# Patient Record
Sex: Female | Born: 1980 | Race: White | Hispanic: No | Marital: Married | Smoking: Never smoker
Health system: Southern US, Community
[De-identification: ages and names within clinical notes are randomized; demographics above are authoritative.]

## PROBLEM LIST (undated history)

## (undated) ENCOUNTER — Inpatient Hospital Stay (HOSPITAL_COMMUNITY): Payer: Self-pay

## (undated) DIAGNOSIS — O24419 Gestational diabetes mellitus in pregnancy, unspecified control: Secondary | ICD-10-CM

## (undated) DIAGNOSIS — E282 Polycystic ovarian syndrome: Secondary | ICD-10-CM

## (undated) DIAGNOSIS — E119 Type 2 diabetes mellitus without complications: Secondary | ICD-10-CM

## (undated) DIAGNOSIS — T7840XA Allergy, unspecified, initial encounter: Secondary | ICD-10-CM

## (undated) DIAGNOSIS — I10 Essential (primary) hypertension: Secondary | ICD-10-CM

## (undated) DIAGNOSIS — N390 Urinary tract infection, site not specified: Secondary | ICD-10-CM

## (undated) DIAGNOSIS — B379 Candidiasis, unspecified: Secondary | ICD-10-CM

## (undated) DIAGNOSIS — M51369 Other intervertebral disc degeneration, lumbar region without mention of lumbar back pain or lower extremity pain: Secondary | ICD-10-CM

## (undated) DIAGNOSIS — M5136 Other intervertebral disc degeneration, lumbar region: Secondary | ICD-10-CM

## (undated) DIAGNOSIS — M549 Dorsalgia, unspecified: Secondary | ICD-10-CM

## (undated) DIAGNOSIS — Q069 Congenital malformation of spinal cord, unspecified: Secondary | ICD-10-CM

## (undated) DIAGNOSIS — Z87442 Personal history of urinary calculi: Secondary | ICD-10-CM

## (undated) DIAGNOSIS — R51 Headache: Secondary | ICD-10-CM

## (undated) DIAGNOSIS — M5126 Other intervertebral disc displacement, lumbar region: Secondary | ICD-10-CM

## (undated) HISTORY — DX: Allergy, unspecified, initial encounter: T78.40XA

## (undated) HISTORY — PX: CHOLECYSTECTOMY: SHX55

## (undated) HISTORY — PX: HERNIA REPAIR: SHX51

## (undated) HISTORY — DX: Polycystic ovarian syndrome: E28.2

## (undated) HISTORY — DX: Type 2 diabetes mellitus without complications: E11.9

## (undated) HISTORY — PX: TONSILLECTOMY: SUR1361

## (undated) HISTORY — PX: WISDOM TOOTH EXTRACTION: SHX21

---

## 2004-03-03 ENCOUNTER — Emergency Department (HOSPITAL_COMMUNITY): Admission: EM | Admit: 2004-03-03 | Discharge: 2004-03-03 | Payer: Self-pay | Admitting: Emergency Medicine

## 2008-12-19 ENCOUNTER — Emergency Department (HOSPITAL_COMMUNITY): Admission: EM | Admit: 2008-12-19 | Discharge: 2008-12-19 | Payer: Self-pay | Admitting: Emergency Medicine

## 2008-12-22 ENCOUNTER — Encounter: Admission: RE | Admit: 2008-12-22 | Discharge: 2008-12-22 | Payer: Self-pay | Admitting: Emergency Medicine

## 2009-06-22 ENCOUNTER — Emergency Department (HOSPITAL_COMMUNITY): Admission: EM | Admit: 2009-06-22 | Discharge: 2009-06-22 | Payer: Self-pay | Admitting: Emergency Medicine

## 2009-07-25 ENCOUNTER — Ambulatory Visit (HOSPITAL_COMMUNITY): Admission: RE | Admit: 2009-07-25 | Discharge: 2009-07-25 | Payer: Self-pay | Admitting: Obstetrics & Gynecology

## 2009-11-29 ENCOUNTER — Emergency Department (HOSPITAL_COMMUNITY): Admission: EM | Admit: 2009-11-29 | Discharge: 2009-11-29 | Payer: Self-pay | Admitting: Emergency Medicine

## 2010-02-18 ENCOUNTER — Inpatient Hospital Stay (HOSPITAL_COMMUNITY): Admission: AD | Admit: 2010-02-18 | Discharge: 2010-02-18 | Payer: Self-pay | Admitting: Obstetrics and Gynecology

## 2010-03-20 ENCOUNTER — Observation Stay: Payer: Self-pay | Admitting: Obstetrics and Gynecology

## 2010-04-21 ENCOUNTER — Encounter (INDEPENDENT_AMBULATORY_CARE_PROVIDER_SITE_OTHER): Payer: Self-pay | Admitting: Obstetrics & Gynecology

## 2010-04-21 ENCOUNTER — Inpatient Hospital Stay (HOSPITAL_COMMUNITY): Admission: AD | Admit: 2010-04-21 | Discharge: 2010-04-23 | Payer: Self-pay | Admitting: Obstetrics & Gynecology

## 2010-06-30 ENCOUNTER — Encounter: Payer: Self-pay | Admitting: Emergency Medicine

## 2010-08-19 LAB — CBC
HCT: 29.4 % — ABNORMAL LOW (ref 36.0–46.0)
HCT: 33.9 % — ABNORMAL LOW (ref 36.0–46.0)
Hemoglobin: 10 g/dL — ABNORMAL LOW (ref 12.0–15.0)
Hemoglobin: 11.6 g/dL — ABNORMAL LOW (ref 12.0–15.0)
MCH: 31.9 pg (ref 26.0–34.0)
MCH: 32.4 pg (ref 26.0–34.0)
MCHC: 34 g/dL (ref 30.0–36.0)
MCHC: 34.2 g/dL (ref 30.0–36.0)
MCV: 93.6 fL (ref 78.0–100.0)
MCV: 94.7 fL (ref 78.0–100.0)
Platelets: 155 10*3/uL (ref 150–400)
Platelets: 164 10*3/uL (ref 150–400)
RBC: 3.1 MIL/uL — ABNORMAL LOW (ref 3.87–5.11)
RBC: 3.62 MIL/uL — ABNORMAL LOW (ref 3.87–5.11)
RDW: 13.9 % (ref 11.5–15.5)
RDW: 14.2 % (ref 11.5–15.5)
WBC: 11 10*3/uL — ABNORMAL HIGH (ref 4.0–10.5)
WBC: 16.6 10*3/uL — ABNORMAL HIGH (ref 4.0–10.5)

## 2010-08-19 LAB — RPR: RPR Ser Ql: NONREACTIVE

## 2010-08-21 LAB — URINALYSIS, ROUTINE W REFLEX MICROSCOPIC
Bilirubin Urine: NEGATIVE
Glucose, UA: NEGATIVE mg/dL
Ketones, ur: NEGATIVE mg/dL
Leukocytes, UA: NEGATIVE
Nitrite: NEGATIVE
Protein, ur: NEGATIVE mg/dL
Specific Gravity, Urine: 1.015 (ref 1.005–1.030)
Urobilinogen, UA: 0.2 mg/dL (ref 0.0–1.0)
pH: 6.5 (ref 5.0–8.0)

## 2010-08-21 LAB — URINE CULTURE
Colony Count: 35000
Culture  Setup Time: 201109140233

## 2010-08-21 LAB — COMPREHENSIVE METABOLIC PANEL
ALT: 17 U/L (ref 0–35)
AST: 15 U/L (ref 0–37)
Albumin: 3 g/dL — ABNORMAL LOW (ref 3.5–5.2)
Alkaline Phosphatase: 92 U/L (ref 39–117)
BUN: 8 mg/dL (ref 6–23)
CO2: 25 mEq/L (ref 19–32)
Calcium: 9.7 mg/dL (ref 8.4–10.5)
Chloride: 102 mEq/L (ref 96–112)
Creatinine, Ser: 0.48 mg/dL (ref 0.4–1.2)
Glucose, Bld: 85 mg/dL (ref 70–99)
Potassium: 3.5 mEq/L (ref 3.5–5.1)
Sodium: 135 mEq/L (ref 135–145)
Total Bilirubin: 0.5 mg/dL (ref 0.3–1.2)
Total Protein: 6.5 g/dL (ref 6.0–8.3)

## 2010-08-21 LAB — FETAL FIBRONECTIN: Fetal Fibronectin: NEGATIVE

## 2010-08-21 LAB — CBC
HCT: 34.5 % — ABNORMAL LOW (ref 36.0–46.0)
Hemoglobin: 11.9 g/dL — ABNORMAL LOW (ref 12.0–15.0)
MCH: 32.2 pg (ref 26.0–34.0)
MCHC: 34.5 g/dL (ref 30.0–36.0)
MCV: 93.4 fL (ref 78.0–100.0)
Platelets: 174 10*3/uL (ref 150–400)
RBC: 3.69 MIL/uL — ABNORMAL LOW (ref 3.87–5.11)
RDW: 14 % (ref 11.5–15.5)
WBC: 10.6 10*3/uL — ABNORMAL HIGH (ref 4.0–10.5)

## 2010-08-21 LAB — URINE MICROSCOPIC-ADD ON

## 2010-08-24 LAB — POCT I-STAT, CHEM 8
BUN: 12 mg/dL (ref 6–23)
Calcium, Ion: 1.1 mmol/L — ABNORMAL LOW (ref 1.12–1.32)
Chloride: 105 mEq/L (ref 96–112)
Creatinine, Ser: 0.6 mg/dL (ref 0.4–1.2)
Glucose, Bld: 110 mg/dL — ABNORMAL HIGH (ref 70–99)
HCT: 42 % (ref 36.0–46.0)
Hemoglobin: 14.3 g/dL (ref 12.0–15.0)
Potassium: 4.1 mEq/L (ref 3.5–5.1)
Sodium: 138 mEq/L (ref 135–145)
TCO2: 27 mmol/L (ref 0–100)

## 2010-08-24 LAB — CBC
HCT: 41.5 % (ref 36.0–46.0)
Hemoglobin: 14.3 g/dL (ref 12.0–15.0)
MCHC: 34.4 g/dL (ref 30.0–36.0)
MCV: 90.5 fL (ref 78.0–100.0)
Platelets: 203 10*3/uL (ref 150–400)
RBC: 4.59 MIL/uL (ref 3.87–5.11)
RDW: 12.6 % (ref 11.5–15.5)
WBC: 9.1 10*3/uL (ref 4.0–10.5)

## 2010-08-24 LAB — DIFFERENTIAL
Basophils Absolute: 0.1 10*3/uL (ref 0.0–0.1)
Basophils Relative: 1 % (ref 0–1)
Eosinophils Absolute: 0.1 10*3/uL (ref 0.0–0.7)
Eosinophils Relative: 1 % (ref 0–5)
Lymphocytes Relative: 24 % (ref 12–46)
Lymphs Abs: 2.2 10*3/uL (ref 0.7–4.0)
Monocytes Absolute: 0.5 10*3/uL (ref 0.1–1.0)
Monocytes Relative: 6 % (ref 3–12)
Neutro Abs: 6.2 10*3/uL (ref 1.7–7.7)
Neutrophils Relative %: 68 % (ref 43–77)

## 2010-08-24 LAB — URINALYSIS, ROUTINE W REFLEX MICROSCOPIC
Bilirubin Urine: NEGATIVE
Glucose, UA: NEGATIVE mg/dL
Ketones, ur: NEGATIVE mg/dL
Leukocytes, UA: NEGATIVE
Nitrite: NEGATIVE
Protein, ur: NEGATIVE mg/dL
Specific Gravity, Urine: 1.027 (ref 1.005–1.030)
Urobilinogen, UA: 0.2 mg/dL (ref 0.0–1.0)
pH: 6 (ref 5.0–8.0)

## 2010-08-24 LAB — URINE MICROSCOPIC-ADD ON

## 2010-08-24 LAB — HCG, QUANTITATIVE, PREGNANCY: hCG, Beta Chain, Quant, S: <2 m[IU]/mL (ref <5)

## 2010-08-24 LAB — ABO/RH: ABO/RH(D): A POS

## 2010-08-24 LAB — POCT PREGNANCY, URINE: Preg Test, Ur: NEGATIVE

## 2011-03-09 ENCOUNTER — Emergency Department: Payer: Self-pay | Admitting: Emergency Medicine

## 2011-03-10 ENCOUNTER — Emergency Department: Payer: Self-pay | Admitting: Emergency Medicine

## 2012-09-01 ENCOUNTER — Emergency Department: Payer: Self-pay | Admitting: Emergency Medicine

## 2012-09-01 LAB — URINALYSIS, COMPLETE
Bilirubin,UR: NEGATIVE
Glucose,UR: NEGATIVE mg/dL (ref 0–75)
Ketone: NEGATIVE
Leukocyte Esterase: NEGATIVE
Nitrite: POSITIVE
Ph: 6 (ref 4.5–8.0)
Protein: NEGATIVE
RBC,UR: 63 /HPF (ref 0–5)
Specific Gravity: 1.026 (ref 1.003–1.030)
Squamous Epithelial: 11
WBC UR: 14 /HPF (ref 0–5)

## 2012-09-01 LAB — PREGNANCY, URINE: Pregnancy Test, Urine: NEGATIVE m[IU]/mL

## 2012-12-19 ENCOUNTER — Inpatient Hospital Stay (HOSPITAL_COMMUNITY)
Admission: AD | Admit: 2012-12-19 | Discharge: 2012-12-19 | Disposition: A | Discharge status: Home / Self Care | Payer: BC Managed Care – PPO | Source: Ambulatory Visit | Attending: Obstetrics and Gynecology | Admitting: Obstetrics and Gynecology

## 2012-12-19 ENCOUNTER — Encounter (HOSPITAL_COMMUNITY): Payer: Self-pay

## 2012-12-19 DIAGNOSIS — O219 Vomiting of pregnancy, unspecified: Secondary | ICD-10-CM

## 2012-12-19 DIAGNOSIS — O210 Mild hyperemesis gravidarum: Secondary | ICD-10-CM | POA: Insufficient documentation

## 2012-12-19 HISTORY — DX: Congenital malformation of spinal cord, unspecified: Q06.9

## 2012-12-19 HISTORY — DX: Candidiasis, unspecified: B37.9

## 2012-12-19 HISTORY — DX: Gestational diabetes mellitus in pregnancy, unspecified control: O24.419

## 2012-12-19 HISTORY — DX: Urinary tract infection, site not specified: N39.0

## 2012-12-19 LAB — URINALYSIS, ROUTINE W REFLEX MICROSCOPIC
Bilirubin Urine: NEGATIVE
Glucose, UA: NEGATIVE mg/dL
Ketones, ur: NEGATIVE mg/dL
Nitrite: NEGATIVE
Protein, ur: NEGATIVE mg/dL
Specific Gravity, Urine: 1.02 (ref 1.005–1.030)
Urobilinogen, UA: 1 mg/dL (ref 0.0–1.0)
pH: 6.5 (ref 5.0–8.0)

## 2012-12-19 LAB — URINE MICROSCOPIC-ADD ON

## 2012-12-19 MED ORDER — PROMETHAZINE HCL 25 MG RE SUPP: 25.0000 mg | Freq: Four times a day (QID) | RECTAL | Status: DC | PRN

## 2012-12-19 NOTE — MAU Note (Signed)
Patient presents to MAU with c/o unrelieved N/V since last Monday. Zofran/Phenergan not helping. Patient reports she is on Bactrim for UTI, and is concerned she is not keeping down meds.  Denies VB, LOF, cramping.

## 2012-12-19 NOTE — MAU Provider Note (Signed)
History     CSN: [id number]  Arrival date and time: 12/19/12 1157   None     Chief Complaint  Patient presents with  . Emesis   HPI Amber Foster is 32 y.o. G3P2002 103w6d weeks presenting with nausea and vomiting at [redacted]w[redacted]d gestation.  She is a patient of Dr. Langston Foster.  She was seen last 7/11 in the office.  Given Rx for Zofran and Macrobid.  Has vomited X 4 today.  She is concerned that the antibiotic is not staying down.  The vomiting doesn't necessarily occur with taking the medications,rather at random times of the day.   Vomited X 8 Friday, 2X Saturday, and didn't allow herself to vomit.  Mild headache.  Denis vaginal sxs.  Culture pending.  She called the office and was instructed to come here for evaluation.  She has had Ultrasound in the office that showed an IUP.  Dr. Marcelle Overlie is in call.      No past medical history on file.  No past surgical history on file.  No family history on file.  History  Substance Use Topics  . Smoking status: Not on file  . Smokeless tobacco: Not on file  . Alcohol Use: Not on file    Allergies: Allergies not on file  No prescriptions prior to admission    Review of Systems  Constitutional: Negative for fever and chills.  Gastrointestinal: Positive for nausea and vomiting. Negative for abdominal pain.  Genitourinary:       Neg for vaginal bleeding or discharge  Neurological: Positive for headaches (mild).   Physical Exam   Blood pressure 122/59, pulse 58, temperature 98.4 F (36.9 C), temperature source Oral, resp. rate 16, height 5' 3.5" (1.613 m), weight 166 lb 12.8 oz (75.66 kg), SpO2 100.00%.  Physical Exam  Constitutional: She is oriented to person, place, and time. She appears well-developed and well-nourished. No distress.  HENT:  Head: Normocephalic.  Genitourinary:  Not indicated  Neurological: She is alert and oriented to person, place, and time.  Psychiatric: She has a normal mood and affect. Her behavior is normal.    Results for orders placed during the hospital encounter of 12/19/12 (from the past 24 hour(s))  URINALYSIS, ROUTINE W REFLEX MICROSCOPIC     Status: Abnormal   Collection Time    12/19/12 12:26 PM      Result Value Range   Color, Urine YELLOW  YELLOW   APPearance CLEAR  CLEAR   Specific Gravity, Urine 1.020  1.005 - 1.030   pH 6.5  5.0 - 8.0   Glucose, UA NEGATIVE  NEGATIVE mg/dL   Hgb urine dipstick MODERATE (*) NEGATIVE   Bilirubin Urine NEGATIVE  NEGATIVE   Ketones, ur NEGATIVE  NEGATIVE mg/dL   Protein, ur NEGATIVE  NEGATIVE mg/dL   Urobilinogen, UA 1.0  0.0 - 1.0 mg/dL   Nitrite NEGATIVE  NEGATIVE   Leukocytes, UA TRACE (*) NEGATIVE  URINE MICROSCOPIC-ADD ON     Status: Abnormal   Collection Time    12/19/12 12:26 PM      Result Value Range   Squamous Epithelial / LPF FEW (*) RARE   WBC, UA 0-2  <3 WBC/hpf   RBC / HPF 3-6  <3 RBC/hpf   Urine-Other MUCOUS PRESENT     MAU Course  Procedures  MDM 13:00  Reported MSE and UA results to Dr. Marcelle Overlie.  I asked if he would check on the urine culture the office sent on Friday.  Her SG  and Ketones are normal.  He will call back with this information. 1 13:03  Informed patient of lab findings and that when Dr. Marcelle Overlie calls back, we will have a plan.  14:40  Dr. Marcelle Overlie called back to report Urine culture was + for E. Coli.  Offered IV ancef vs completion of Macrodantin.  I discussed the options with the patient.  She does not have Phenergan at home, only Zofran.  She states now that it is difficult to work and go to school with the nausea.  Her Mother-in-law and husband are home during the day and can help with her 2 young sons.  Suggested she try Phenergan to control nausea first.  If this doesn't help, may need IV antibiotic.  Instructed patient to talk to Dr. Langston Foster about work and school.  Offered work note for today, she is off today and does not need it.    Assessment and Plan  A:  Nausea and vomiting at [redacted]w[redacted]d       + urine  culture for E. COli resulted today in the office  P:  Encouraged patient to complete Macrodantin      Rx for Phenergan 25mg  suppositories to her pharmacy      Instructed to call Dr. Langston Foster for sxs that do not improve with addition of Phenergan  [name],EVE M 12/19/2012, 12:26 PM

## 2012-12-19 NOTE — MAU Note (Signed)
Patient states she has had her first prenatal appointment with [facility]. Has had nausea and vomiting for one week. Unable to keep anything down today. Has been taking Zofran but not working. Only has abdominal pain with vomiting. Denies bleeding or discharge. Extremely weak.

## 2012-12-26 LAB — OB RESULTS CONSOLE ABO/RH: RH Type: POSITIVE

## 2012-12-26 LAB — OB RESULTS CONSOLE HIV ANTIBODY (ROUTINE TESTING): HIV: NONREACTIVE

## 2012-12-26 LAB — OB RESULTS CONSOLE RPR: RPR: NONREACTIVE

## 2012-12-26 LAB — OB RESULTS CONSOLE GC/CHLAMYDIA
Chlamydia: NEGATIVE
Gonorrhea: NEGATIVE

## 2012-12-26 LAB — OB RESULTS CONSOLE ANTIBODY SCREEN: Antibody Screen: NEGATIVE

## 2012-12-26 LAB — OB RESULTS CONSOLE RUBELLA ANTIBODY, IGM: Rubella: IMMUNE

## 2012-12-26 LAB — OB RESULTS CONSOLE HEPATITIS B SURFACE ANTIGEN: Hepatitis B Surface Ag: NEGATIVE

## 2013-01-17 ENCOUNTER — Encounter (HOSPITAL_COMMUNITY): Payer: Self-pay | Admitting: *Deleted

## 2013-01-17 ENCOUNTER — Inpatient Hospital Stay (HOSPITAL_COMMUNITY)
Admission: AD | Admit: 2013-01-17 | Discharge: 2013-01-17 | Disposition: A | Discharge status: Home / Self Care | Payer: BC Managed Care – PPO | Source: Ambulatory Visit | Attending: Obstetrics and Gynecology | Admitting: Obstetrics and Gynecology

## 2013-01-17 DIAGNOSIS — O219 Vomiting of pregnancy, unspecified: Secondary | ICD-10-CM

## 2013-01-17 DIAGNOSIS — O210 Mild hyperemesis gravidarum: Secondary | ICD-10-CM | POA: Insufficient documentation

## 2013-01-17 DIAGNOSIS — R51 Headache: Secondary | ICD-10-CM | POA: Insufficient documentation

## 2013-01-17 HISTORY — DX: Headache: R51

## 2013-01-17 LAB — URINALYSIS, ROUTINE W REFLEX MICROSCOPIC
Bilirubin Urine: NEGATIVE
Glucose, UA: NEGATIVE mg/dL
Ketones, ur: NEGATIVE mg/dL
Nitrite: NEGATIVE
Protein, ur: NEGATIVE mg/dL
Specific Gravity, Urine: 1.02 (ref 1.005–1.030)
Urobilinogen, UA: 1 mg/dL (ref 0.0–1.0)
pH: 7 (ref 5.0–8.0)

## 2013-01-17 LAB — URINE MICROSCOPIC-ADD ON

## 2013-01-17 MED ORDER — ONDANSETRON HCL 4 MG PO TABS: 8.0000 mg | ORAL_TABLET | Freq: Four times a day (QID) | ORAL | Status: DC

## 2013-01-17 MED ORDER — BUTORPHANOL TARTRATE 1 MG/ML IJ SOLN
1.0000 mg | Freq: Once | INTRAMUSCULAR | Status: AC
  Administered 2013-01-17: 1 mg via INTRAVENOUS
  Filled 2013-01-17: qty 1

## 2013-01-17 MED ORDER — LACTATED RINGERS IV BOLUS (SEPSIS)
1000.0000 mL | Freq: Once | INTRAVENOUS | Status: AC
  Administered 2013-01-17: 1000 mL via INTRAVENOUS

## 2013-01-17 MED ORDER — ONDANSETRON 8 MG/NS 50 ML IVPB
8.0000 mg | Freq: Once | INTRAVENOUS | Status: AC
  Administered 2013-01-17: 8 mg via INTRAVENOUS
  Filled 2013-01-17: qty 8

## 2013-01-17 NOTE — MAU Provider Note (Signed)
History     CSN: [id number]  Arrival date and time: 01/17/13 1143   First Provider Initiated Contact with Patient 01/17/13 1240      Chief Complaint  Patient presents with  . Headache   HPI Amber Foster is a 32 y.o. G3P2002 at [redacted]w[redacted]d who presents to MAU today with complaint of N/V and headache. The patient states headache x 4 days. Pain is rated at 4/10 now, worse when standing. N/V has been consistent most of the pregnancy. She has tried Imitrol, Phenergan, Diclegis and Zofran. She has adverse reactions to all but Zofran and her insurance won't cover Zofran. She had 4 episodes of emesis yesterday, but non today. She denies any PO intake today. She denies fever, UTI symptoms, vaginal bleeding, discharge or abdominal pain.   OB History   Grav Para Term Preterm Abortions TAB SAB Ect Mult Living   3 2 2       2       Past Medical History  Diagnosis Date  . Gestational diabetes     during 2nd pregnancy after gallbladder removed  . UTI (lower urinary tract infection)   . Yeast infection   . Congenital anomaly of spinal meninges   . [id number](478.2)     Past Surgical History  Procedure Laterality Date  . Cholecystectomy    . Wisdom tooth extraction    . Tonsillectomy      History reviewed. No pertinent family history.  History  Substance Use Topics  . Smoking status: Never Smoker   . Smokeless tobacco: Never Used  . Alcohol Use: No    Allergies: No Known Allergies  Prescriptions prior to admission  Medication Sig Dispense Refill  . acetaminophen (TYLENOL) 325 MG tablet Take 650 mg by mouth every 8 (eight) hours as needed for pain.      . Prenatal Vit-Fe Fumarate-FA (PRENATAL MULTIVITAMIN) TABS tablet Take 1 tablet by mouth daily at 12 noon.        Review of Systems  Constitutional: Positive for malaise/fatigue. Negative for fever.  Eyes: Negative for blurred vision.  Gastrointestinal: Positive for nausea and vomiting. Negative for abdominal pain, diarrhea and  constipation.  Genitourinary: Negative for dysuria, urgency and frequency.       Neg - vaginal bleeding, discharge  Neurological: Positive for dizziness, weakness and headaches. Negative for loss of consciousness.   Physical Exam   Blood pressure 117/70, pulse 72, temperature 98.1 F (36.7 C), temperature source Oral, resp. rate 18, height 5\' 3"  (1.6 m), weight 162 lb (73.483 kg).  Physical Exam  Constitutional: She is oriented to person, place, and time. She appears well-developed and well-nourished. No distress.  HENT:  Head: Normocephalic and atraumatic.  Cardiovascular: Normal rate, regular rhythm and normal heart sounds.   Respiratory: Effort normal and breath sounds normal. No respiratory distress.  GI: Soft. Bowel sounds are normal. She exhibits no distension and no mass. There is no tenderness. There is no rebound and no guarding.  Neurological: She is alert and oriented to person, place, and time.  Skin: Skin is warm and dry. No erythema.  Psychiatric: She has a normal mood and affect.   Results for orders placed during the hospital encounter of 01/17/13 (from the past 24 hour(s))  URINALYSIS, ROUTINE W REFLEX MICROSCOPIC     Status: Abnormal   Collection Time    01/17/13 12:27 PM      Result Value Range   Color, Urine YELLOW  YELLOW   APPearance CLEAR  CLEAR  Specific Gravity, Urine 1.020  1.005 - 1.030   pH 7.0  5.0 - 8.0   Glucose, UA NEGATIVE  NEGATIVE mg/dL   Hgb urine dipstick MODERATE (*) NEGATIVE   Bilirubin Urine NEGATIVE  NEGATIVE   Ketones, ur NEGATIVE  NEGATIVE mg/dL   Protein, ur NEGATIVE  NEGATIVE mg/dL   Urobilinogen, UA 1.0  0.0 - 1.0 mg/dL   Nitrite NEGATIVE  NEGATIVE   Leukocytes, UA TRACE (*) NEGATIVE  URINE MICROSCOPIC-ADD ON     Status: Abnormal   Collection Time    01/17/13 12:27 PM      Result Value Range   Squamous Epithelial / LPF RARE  RARE   WBC, UA 0-2  <3 WBC/hpf   RBC / HPF 7-10  <3 RBC/hpf   Bacteria, UA FEW (*) RARE    MAU  Course  Procedures None  MDM Discussed with [place]. He would like 1 liter IV fluids with Zofran for nausea and Stadol for headache.  Patient reports headache pain is now 1/10 Nausea has resolved. Patient is able to tolerate PO in MAU Discussed patient with Case Management (Traci) and she has made some phone calls to [facility] and the [facility]. Patient will be able to fill Rx today for #12 Zofran 8 mg for $10. Patient has been given contact number for [facility] as well as Pharmacy ID#'s to call in to [facility] tomorrow morning. With this approval patient may be able to get refills of Rx in the future. Patient voices understanding and will call office first thing in the morning. Case Manager from [facility] plans to follow-up with [name], RN at [facility] in the morning as well.  Assessment and Plan  A: Nausea and vomiting in pregnancy prior to [redacted] weeks gestation  P: Discharge home Rx for Zofran given to patient Patient advised to call office in the morning for follow-up on Rx Patient may return to MAU as needed or if her condition were to change or worsen  Freddi Starr, PA-C  01/17/2013, 4:37 PM

## 2013-01-17 NOTE — Progress Notes (Signed)
DC instructions reviewed, pt verbalizes understanding.  Unable to use e-signature, states patient record being used elsewhere.

## 2013-01-17 NOTE — MAU Note (Signed)
Ongoing headache, 4th day with no let up.  Has had migraines, never lasted more than a day and this doesn't seem like a migraine.  Feeling dizzy, nauseated.  Can't keep anything down.

## 2013-01-18 NOTE — Care Management Note (Addendum)
    Page 1 of 1   01/18/2013     10:25:38 AM   CARE MANAGEMENT NOTE 01/18/2013  Patient:  Amber Foster, Amber Foster   Account Number:  [id number]  Date Initiated:  01/17/2013  Documentation initiated by:  Roseanne Reno  Subjective/Objective Assessment:   [redacted] wks gestation w/ headache nausea and vomiting.     Action/Plan:   Has tried Imitrol, Phenergan, Diclegis and Zofran. She has adverse reactions to all but Zofran and her insurance won't cover Zofran.   Anticipated DC Date:  01/17/2013   Anticipated DC Plan:  HOME/SELF CARE      DC Planning Services  CM consult      Discharge Disposition:  HOME/SELF CARE  Comments:  01/18/13  950a  Called Dr. Huel [employer] Nurse - [name] [phone], explained the issues w/ the Zofran and needing prior authorization.  Gave her the 800 number and she will follow up.  TJohnson, RNBSN   01/17/13  1615p  Call from MAU regarding pt's need for Zofran and insurance will not cover it.  Takes 4 mg q 4 hrs.  Per benefits check through [facility] [phone] - 30 day supply of Zofran is $3,871.62, 30 day supply of Ondansetron is $10.00.  Her Primary Care MD OB/GYN  will need to call for authorization - [phone].  Per Arlys John at [facility] they can provide Zofran 4 or 8 mg tabs x 12 every 15 days.  If pt gets 8mg  tabs and cut them in half then that would be 24 tabs and allow time for the Primary Care OB/GYN office to call for authorization. Stressed importance of Primary Care MD OB/GYN calling first thing on 01/18/13.  Raynelle Fanning in MAU will relay this info to the pt.  CM available to assist as needed.  TJohnson, RNBSN [phone]

## 2013-01-18 NOTE — Progress Notes (Signed)
   CARE MANAGEMENT NOTE 01/18/2013  Patient:  Amber Foster, Amber Foster   Account Number:  [id number]  Date Initiated:  01/17/2013  Documentation initiated by:  Roseanne Reno  Subjective/Objective Assessment:   [redacted] wks gestation w/ headache nausea and vomiting.     Action/Plan:   Has tried Imitrol, Phenergan, Diclegis and Zofran. She has adverse reactions to all but Zofran and her insurance won't cover Zofran.   Anticipated DC Date:  01/17/2013   Anticipated DC Plan:  HOME/SELF CARE      DC Planning Services  CM consult      Discharge Disposition:  HOME/SELF CARE  Comments:  01/18/13  950a  Called Dr. Huel [employer] Nurse - [name] [phone], explained the issues w/ the Zofran and needing prior authorization.  Gave her the 800 number and she will follow up.  TJohnson, RNBSN   01/17/13  1615p  Call from MAU regarding pt's need for Zofran and insurance will not cover it.  Takes 4 mg q 4 hrs.  Per benefits check through [facility] [phone] - 30 day supply of Zofran is $3,871.62, 30 day supply of Ondansetron is $10.00.  Her Primary Care MD OB/GYN  will need to call for authorization - [phone].  Per Arlys John at [facility] they can provide Zofran 4 or 8 mg tabs x 12 every 15 days.  If pt gets 8mg  tabs and cut them in half then that would be 24 tabs and allow time for the Primary Care OB/GYN office to call for authorization. Stressed importance of Primary Care MD OB/GYN calling first thing on 01/18/13.  Raynelle Fanning in MAU will relay this info to the pt.  CM available to assist as needed.  TJohnson, RNBSN [phone]

## 2013-06-08 NOTE — L&D Delivery Note (Signed)
Delivery Note Called to the room to standby for rapidly progressing labor. Fetal heat at +3 before any pushing efforts began. Dr. Renaldo FiddlerAdkins en route to the hospital. At 3:48 PM a viable female was delivered via Vaginal, Spontaneous Delivery (Presentation: ; Occiput Anterior).  APGAR: , ; weight: pending  .   Placenta status: Intact, Spontaneous.  Cord: 3 vessels with the following complications: None.    Anesthesia: None  Episiotomy: None Lacerations:  Suture Repair: pending  Est. Blood Loss (mL):   Mom to postpartum.  Baby to Couplet care / Skin to Skin.  Amber Foster, Amber Foster 07/28/2013, 3:57 PM

## 2013-07-03 LAB — OB RESULTS CONSOLE GBS: GBS: POSITIVE

## 2013-07-25 ENCOUNTER — Encounter (HOSPITAL_COMMUNITY): Payer: Self-pay | Admitting: *Deleted

## 2013-07-25 ENCOUNTER — Telehealth (HOSPITAL_COMMUNITY): Payer: Self-pay | Admitting: *Deleted

## 2013-07-25 NOTE — Telephone Encounter (Signed)
Preadmission screen  

## 2013-07-27 ENCOUNTER — Inpatient Hospital Stay (HOSPITAL_COMMUNITY): Admission: RE | Admit: 2013-07-27 | Payer: BC Managed Care – PPO | Source: Ambulatory Visit

## 2013-07-28 ENCOUNTER — Encounter (HOSPITAL_COMMUNITY): Payer: Self-pay

## 2013-07-28 ENCOUNTER — Inpatient Hospital Stay (HOSPITAL_COMMUNITY)
Admission: RE | Admit: 2013-07-28 | Discharge: 2013-07-29 | DRG: 774 | Disposition: A | Discharge status: Home / Self Care | Payer: Medicaid Other | Source: Ambulatory Visit | Attending: Obstetrics and Gynecology | Admitting: Obstetrics and Gynecology

## 2013-07-28 DIAGNOSIS — O2432 Unspecified pre-existing diabetes mellitus in childbirth: Principal | ICD-10-CM | POA: Diagnosis present

## 2013-07-28 DIAGNOSIS — E119 Type 2 diabetes mellitus without complications: Secondary | ICD-10-CM | POA: Diagnosis present

## 2013-07-28 DIAGNOSIS — Z8632 Personal history of gestational diabetes: Secondary | ICD-10-CM | POA: Diagnosis present

## 2013-07-28 DIAGNOSIS — O9989 Other specified diseases and conditions complicating pregnancy, childbirth and the puerperium: Secondary | ICD-10-CM

## 2013-07-28 DIAGNOSIS — O99892 Other specified diseases and conditions complicating childbirth: Secondary | ICD-10-CM | POA: Diagnosis present

## 2013-07-28 DIAGNOSIS — Z22330 Carrier of Group B streptococcus: Secondary | ICD-10-CM

## 2013-07-28 DIAGNOSIS — Z794 Long term (current) use of insulin: Secondary | ICD-10-CM

## 2013-07-28 LAB — TYPE AND SCREEN
ABO/RH(D): A POS
Antibody Screen: NEGATIVE

## 2013-07-28 LAB — CBC
HCT: 37.9 % (ref 36.0–46.0)
Hemoglobin: 13 g/dL (ref 12.0–15.0)
MCH: 30.4 pg (ref 26.0–34.0)
MCHC: 34.3 g/dL (ref 30.0–36.0)
MCV: 88.8 fL (ref 78.0–100.0)
Platelets: 194 10*3/uL (ref 150–400)
RBC: 4.27 MIL/uL (ref 3.87–5.11)
RDW: 13.2 % (ref 11.5–15.5)
WBC: 9.9 10*3/uL (ref 4.0–10.5)

## 2013-07-28 LAB — ABO/RH: ABO/RH(D): A POS

## 2013-07-28 LAB — RPR: RPR Ser Ql: NONREACTIVE

## 2013-07-28 MED ORDER — CITRIC ACID-SODIUM CITRATE 334-500 MG/5ML PO SOLN: 30.0000 mL | ORAL | Status: DC | PRN

## 2013-07-28 MED ORDER — ACETAMINOPHEN 325 MG PO TABS: 650.0000 mg | ORAL_TABLET | ORAL | Status: DC | PRN

## 2013-07-28 MED ORDER — LANOLIN HYDROUS EX OINT: TOPICAL_OINTMENT | CUTANEOUS | Status: DC | PRN

## 2013-07-28 MED ORDER — LIDOCAINE HCL (PF) 1 % IJ SOLN
30.0000 mL | INTRAMUSCULAR | Status: DC | PRN
  Administered 2013-07-28: 30 mL via SUBCUTANEOUS
  Filled 2013-07-28: qty 30

## 2013-07-28 MED ORDER — DIBUCAINE 1 % RE OINT: 1.0000 "application " | TOPICAL_OINTMENT | RECTAL | Status: DC | PRN

## 2013-07-28 MED ORDER — OXYTOCIN 40 UNITS IN LACTATED RINGERS INFUSION - SIMPLE MED: 62.5000 mL/h | INTRAVENOUS | Status: DC

## 2013-07-28 MED ORDER — OXYTOCIN BOLUS FROM INFUSION: 500.0000 mL | INTRAVENOUS | Status: DC

## 2013-07-28 MED ORDER — LACTATED RINGERS IV SOLN
INTRAVENOUS | Status: DC
  Administered 2013-07-28 (×2): via INTRAVENOUS

## 2013-07-28 MED ORDER — IBUPROFEN 600 MG PO TABS
600.0000 mg | ORAL_TABLET | Freq: Four times a day (QID) | ORAL | Status: DC
  Administered 2013-07-28 – 2013-07-29 (×3): 600 mg via ORAL
  Filled 2013-07-28 (×3): qty 1

## 2013-07-28 MED ORDER — LACTATED RINGERS IV SOLN: 500.0000 mL | INTRAVENOUS | Status: DC | PRN

## 2013-07-28 MED ORDER — PRENATAL MULTIVITAMIN CH
1.0000 | ORAL_TABLET | Freq: Every day | ORAL | Status: DC
  Administered 2013-07-29: 1 via ORAL
  Filled 2013-07-28: qty 1

## 2013-07-28 MED ORDER — DIPHENHYDRAMINE HCL 25 MG PO CAPS
25.0000 mg | ORAL_CAPSULE | Freq: Four times a day (QID) | ORAL | Status: DC | PRN
Start: 2013-07-28 — End: 2013-07-29

## 2013-07-28 MED ORDER — OXYTOCIN 40 UNITS IN LACTATED RINGERS INFUSION - SIMPLE MED
1.0000 m[IU]/min | INTRAVENOUS | Status: DC
  Administered 2013-07-28: 2 m[IU]/min via INTRAVENOUS
  Filled 2013-07-28: qty 1000

## 2013-07-28 MED ORDER — IBUPROFEN 600 MG PO TABS
600.0000 mg | ORAL_TABLET | Freq: Four times a day (QID) | ORAL | Status: DC | PRN
  Administered 2013-07-28: 600 mg via ORAL
  Filled 2013-07-28: qty 1

## 2013-07-28 MED ORDER — MEDROXYPROGESTERONE ACETATE 150 MG/ML IM SUSP: 150.0000 mg | INTRAMUSCULAR | Status: DC | PRN

## 2013-07-28 MED ORDER — PENICILLIN G POTASSIUM 5000000 UNITS IJ SOLR
5.0000 10*6.[IU] | Freq: Once | INTRAVENOUS | Status: AC
  Administered 2013-07-28: 5 10*6.[IU] via INTRAVENOUS
  Filled 2013-07-28: qty 5

## 2013-07-28 MED ORDER — BENZOCAINE-MENTHOL 20-0.5 % EX AERO
1.0000 "application " | INHALATION_SPRAY | CUTANEOUS | Status: DC | PRN
  Administered 2013-07-28: 1 via TOPICAL
  Filled 2013-07-28: qty 56

## 2013-07-28 MED ORDER — ONDANSETRON HCL 4 MG/2ML IJ SOLN: 4.0000 mg | Freq: Four times a day (QID) | INTRAMUSCULAR | Status: DC | PRN

## 2013-07-28 MED ORDER — MEASLES, MUMPS & RUBELLA VAC ~~LOC~~ INJ: 0.5000 mL | INJECTION | Freq: Once | SUBCUTANEOUS | Status: DC

## 2013-07-28 MED ORDER — OXYCODONE-ACETAMINOPHEN 5-325 MG PO TABS
1.0000 | ORAL_TABLET | ORAL | Status: DC | PRN
  Administered 2013-07-28: 2 via ORAL
  Filled 2013-07-28: qty 2

## 2013-07-28 MED ORDER — TERBUTALINE SULFATE 1 MG/ML IJ SOLN: 0.2500 mg | Freq: Once | INTRAMUSCULAR | Status: DC | PRN

## 2013-07-28 MED ORDER — ONDANSETRON HCL 4 MG PO TABS: 4.0000 mg | ORAL_TABLET | ORAL | Status: DC | PRN

## 2013-07-28 MED ORDER — PENICILLIN G POTASSIUM 5000000 UNITS IJ SOLR
2.5000 10*6.[IU] | INTRAVENOUS | Status: DC
  Administered 2013-07-28: 2.5 10*6.[IU] via INTRAVENOUS
  Filled 2013-07-28 (×5): qty 2.5

## 2013-07-28 MED ORDER — SIMETHICONE 80 MG PO CHEW: 80.0000 mg | CHEWABLE_TABLET | ORAL | Status: DC | PRN

## 2013-07-28 MED ORDER — SENNOSIDES-DOCUSATE SODIUM 8.6-50 MG PO TABS
2.0000 | ORAL_TABLET | ORAL | Status: DC
  Administered 2013-07-28: 2 via ORAL
  Filled 2013-07-28: qty 2

## 2013-07-28 MED ORDER — TETANUS-DIPHTH-ACELL PERTUSSIS 5-2.5-18.5 LF-MCG/0.5 IM SUSP: 0.5000 mL | Freq: Once | INTRAMUSCULAR | Status: DC

## 2013-07-28 MED ORDER — ONDANSETRON HCL 4 MG/2ML IJ SOLN: 4.0000 mg | INTRAMUSCULAR | Status: DC | PRN

## 2013-07-28 MED ORDER — WITCH HAZEL-GLYCERIN EX PADS
1.0000 "application " | MEDICATED_PAD | CUTANEOUS | Status: DC | PRN
  Administered 2013-07-28: 1 via TOPICAL

## 2013-07-28 NOTE — Progress Notes (Signed)
Pt getting uncomfortable w/ ctx FHT reassuring w/ early decels Toco Q2 Cvx 4/90/-2  A/P:  Exp mngt

## 2013-07-28 NOTE — Progress Notes (Signed)
Arrived to room after delivery of infant Upon arrival, infant stable on mom - skin to skin Placenta was then delivered by me spontaneously First degree & right periurethral laceration repaired with 3-o vicryl rapide EBL 400cc Fundus firm, hemostatic

## 2013-07-28 NOTE — H&P (Signed)
Amber Foster is a 33 y.o. female presenting for IOL.  Pregnancy complicated by A2DM.    History OB History   Grav Para Term Preterm Abortions TAB SAB Ect Mult Living   3 2 2       2      Past Medical History  Diagnosis Date  . UTI (lower urinary tract infection)   . Yeast infection   . Congenital anomaly of spinal meninges   . Headache(784.0)   . Gestational diabetes     during 2nd pregnancy after gallbladder removed   Past Surgical History  Procedure Laterality Date  . Cholecystectomy    . Wisdom tooth extraction    . Tonsillectomy     Family History: family history includes Bipolar disorder in her sister; Cancer in her paternal grandfather; Hypertension in her mother; Seizures in her sister. Social History:  reports that she has never smoked. She has never used smokeless tobacco. She reports that she does not drink alcohol or use illicit drugs.   Prenatal Transfer Tool  Maternal Diabetes: Yes:  Diabetes Type:  Insulin/Medication controlled Genetic Screening: Normal Maternal Ultrasounds/Referrals: Normal Fetal Ultrasounds or other Referrals:  None Maternal Substance Abuse:  No Significant Maternal Medications:  glyburide Significant Maternal Lab Results:  None Other Comments:  None  ROS  Dilation: 3 Effacement (%): 60 Station: -2 Exam by:: Amber Foster RNC Blood pressure 113/58, pulse 61, temperature 96.7 F (35.9 C), temperature source Axillary, resp. rate 16, height 5\' 4"  (1.626 m), weight 71.668 kg (158 lb), last menstrual period 10/20/2012. Exam Physical Exam  Gen - NAD Abd - gravid, NT Ext - NT PV - 2cm on admit AROM , clear Prenatal labs: ABO, Rh: --/--/A POS (02/20 0755) Antibody: NEG (02/20 0755) Rubella: Immune (07/21 0000) RPR: Nonreactive (07/21 0000)  HBsAg: Negative (07/21 0000)  HIV: Non-reactive (07/21 0000)  GBS: Positive (01/26 0000)   Assessment/Plan: Admit Pitocin PCN    Amber Foster 07/28/2013, 2:22 PM

## 2013-07-29 LAB — CBC
HCT: 32 % — ABNORMAL LOW (ref 36.0–46.0)
Hemoglobin: 10.8 g/dL — ABNORMAL LOW (ref 12.0–15.0)
MCH: 30.1 pg (ref 26.0–34.0)
MCHC: 33.8 g/dL (ref 30.0–36.0)
MCV: 89.1 fL (ref 78.0–100.0)
Platelets: 188 10*3/uL (ref 150–400)
RBC: 3.59 MIL/uL — ABNORMAL LOW (ref 3.87–5.11)
RDW: 13.1 % (ref 11.5–15.5)
WBC: 13.2 10*3/uL — ABNORMAL HIGH (ref 4.0–10.5)

## 2013-07-29 MED ORDER — IBUPROFEN 600 MG PO TABS: 600.0000 mg | ORAL_TABLET | Freq: Four times a day (QID) | ORAL | Status: DC

## 2013-07-29 MED ORDER — OXYCODONE-ACETAMINOPHEN 5-325 MG PO TABS: 1.0000 | ORAL_TABLET | ORAL | Status: DC | PRN

## 2013-07-29 NOTE — Discharge Summary (Signed)
Obstetric Discharge Summary Reason for Admission: induction of labor Prenatal Procedures: ultrasound Intrapartum Procedures: spontaneous vaginal delivery Postpartum Procedures: none Complications-Operative and Postpartum:   degree perineal laceration Hemoglobin  Date Value Ref Range Status  07/29/2013 10.8* 12.0 - 15.0 g/dL Final     REPEATED TO VERIFY     DELTA CHECK NOTED     HCT  Date Value Ref Range Status  07/29/2013 32.0* 36.0 - 46.0 % Final    Physical Exam:  General: alert and cooperative Lochia: appropriate Uterine Fundus: firm Incision: n/a DVT Evaluation: No evidence of DVT seen on physical exam.  Discharge Diagnoses: Term Pregnancy-delivered  Discharge Information: Date: 07/29/2013 Activity: pelvic rest Diet: routine Medications: PNV, Ibuprofen and Percocet Condition: stable Instructions: refer to practice specific booklet Discharge to: home Follow-up Information   Follow up In 6 weeks.      Newborn Data: Live born female  Birth Weight: 6 lb 6.3 oz (2900 g) APGAR: 8, 9  Home with mother.  Amber Foster 07/29/2013, 7:36 AM

## 2013-07-29 NOTE — Lactation Note (Signed)
This note was copied from the chart of Amber Carson Myrtlenita Grupp. Lactation Consultation Note    Follow up consult with this mom and baby, now 3219 hours  old. Mom is choosing to pump and bottle feed, due to past and present difficulty with latching. Mom has only pumped once with DEP, and has been formula bottle feeding. I reviewed with mom how the breast makes milk, supply and demand, and how every 3 hours/ 8 times a day is reccommended . I also reviewed hand expression with mom - she reports not being able to tolerate the discomfort of hand expression. I explained how pumping will not always express colostrum, and HE is needed. I encouraged mom to call WIC and try to apply, so she can get a DEP. She did not want to rent, so I gave her a manual hand pmp and reviewed it's use with mom. Mom knows to call for questions/concerns   Patient Name: Amber Foster ZOXWR'UToday's Date: 07/29/2013 Reason for consult: Follow-up assessment   Maternal Data    Feeding    LATCH Score/Interventions                      Lactation Tools Discussed/Used Tools: Pump WIC Program: No ([facility]   Consult Status Consult Status: Complete    Alfred LevinsLee, Brannen Koppen Anne 07/29/2013, 10:59 AM

## 2013-07-31 NOTE — Progress Notes (Signed)
Ur chart review completed post discharge.  

## 2014-04-09 ENCOUNTER — Encounter (HOSPITAL_COMMUNITY): Payer: Self-pay

## 2014-05-08 ENCOUNTER — Ambulatory Visit: Payer: Self-pay | Admitting: Nurse Practitioner

## 2014-09-04 ENCOUNTER — Emergency Department: Payer: Self-pay | Admitting: Emergency Medicine

## 2014-09-04 LAB — URINALYSIS, COMPLETE
Bacteria: NONE SEEN
Bilirubin,UR: NEGATIVE
Glucose,UR: NEGATIVE mg/dL (ref 0–75)
Ketone: NEGATIVE
Nitrite: NEGATIVE
Ph: 5 (ref 4.5–8.0)
Protein: 30
RBC,UR: 246 /HPF (ref 0–5)
Specific Gravity: 1.025 (ref 1.003–1.030)
Squamous Epithelial: 1
WBC UR: 33 /HPF (ref 0–5)

## 2014-09-04 LAB — CBC
HCT: 40.7 % (ref 35.0–47.0)
HGB: 13.6 g/dL (ref 12.0–16.0)
MCH: 30.3 pg (ref 26.0–34.0)
MCHC: 33.4 g/dL (ref 32.0–36.0)
MCV: 91 fL (ref 80–100)
Platelet: 207 10*3/uL (ref 150–440)
RBC: 4.49 10*6/uL (ref 3.80–5.20)
RDW: 13.4 % (ref 11.5–14.5)
WBC: 14.7 10*3/uL — ABNORMAL HIGH (ref 3.6–11.0)

## 2014-09-04 LAB — COMPREHENSIVE METABOLIC PANEL
Albumin: 4.4 g/dL
Alkaline Phosphatase: 90 U/L
Anion Gap: 10 (ref 7–16)
BUN: 17 mg/dL
Bilirubin,Total: 0.8 mg/dL
Calcium, Total: 9.1 mg/dL
Chloride: 104 mmol/L
Co2: 22 mmol/L
Creatinine: 0.95 mg/dL
EGFR (African American): >60
EGFR (Non-African Amer.): >60
Glucose: 131 mg/dL — ABNORMAL HIGH
Potassium: 3.9 mmol/L
SGOT(AST): 32 U/L
SGPT (ALT): 34 U/L
Sodium: 136 mmol/L
Total Protein: 8.1 g/dL

## 2014-10-07 DIAGNOSIS — E282 Polycystic ovarian syndrome: Secondary | ICD-10-CM

## 2014-10-07 HISTORY — DX: Polycystic ovarian syndrome: E28.2

## 2014-10-24 LAB — HM PAP SMEAR: HM Pap smear: NEGATIVE

## 2014-11-08 DIAGNOSIS — R102 Pelvic and perineal pain: Secondary | ICD-10-CM | POA: Insufficient documentation

## 2015-05-07 ENCOUNTER — Encounter: Payer: Self-pay | Admitting: Family Medicine

## 2015-05-07 ENCOUNTER — Ambulatory Visit (INDEPENDENT_AMBULATORY_CARE_PROVIDER_SITE_OTHER): Payer: Commercial Managed Care - PPO | Admitting: Family Medicine

## 2015-05-07 VITALS — BP 120/72 | HR 74 | Resp 18 | Ht 65.0 in | Wt 173.0 lb

## 2015-05-07 DIAGNOSIS — N9489 Other specified conditions associated with female genital organs and menstrual cycle: Secondary | ICD-10-CM

## 2015-05-07 DIAGNOSIS — E282 Polycystic ovarian syndrome: Secondary | ICD-10-CM | POA: Insufficient documentation

## 2015-05-07 DIAGNOSIS — R102 Pelvic and perineal pain: Secondary | ICD-10-CM | POA: Diagnosis not present

## 2015-05-07 DIAGNOSIS — N858 Other specified noninflammatory disorders of uterus: Secondary | ICD-10-CM | POA: Insufficient documentation

## 2015-05-07 NOTE — Assessment & Plan Note (Signed)
Unclear etiology but noted in notes in Care everywhere--could this be related to prior childbirth or polyp/fibroid.  Will check hysteroscopy

## 2015-05-07 NOTE — Assessment & Plan Note (Signed)
[facility] on sonographic appearance of [facility]ies. Has previously had normal cycles and no issues with infertility.

## 2015-05-07 NOTE — Progress Notes (Signed)
    Subjective:    Patient ID: Amber Foster is a 34 y.o. female presenting with Menometrorrhagia  on 05/07/2015  HPI: Since birth of last child, Feb 2015, she has experienced irregular cycles and abnormal bleeding.  Cycles sometimes skip and can be heavy and sometimes light and may have issues with bleeding many days in a row.  Has noted increasing pain and nausea and notes pain is interfering with sex life. [facility] and noted to have many cysts on ovaries and something was inside her uterus. Was supposed to have some sort of outpatient procedure, but has not heard back from them. She has negative endometrial biopsy.  Prior to this her cycles were normal. She has not had recent weight gain. No infertilty.  Review of Systems  Constitutional: Negative for fever and chills.  Respiratory: Negative for shortness of breath.   Cardiovascular: Negative for chest pain.  Gastrointestinal: Negative for nausea, vomiting and abdominal pain.  Genitourinary: Negative for dysuria.  Skin: Negative for rash.      Objective:    BP 120/72 mmHg  Pulse 74  Resp 18  Ht 5\' 5"  (1.651 m)  Wt 173 lb (78.472 kg)  BMI 28.79 kg/m2  LMP 04/26/2015 Physical Exam  Constitutional: She is oriented to person, place, and time. She appears well-developed and well-nourished. No distress.  HENT:  Head: Normocephalic and atraumatic.  Eyes: No scleral icterus.  Neck: Neck supple.  Cardiovascular: Normal rate.   Pulmonary/Chest: Effort normal.  Abdominal: Soft.  Neurological: She is alert and oriented to person, place, and time.  Skin: Skin is warm and dry.  Psychiatric: She has a normal mood and affect.        Assessment & Plan:   Problem List Items Addressed This Visit      Unprioritized   PCOS (polycystic ovarian syndrome) - Primary    [facility] based on sonographic appearance of ovaries. Has previously had normal cycles and no issues with infertility.      Adnexal  pain    Given new onset and dyspareunia, will do laparoscopy to r/o endometriosis.      Uterine mass    Unclear etiology but noted in notes in Care everywhere--could this be related to prior childbirth or polyp/fibroid.  Will check hysteroscopy        Risks include but are not limited to bleeding, infection, injury to surrounding structures, including bowel, bladder and ureters, blood clots, and death.  Likelihood of success is high.  Total face-to-face time with patient: 30 minutes. Over 50% of encounter was spent on counseling and coordination of care. Return in about 3 months (around 08/06/2015).  [name] S 05/07/2015 2:58 PM

## 2015-05-07 NOTE — Patient Instructions (Addendum)
Polycystic Ovarian Syndrome Polycystic ovarian syndrome (PCOS) is a common hormonal disorder among women of reproductive age. Most women with PCOS grow many small cysts on their ovaries. PCOS can cause problems with your periods and make it difficult to get pregnant. It can also cause an increased risk of miscarriage with pregnancy. If left untreated, PCOS can lead to serious health problems, such as diabetes and heart disease. CAUSES The cause of PCOS is not fully understood, but genetics may be a factor. SIGNS AND SYMPTOMS   Infrequent or no menstrual periods.   Inability to get pregnant (infertility) because of not ovulating.   Increased growth of hair on the face, chest, stomach, back, thumbs, thighs, or toes.   Acne, oily skin, or dandruff.   Pelvic pain.   Weight gain or obesity, usually carrying extra weight around the waist.   Type 2 diabetes.   High cholesterol.   High blood pressure.   Female-pattern baldness or thinning hair.   Patches of thickened and dark brown or black skin on the neck, arms, breasts, or thighs.   Tiny excess flaps of skin (skin tags) in the armpits or neck area.   Excessive snoring and having breathing stop at times while asleep (sleep apnea).   Deepening of the voice.   Gestational diabetes when pregnant.  DIAGNOSIS  There is no single test to diagnose PCOS.   Your health care provider will:   Take a medical history.   Perform a pelvic exam.   Have ultrasonography done.   Check your female and female hormone levels.   Measure glucose or sugar levels in the blood.   Do other blood tests.   If you are producing too many female hormones, your health care provider will make sure it is from PCOS. At the physical exam, your health care provider will want to evaluate the areas of increased hair growth. Try to allow natural hair growth for a few days before the visit.   During a pelvic exam, the ovaries may be enlarged  or swollen because of the increased number of small cysts. This can be seen more easily by using vaginal ultrasonography or screening to examine the ovaries and lining of the uterus (endometrium) for cysts. The uterine lining may become thicker if you have not been having a regular period.  TREATMENT  Because there is no cure for PCOS, it needs to be managed to prevent problems. Treatments are based on your symptoms. Treatment is also based on whether you want to have a baby or whether you need contraception.  Treatment may include:   Progesterone hormone to start a menstrual period.   Birth control pills to make you have regular menstrual periods.   Medicines to make you ovulate, if you want to get pregnant.   Medicines to control your insulin.   Medicine to control your blood pressure.   Medicine and diet to control your high cholesterol and triglycerides in your blood.  Medicine to reduce excessive hair growth.  Surgery, making small holes in the ovary, to decrease the amount of female hormone production. This is done through a long, lighted tube (laparoscope) placed into the pelvis through a tiny incision in the lower abdomen.  HOME CARE INSTRUCTIONS  Only take over-the-counter or prescription medicine as directed by your health care provider.  Pay attention to the foods you eat and your activity levels. This can help reduce the effects of PCOS.  Keep your weight under control.  Eat foods that are   low in carbohydrate and high in fiber.  Exercise regularly. SEEK MEDICAL CARE IF:  Your symptoms do not get better with medicine.  You have new symptoms.   This information is not intended to replace advice given to you by your health care provider. Make sure you discuss any questions you have with your health care provider.   Document Released: 09/18/2004 Document Revised: 03/15/2013 Document Reviewed: 11/10/2012 Elsevier Interactive Patient Education [employer]. Hysteroscopy Hysteroscopy is a procedure used for looking inside the womb (uterus). It may be done for various reasons, including: To evaluate abnormal bleeding, fibroid (benign, noncancerous) tumors, polyps, scar tissue (adhesions), and possibly cancer of the uterus. To look for lumps (tumors) and other uterine growths. To look for causes of why a woman cannot get pregnant (infertility), causes of recurrent loss of pregnancy (miscarriages), or a lost intrauterine device (IUD). To perform a sterilization by blocking the fallopian tubes from inside the uterus. In this procedure, a thin, flexible tube with a tiny light and camera on the end of it (hysteroscope) is used to look inside the uterus. A hysteroscopy should be done right after a menstrual period to be sure you are not pregnant. LET [facility] CARE PROVIDER KNOW ABOUT:  Any allergies you have. All medicines you are taking, including vitamins, herbs, eye drops, creams, and over-the-counter medicines. Previous problems you or members of your family have had with the use of anesthetics. Any blood disorders you have. Previous surgeries you have had. Medical conditions you have. RISKS AND COMPLICATIONS  Generally, this is a safe procedure. However, as with any procedure, complications can occur. Possible complications include: Putting a hole in the uterus. Excessive bleeding. Infection. Damage to the cervix. Injury to other organs. Allergic reaction to medicines. Too much fluid used in the uterus for the procedure. BEFORE THE PROCEDURE  Ask your health care provider about changing or stopping any regular medicines. Do not take aspirin or blood thinners for 1 week before the procedure, or as directed by your health care provider. These can cause bleeding. If you smoke, do not smoke for 2 weeks before the procedure. In some cases, a medicine is placed in the cervix the day before the procedure. This medicine makes the cervix have a  larger opening (dilate). This makes it easier for the instrument to be inserted into the uterus during the procedure. Do not eat or drink anything for at least 8 hours before the surgery. Arrange for someone to take you home after the procedure. PROCEDURE  You may be given a medicine to relax you (sedative). You may also be given one of the following: A medicine that numbs the area around the cervix (local anesthetic). A medicine that makes you sleep through the procedure (general anesthetic). The hysteroscope is inserted through the vagina into the uterus. The camera on the hysteroscope sends a picture to a TV screen. This gives the surgeon a good view inside the uterus. During the procedure, air or a liquid is put into the uterus, which allows the surgeon to see better. Sometimes, tissue is gently scraped from inside the uterus. These tissue samples are sent to a lab for testing. AFTER THE PROCEDURE  If you had a general anesthetic, you may be groggy for a couple hours after the procedure. If you had a local anesthetic, you will be able to go home as soon as you are stable and feel ready. You may have some cramping. This normally lasts for a couple days.  You may have bleeding, which varies from light spotting for a few days to menstrual-like bleeding for 3-7 days. This is normal. If your test results are not back during the visit, make an appointment with your health care provider to find out the results.   This information is not intended to replace advice given to you by your health care provider. Make sure you discuss any questions you have with your health care provider.   Document Released: 08/31/2000 Document Revised: 03/15/2013 Document Reviewed: 12/22/2012 Elsevier Interactive Patient Education 2016 Elsevier Inc. Diagnostic Laparoscopy A diagnostic laparoscopy is a procedure to diagnose diseases in the abdomen. During the procedure, a thin, lighted, pencil-sized instrument called a  laparoscope is inserted into the abdomen through an incision. The laparoscope allows your health care provider to look at the organs inside your body. LET [facility] PROVID[facility] KNOW ABOUT: Any allergies you have. All medicines you are taking, including vitamins, herbs, eye drops, creams, and over-the-counter medicines. Previous problems you or members of your family have had with the use of anesthetics. Any blood disorders you have. Previous surgeries you have had. Medical conditions you have. RISKS AND COMPLICATIONS  Generally, this is a safe procedure. However, problems can occur, which may include: Infection. Bleeding. Damage to other organs. Allergic reaction to the anesthetics used during the procedure. BEFORE THE PROCEDURE Do not eat or drink anything after midnight on the night before the procedure or as directed by your health care provider. Ask your health care provider about: Changing or stopping your regular medicines. Taking medicines such as aspirin and ibuprofen. These medicines can thin your blood. Do not take these medicines before your procedure if your health care provider instructs you not to. Plan to have someone take you home after the procedure. PROCEDURE You may be given a medicine to help you relax (sedative). You will be given a medicine to make you sleep (general anesthetic). Your abdomen will be inflated with a gas. This will make your organs easier to see. Small incisions will be made in your abdomen. A laparoscope and other small instruments will be inserted into the abdomen through the incisions. A tissue sample may be removed from an organ in the abdomen for examination. The instruments will be removed from the abdomen. The gas will be released. The incisions will be closed with stitches (sutures). AFTER THE PROCEDURE  Your blood pressure, heart rate, breathing rate, and blood oxygen level will be monitored often until the medicines you were given  have worn off.   This information is not intended to replace advice given to you by your health care provider. Make sure you discuss any questions you have with your health care provider.   Document Released: 08/31/2000 Document Revised: 02/13/2015 Document Reviewed: 01/05/2014 Elsevier Interactive Patient Education [employer] Elsevier Inc.

## 2015-05-07 NOTE — Assessment & Plan Note (Signed)
Given new onset and dyspareunia, will do laparoscopy to r/o endometriosis.

## 2015-05-07 NOTE — Progress Notes (Signed)
Pt states since having her last child she has been experiencing irregular heavy vaginal bleeding with pelvic pain and pain with intercourse.  Stated [facility] and they performed a US and seen something in her uterus and also diagnosed her with PCOS.

## 2015-05-09 ENCOUNTER — Encounter (HOSPITAL_COMMUNITY): Payer: Self-pay | Admitting: *Deleted

## 2015-05-09 ENCOUNTER — Encounter: Payer: Self-pay | Admitting: *Deleted

## 2015-05-30 NOTE — H&P (Signed)
  Amber Foster is an 34 y.o. [phone] female.   Chief Complaint: pelvic pain and irregular bleeding HPI: Since birth of last child, Feb 2015, she has experienced irregular cycles and abnormal bleeding. Cycles sometimes skip and can be heavy and sometimes light and may have issues with bleeding many days in a row. Has noted increasing pain and nausea and notes pain is interfering with sex life. When [facility] and noted to have many cysts on ovaries and something was inside her uterus. Was supposed to have some sort of outpatient procedure, but has not heard back from them. She has negative endometrial biopsy. Prior to this her cycles were normal. She has not had recent weight gain. No infertilty.  Past Medical History  Diagnosis Date  . UTI (lower urinary tract infection)   . Yeast infection   . Congenital anomaly of spinal meninges (HCC)   . Headache(784.0)   . Gestational diabetes     during 2nd pregnancy after gallbladder removed  . Polycystic ovarian syndrome 10/2014    Past Surgical History  Procedure Laterality Date  . Cholecystectomy    . Wisdom tooth extraction    . Tonsillectomy      Family History  Problem Relation Age of Onset  . Hypertension Mother   . Cancer Paternal Grandfather     brain tumor  . Seizures Sister   . Bipolar disorder Sister    Social History:  reports that she has never smoked. She has never used smokeless tobacco. She reports that she does not drink alcohol or use illicit drugs.  Allergies:  Allergies  Allergen Reactions  . Other Other (See Comments)    Pt prefers no liquid medications, causes shaking    No current facility-administered medications on file prior to encounter.   Current Outpatient Prescriptions on File Prior to Encounter  Medication Sig Dispense Refill  . naproxen sodium (ANAPROX) 220 MG tablet Take 220 mg by mouth 2 (two) times daily with a meal.      A comprehensive review of systems was negative.  unknown if  currently breastfeeding. General appearance: alert, cooperative and appears stated age Head: Normocephalic, without obvious abnormality, atraumatic Neck: supple, symmetrical, trachea midline Lungs: normal effort Heart: regular rate and rhythm Abdomen: soft, non-tender; bowel sounds normal; no masses,  no organomegaly Extremities: extremities normal, atraumatic, no cyanosis or edema Pulses: 2+ and symmetric Skin: Skin color, texture, turgor normal. No rashes or lesions Neurologic: Grossly normal   Lab Results  Component Value Date   WBC 14.7* 09/04/2014   HGB 13.6 09/04/2014   HCT 40.7 09/04/2014   MCV 91 09/04/2014   PLT 207 09/04/2014    Assessment/Plan Principal Problem:   Adnexal pain Active Problems:   PCOS (polycystic ovarian syndrome)   Uterine mass   For diagnostic laparoscopy and hysteroscopy Risks include but are not limited to bleeding, infection, injury to surrounding structures, including bowel, bladder and ureters, blood clots, and death.  Likelihood of success is high.   Ardath Lepak S 05/30/2015, 5:43 PM

## 2015-06-05 ENCOUNTER — Encounter (HOSPITAL_COMMUNITY): Payer: Self-pay

## 2015-06-17 ENCOUNTER — Encounter (HOSPITAL_COMMUNITY): Payer: Self-pay | Admitting: *Deleted

## 2015-07-25 ENCOUNTER — Ambulatory Visit (HOSPITAL_COMMUNITY): Payer: Commercial Managed Care - PPO | Admitting: Anesthesiology

## 2015-07-25 ENCOUNTER — Encounter (HOSPITAL_COMMUNITY): Payer: Self-pay | Admitting: Anesthesiology

## 2015-07-25 ENCOUNTER — Ambulatory Visit (HOSPITAL_COMMUNITY)
Admission: RE | Admit: 2015-07-25 | Discharge: 2015-07-25 | Disposition: A | Discharge status: Home / Self Care | Payer: Commercial Managed Care - PPO | Source: Ambulatory Visit | Attending: Family Medicine | Admitting: Family Medicine

## 2015-07-25 ENCOUNTER — Encounter (HOSPITAL_COMMUNITY)
Admission: RE | Disposition: A | Discharge status: Home / Self Care | Payer: Self-pay | Source: Ambulatory Visit | Attending: Family Medicine

## 2015-07-25 DIAGNOSIS — R102 Pelvic and perineal pain: Secondary | ICD-10-CM | POA: Diagnosis not present

## 2015-07-25 DIAGNOSIS — E282 Polycystic ovarian syndrome: Secondary | ICD-10-CM | POA: Diagnosis present

## 2015-07-25 DIAGNOSIS — N858 Other specified noninflammatory disorders of uterus: Secondary | ICD-10-CM | POA: Diagnosis present

## 2015-07-25 DIAGNOSIS — N9489 Other specified conditions associated with female genital organs and menstrual cycle: Secondary | ICD-10-CM

## 2015-07-25 DIAGNOSIS — N939 Abnormal uterine and vaginal bleeding, unspecified: Secondary | ICD-10-CM | POA: Diagnosis not present

## 2015-07-25 HISTORY — PX: HYSTEROSCOPY: SHX211

## 2015-07-25 HISTORY — PX: LAPAROSCOPY: SHX197

## 2015-07-25 HISTORY — DX: Dorsalgia, unspecified: M54.9

## 2015-07-25 LAB — CBC
HCT: 38.1 % (ref 36.0–46.0)
Hemoglobin: 12.8 g/dL (ref 12.0–15.0)
MCH: 30.6 pg (ref 26.0–34.0)
MCHC: 33.6 g/dL (ref 30.0–36.0)
MCV: 91.1 fL (ref 78.0–100.0)
Platelets: 241 10*3/uL (ref 150–400)
RBC: 4.18 MIL/uL (ref 3.87–5.11)
RDW: 13.5 % (ref 11.5–15.5)
WBC: 7.2 10*3/uL (ref 4.0–10.5)

## 2015-07-25 LAB — PREGNANCY, URINE: Preg Test, Ur: NEGATIVE

## 2015-07-25 SURGERY — LAPAROSCOPY, DIAGNOSTIC
Anesthesia: General | Site: Vagina

## 2015-07-25 MED ORDER — LACTATED RINGERS IV SOLN
INTRAVENOUS | Status: DC
  Administered 2015-07-25 (×2): via INTRAVENOUS

## 2015-07-25 MED ORDER — LACTATED RINGERS IV SOLN: INTRAVENOUS | Status: DC

## 2015-07-25 MED ORDER — SODIUM CHLORIDE 0.9 % IR SOLN
Status: DC | PRN
  Administered 2015-07-25: 3000 mL

## 2015-07-25 MED ORDER — PHENYLEPHRINE HCL 10 MG/ML IJ SOLN
INTRAMUSCULAR | Status: DC | PRN
  Administered 2015-07-25 (×2): 40 ug via INTRAVENOUS
  Administered 2015-07-25: 120 ug via INTRAVENOUS

## 2015-07-25 MED ORDER — ONDANSETRON HCL 4 MG/2ML IJ SOLN
INTRAMUSCULAR | Status: DC | PRN
  Administered 2015-07-25: 4 mg via INTRAVENOUS

## 2015-07-25 MED ORDER — PROPOFOL 10 MG/ML IV BOLUS
INTRAVENOUS | Status: DC | PRN
  Administered 2015-07-25: 180 mg via INTRAVENOUS

## 2015-07-25 MED ORDER — ONDANSETRON HCL 4 MG/2ML IJ SOLN
INTRAMUSCULAR | Status: AC
  Filled 2015-07-25: qty 2

## 2015-07-25 MED ORDER — BUPIVACAINE-EPINEPHRINE (PF) 0.25% -1:200000 IJ SOLN
INTRAMUSCULAR | Status: AC
  Filled 2015-07-25: qty 30

## 2015-07-25 MED ORDER — LIDOCAINE HCL (CARDIAC) 20 MG/ML IV SOLN
INTRAVENOUS | Status: DC | PRN
  Administered 2015-07-25: 70 mg via INTRAVENOUS
  Administered 2015-07-25: 30 mg via INTRAVENOUS

## 2015-07-25 MED ORDER — ROCURONIUM BROMIDE 100 MG/10ML IV SOLN
INTRAVENOUS | Status: AC
  Filled 2015-07-25: qty 1

## 2015-07-25 MED ORDER — FENTANYL CITRATE (PF) 100 MCG/2ML IJ SOLN
25.0000 ug | INTRAMUSCULAR | Status: DC | PRN
  Administered 2015-07-25 (×2): 50 ug via INTRAVENOUS

## 2015-07-25 MED ORDER — FENTANYL CITRATE (PF) 100 MCG/2ML IJ SOLN
INTRAMUSCULAR | Status: AC
  Administered 2015-07-25: 50 ug via INTRAVENOUS
  Filled 2015-07-25: qty 2

## 2015-07-25 MED ORDER — ONDANSETRON HCL 4 MG/2ML IJ SOLN: 4.0000 mg | Freq: Once | INTRAMUSCULAR | Status: DC | PRN

## 2015-07-25 MED ORDER — GLYCOPYRROLATE 0.2 MG/ML IJ SOLN
INTRAMUSCULAR | Status: DC | PRN
  Administered 2015-07-25: 0.6 mg via INTRAVENOUS

## 2015-07-25 MED ORDER — SCOPOLAMINE 1 MG/3DAYS TD PT72
1.0000 | MEDICATED_PATCH | Freq: Once | TRANSDERMAL | Status: DC
  Administered 2015-07-25: 1.5 mg via TRANSDERMAL

## 2015-07-25 MED ORDER — GLYCOPYRROLATE 0.2 MG/ML IJ SOLN
INTRAMUSCULAR | Status: AC
  Filled 2015-07-25: qty 3

## 2015-07-25 MED ORDER — NEOSTIGMINE METHYLSULFATE 10 MG/10ML IV SOLN
INTRAVENOUS | Status: DC | PRN
Start: 2015-07-25 — End: 2015-07-25
  Administered 2015-07-25: 4 mg via INTRAVENOUS

## 2015-07-25 MED ORDER — MIDAZOLAM HCL 2 MG/2ML IJ SOLN
INTRAMUSCULAR | Status: DC | PRN
  Administered 2015-07-25: 1 mg via INTRAVENOUS

## 2015-07-25 MED ORDER — PROPOFOL 10 MG/ML IV BOLUS
INTRAVENOUS | Status: AC
  Filled 2015-07-25: qty 20

## 2015-07-25 MED ORDER — SCOPOLAMINE 1 MG/3DAYS TD PT72
MEDICATED_PATCH | TRANSDERMAL | Status: AC
  Administered 2015-07-25: 1.5 mg via TRANSDERMAL
  Filled 2015-07-25: qty 1

## 2015-07-25 MED ORDER — KETOROLAC TROMETHAMINE 30 MG/ML IJ SOLN
INTRAMUSCULAR | Status: DC | PRN
  Administered 2015-07-25: 30 mg via INTRAVENOUS

## 2015-07-25 MED ORDER — KETOROLAC TROMETHAMINE 30 MG/ML IJ SOLN
INTRAMUSCULAR | Status: AC
  Filled 2015-07-25: qty 1

## 2015-07-25 MED ORDER — FENTANYL CITRATE (PF) 100 MCG/2ML IJ SOLN
INTRAMUSCULAR | Status: DC | PRN
  Administered 2015-07-25 (×3): 50 ug via INTRAVENOUS

## 2015-07-25 MED ORDER — LIDOCAINE HCL (CARDIAC) 20 MG/ML IV SOLN
INTRAVENOUS | Status: AC
  Filled 2015-07-25: qty 5

## 2015-07-25 MED ORDER — ROCURONIUM BROMIDE 100 MG/10ML IV SOLN
INTRAVENOUS | Status: DC | PRN
  Administered 2015-07-25: 45 mg via INTRAVENOUS

## 2015-07-25 MED ORDER — FENTANYL CITRATE (PF) 250 MCG/5ML IJ SOLN
INTRAMUSCULAR | Status: AC
  Filled 2015-07-25: qty 5

## 2015-07-25 MED ORDER — BUPIVACAINE HCL (PF) 0.25 % IJ SOLN
INTRAMUSCULAR | Status: DC | PRN
  Administered 2015-07-25: 5 mL

## 2015-07-25 MED ORDER — EPHEDRINE 5 MG/ML INJ
INTRAVENOUS | Status: AC
  Filled 2015-07-25: qty 10

## 2015-07-25 MED ORDER — EPHEDRINE SULFATE 50 MG/ML IJ SOLN
INTRAMUSCULAR | Status: DC | PRN
  Administered 2015-07-25: 15 mg via INTRAVENOUS

## 2015-07-25 MED ORDER — DEXAMETHASONE SODIUM PHOSPHATE 4 MG/ML IJ SOLN
INTRAMUSCULAR | Status: AC
  Filled 2015-07-25: qty 1

## 2015-07-25 MED ORDER — OXYCODONE-ACETAMINOPHEN 5-325 MG PO TABS: 1.0000 | ORAL_TABLET | Freq: Four times a day (QID) | ORAL | Status: DC | PRN

## 2015-07-25 MED ORDER — BUPIVACAINE HCL (PF) 0.25 % IJ SOLN
INTRAMUSCULAR | Status: AC
  Filled 2015-07-25: qty 30

## 2015-07-25 MED ORDER — DEXAMETHASONE SODIUM PHOSPHATE 10 MG/ML IJ SOLN
INTRAMUSCULAR | Status: DC | PRN
  Administered 2015-07-25: 4 mg via INTRAVENOUS

## 2015-07-25 MED ORDER — PHENYLEPHRINE 40 MCG/ML (10ML) SYRINGE FOR IV PUSH (FOR BLOOD PRESSURE SUPPORT)
PREFILLED_SYRINGE | INTRAVENOUS | Status: AC
  Filled 2015-07-25: qty 10

## 2015-07-25 MED ORDER — MIDAZOLAM HCL 2 MG/2ML IJ SOLN
INTRAMUSCULAR | Status: AC
  Filled 2015-07-25: qty 2

## 2015-07-25 SURGICAL SUPPLY — 37 items
CABLE HIGH FREQUENCY MONO STRZ (ELECTRODE) IMPLANT
CANISTER SUCT 3000ML (MISCELLANEOUS) ×3 IMPLANT
CATH ROBINSON RED A/P 16FR (CATHETERS) IMPLANT
CHLORAPREP W/TINT 26ML (MISCELLANEOUS) ×3 IMPLANT
CLOTH BEACON ORANGE TIMEOUT ST (SAFETY) ×3 IMPLANT
CONTAINER PREFILL 10% NBF 60ML (FORM) ×6 IMPLANT
DRSG COVADERM PLUS 2X2 (GAUZE/BANDAGES/DRESSINGS) IMPLANT
DRSG OPSITE POSTOP 3X4 (GAUZE/BANDAGES/DRESSINGS) ×3 IMPLANT
DRSG TELFA 3X8 NADH (GAUZE/BANDAGES/DRESSINGS) ×3 IMPLANT
ELECT REM PT RETURN 9FT ADLT (ELECTROSURGICAL) ×3
ELECTRODE REM PT RTRN 9FT ADLT (ELECTROSURGICAL) ×2 IMPLANT
GLOVE BIOGEL PI IND STRL 7.0 (GLOVE) ×4 IMPLANT
GLOVE BIOGEL PI INDICATOR 7.0 (GLOVE) ×2
GLOVE ECLIPSE 7.0 STRL STRAW (GLOVE) ×6 IMPLANT
GOWN STRL REUS W/TWL LRG LVL3 (GOWN DISPOSABLE) ×9 IMPLANT
LIQUID BAND (GAUZE/BANDAGES/DRESSINGS) ×3 IMPLANT
LOOP ANGLED CUTTING 22FR (CUTTING LOOP) IMPLANT
NS IRRIG 1000ML POUR BTL (IV SOLUTION) IMPLANT
PACK LAPAROSCOPY BASIN (CUSTOM PROCEDURE TRAY) ×3 IMPLANT
PACK VAGINAL MINOR WOMEN LF (CUSTOM PROCEDURE TRAY) ×3 IMPLANT
PAD OB MATERNITY 4.3X12.25 (PERSONAL CARE ITEMS) ×3 IMPLANT
PAD TRENDELENBURG POSITION (MISCELLANEOUS) ×3 IMPLANT
SET IRRIG TUBING LAPAROSCOPIC (IRRIGATION / IRRIGATOR) IMPLANT
SLEEVE XCEL OPT CAN 5 100 (ENDOMECHANICALS) IMPLANT
SUT VIC AB 3-0 X1 27 (SUTURE) ×3 IMPLANT
SUT VICRYL 0 UR6 27IN ABS (SUTURE) ×6 IMPLANT
SUT VICRYL 4-0 PS2 18IN ABS (SUTURE) ×3 IMPLANT
TOWEL OR 17X24 6PK STRL BLUE (TOWEL DISPOSABLE) ×6 IMPLANT
TRAY FOLEY CATH SILVER 14FR (SET/KITS/TRAYS/PACK) ×3 IMPLANT
TROCAR BALLN 12MMX100 BLUNT (TROCAR) ×3 IMPLANT
TROCAR OPTI TIP 5M 100M (ENDOMECHANICALS) ×6 IMPLANT
TROCAR XCEL DIL TIP R 11M (ENDOMECHANICALS) IMPLANT
TROCAR XCEL NON-BLD 5MMX100MML (ENDOMECHANICALS) ×3 IMPLANT
TUBING AQUILEX INFLOW (TUBING) ×3 IMPLANT
TUBING AQUILEX OUTFLOW (TUBING) ×3 IMPLANT
WARMER LAPAROSCOPE (MISCELLANEOUS) ×3 IMPLANT
WATER STERILE IRR 1000ML POUR (IV SOLUTION) ×3 IMPLANT

## 2015-07-25 NOTE — Transfer of Care (Signed)
Immediate Anesthesia Transfer of Care Note  Patient: Amber Foster  Procedure(s) Performed: Procedure(s): LAPAROSCOPY DIAGNOSTIC, POSTERIOR CUL DE SAC BIOPSY (N/A) HYSTEROSCOPY (N/A)  Patient Location: PACU  Anesthesia Type:General  Level of Consciousness: awake, alert , oriented and patient cooperative  Airway & Oxygen Therapy: Patient Spontanous Breathing and Patient connected to nasal cannula oxygen  Post-op Assessment: Report given to RN and Post -op Vital signs reviewed and stable  Post vital signs: Reviewed and stable  Last Vitals:  Filed Vitals:   07/25/15 1247  BP: 128/68  Pulse: 60  Temp: 36.4 C  Resp: 20    Complications: No apparent anesthesia complications

## 2015-07-25 NOTE — Discharge Instructions (Signed)
Hysteroscopy Hysteroscopy is a procedure used for looking inside the womb (uterus). It may be done for various reasons, including:  To evaluate abnormal bleeding, fibroid (benign, noncancerous) tumors, polyps, scar tissue (adhesions), and possibly cancer of the uterus.  To look for lumps (tumors) and other uterine growths.  To look for causes of why a woman cannot get pregnant (infertility), causes of recurrent loss of pregnancy (miscarriages), or a lost intrauterine device (IUD).  To perform a sterilization by blocking the fallopian tubes from inside the uterus. In this procedure, a thin, flexible tube with a tiny light and camera on the end of it (hysteroscope) is used to look inside the uterus. A hysteroscopy should be done right after a menstrual period to be sure you are not pregnant. LET [facility] CARE PROVIDER KNOW ABOUT:   Any allergies you have.  All medicines you are taking, including vitamins, herbs, eye drops, creams, and over-the-counter medicines.  Previous problems you or members of your family have had with the use of anesthetics.  Any blood disorders you have.  Previous surgeries you have had.  Medical conditions you have. RISKS AND COMPLICATIONS  Generally, this is a safe procedure. However, as with any procedure, complications can occur. Possible complications include:  Putting a hole in the uterus.  Excessive bleeding.  Infection.  Damage to the cervix.  Injury to other organs.  Allergic reaction to medicines.  Too much fluid used in the uterus for the procedure. BEFORE THE PROCEDURE   Ask your health care provider about changing or stopping any regular medicines.  Do not take aspirin or blood thinners for 1 week before the procedure, or as directed by your health care provider. These can cause bleeding.  If you smoke, do not smoke for 2 weeks before the procedure.  In some cases, a medicine is placed in the cervix the day before the procedure.  This medicine makes the cervix have a larger opening (dilate). This makes it easier for the instrument to be inserted into the uterus during the procedure.  Do not eat or drink anything for at least 8 hours before the surgery.  Arrange for someone to take you home after the procedure. PROCEDURE   You may be given a medicine to relax you (sedative). You may also be given one of the following:  A medicine that numbs the area around the cervix (local anesthetic).  A medicine that makes you sleep through the procedure (general anesthetic).  The hysteroscope is inserted through the vagina into the uterus. The camera on the hysteroscope sends a picture to a TV screen. This gives the surgeon a good view inside the uterus.  During the procedure, air or a liquid is put into the uterus, which allows the surgeon to see better.  Sometimes, tissue is gently scraped from inside the uterus. These tissue samples are sent to a lab for testing. AFTER THE PROCEDURE   If you had a general anesthetic, you may be groggy for a couple hours after the procedure.  If you had a local anesthetic, you will be able to go home as soon as you are stable and feel ready.  You may have some cramping. This normally lasts for a couple days.  You may have bleeding, which varies from light spotting for a few days to menstrual-like bleeding for 3-7 days. This is normal.  If your test results are not back during the visit, make an appointment with your health care provider to find out the  results.   This information is not intended to replace advice given to you by your health care provider. Make sure you discuss any questions you have with your health care provider.   Document Released: 08/31/2000 Document Revised: 03/15/2013 Document Reviewed: 12/22/2012 Elsevier Interactive Patient Education 2016 Elsevier Inc. Diagnostic Laparoscopy A diagnostic laparoscopy is a procedure to diagnose diseases in the abdomen. During  the procedure, a thin, lighted, pencil-sized instrument called a laparoscope is inserted into the abdomen through an incision. The laparoscope allows your health care provider to look at the organs inside your body. LET [facility] CARE PROVIDER KNOW ABOUT:  Any allergies you have.  All medicines you are taking, including vitamins, herbs, eye drops, creams, and over-the-counter medicines.  Previous problems you or members of your family have had with the use of anesthetics.  Any blood disorders you have.  Previous surgeries you have had.  Medical conditions you have. RISKS AND COMPLICATIONS  Generally, this is a safe procedure. However, problems can occur, which may include:  Infection.  Bleeding.  Damage to other organs.  Allergic reaction to the anesthetics used during the procedure. BEFORE THE PROCEDURE  Do not eat or drink anything after midnight on the night before the procedure or as directed by your health care provider.  Ask your health care provider about:  Changing or stopping your regular medicines.  Taking medicines such as aspirin and ibuprofen. These medicines can thin your blood. Do not take these medicines before your procedure if your health care provider instructs you not to.  Plan to have someone take you home after the procedure. PROCEDURE  You may be given a medicine to help you relax (sedative).  You will be given a medicine to make you sleep (general anesthetic).  Your abdomen will be inflated with a gas. This will make your organs easier to see.  Small incisions will be made in your abdomen.  A laparoscope and other small instruments will be inserted into the abdomen through the incisions.  A tissue sample may be removed from an organ in the abdomen for examination.  The instruments will be removed from the abdomen.  The gas will be released.  The incisions will be closed with stitches (sutures). AFTER THE PROCEDURE  Your blood pressure,  heart rate, breathing rate, and blood oxygen level will be monitored often until the medicines you were given have worn off.   This information is not intended to replace advice given to you by your health care provider. Make sure you discuss any questions you have with your health care provider.   Document Released: 08/31/2000 Document Revised: 02/13/2015 Document Reviewed: 01/05/2014 Elsevier Interactive Patient Education 2016 Elsevier Inc. DISCHARGE INSTRUCTIONS: Laparoscopy  The following instructions have been prepared to help you care for yourself upon your return home today.  Wound care:  Do not get the incision wet for the first 24 hours. The incision should be kept clean and dry.  The Band-Aids or dressings may be removed the day after surgery.  Should the incision become sore, red, and swollen after the first week, check with your doctor.  Personal hygiene:  Shower the day after your procedure.  Activity and limitations:  Do NOT drive or operate any equipment today.  Do NOT lift anything more than 15 pounds for 2-3 weeks after surgery.  Do NOT rest in bed all day.  Walking is encouraged. Walk each day, starting slowly with 5-minute walks 3 or 4 times a day. Slowly increase  the length of your walks.  Walk up and down stairs slowly.  Do NOT do strenuous activities, such as golfing, playing tennis, bowling, running, biking, weight lifting, gardening, mowing, or vacuuming for 2-4 weeks. Ask your doctor when it is okay to start.  Diet: Eat a light meal as desired this evening. You may resume your usual diet tomorrow.  Return to work: This is dependent on the type of work you do. For the most part you can return to a desk job within a week of surgery. If you are more active at work, please discuss this with your doctor.  What to expect after your surgery: You may have a slight burning sensation when you urinate on the first day. You may have a very small amount of blood  in the urine. Expect to have a small amount of vaginal discharge/light bleeding for 1-2 weeks. It is not unusual to have abdominal soreness and bruising for up to 2 weeks. You may be tired and need more rest for about 1 week. You may experience shoulder pain for 24-72 hours. Lying flat in bed may relieve it.  Call your doctor for any of the following:  Develop a fever of 100.4 or greater  Inability to urinate 6 hours after discharge from hospital  Severe pain not relieved by pain medications  Persistent of heavy bleeding at incision site  Redness or swelling around incision site after a week  Increasing nausea or vomiting  Patient Signature________________________________________ Nurse Signature_________________________________________

## 2015-07-25 NOTE — Anesthesia Preprocedure Evaluation (Signed)
Anesthesia Evaluation  Patient identified by MRN, date of birth, ID band Patient awake    Reviewed: Allergy & Precautions, NPO status , Patient's Chart, lab work & pertinent test results  History of Anesthesia Complications Negative for: history of anesthetic complications  Airway Mallampati: II  TM Distance: >3 FB Neck ROM: Full    Dental no notable dental hx. (+) Dental Advisory Given   Pulmonary neg pulmonary ROS,    Pulmonary exam normal breath sounds clear to auscultation       Cardiovascular negative cardio ROS Normal cardiovascular exam Rhythm:Regular Rate:Normal     Neuro/Psych  Headaches, negative psych ROS   GI/Hepatic negative GI ROS, Neg liver ROS,   Endo/Other  diabetes, Gestationalobesity  Renal/GU negative Renal ROS  negative genitourinary   Musculoskeletal negative musculoskeletal ROS (+)   Abdominal   Peds negative pediatric ROS (+)  Hematology negative hematology ROS (+)   Anesthesia Other Findings   Reproductive/Obstetrics negative OB ROS                             Anesthesia Physical Anesthesia Plan  ASA: II  Anesthesia Plan: General   Post-op Pain Management:    Induction: Intravenous  Airway Management Planned: Oral ETT  Additional Equipment:   Intra-op Plan:   Post-operative Plan: Extubation in OR  Informed Consent: I have reviewed the patients History and Physical, chart, labs and discussed the procedure including the risks, benefits and alternatives for the proposed anesthesia with the patient or authorized representative who has indicated his/her understanding and acceptance.   Dental advisory given  Plan Discussed with: CRNA  Anesthesia Plan Comments:         Anesthesia Quick Evaluation

## 2015-07-25 NOTE — Interval H&P Note (Signed)
History and Physical Interval Note:  07/25/2015 1:03 PM  Amber Foster  has presented today for surgery, with the diagnosis of  Dyspareunia and abnormal uterine bleeding  The various methods of treatment have been discussed with the patient and family. After consideration of risks, benefits and other options for treatment, the patient has consented to  Procedure(s): LAPAROSCOPY DIAGNOSTIC (N/A) HYSTEROSCOPY (N/A) as a surgical intervention .  The patient's history has been reviewed, patient examined, no change in status, stable for surgery.  I have reviewed the patient's chart and labs.  Questions were answered to the patient's satisfaction.     PRATT,TANYA S

## 2015-07-25 NOTE — Op Note (Signed)
Preoperative diagnosis: Pelvic/adnexal pain, possible uterine mass, PCOS  Postoperative diagnosis: Same, no evidence of uterine mass  Procedure: Diagnostic laparoscopy, D & C Hysteroscopy  Surgeon: Tinnie Gens, MD  Anesthesia: Charlesetta Garibaldi, MD  Findings: Normal appearing uterus, tubes are thickened but normal fimbria, bilateral enlarged ovaries.  No evidence of endometriosis.  Normal anterior cul-de-sac. There was an area of redness noted in the posterior cul-de-sac.  Normal liver edge, normal appendix, uterine cavity is normal with no masses and both ostia seen, no significant adhesive disease.  Estimated blood loss: Minimal  Complications: None known  Specimens: Peritoneal biopsies, endometrial curettings   Disposition of Specimen:  Pathology  Reason for procedure: 35 y.o. [id number] with h/o painful intercourse and abnormal cycles.  She has previous work-up at [facility] and was scheduled for Hysteroscopy and Laparoscopy for diagnosis. Supposedly, a pelvic sono showed PCOS ovaries and possible mass in the uterus. She mostly desires diagnosis.  Procedure: Patient was taken to the operating room was placed in dorsal lithotomy in [place] stirrups. She was prepped and draped in the usual sterile fashion. A timeout was performed. The SCDs were in place. Foley catheter is used to drain bladder. Speculum was placed inside the vagina. The cervix was visualized and grasped anteriorly with a single-tooth tenaculum. A Hulka tenaculum was placed through the cervix for uterine manipulation. The single tooth tenaculum and speculum were removed from the vagina.  Attention was then turned to the abdomen. Five cc of 0.25% Marcaine was injected at the umbilicus. Two Allis clamps were used to tent up the skin of the umbilicus a vertical one half centimeter incision was made here. The fascia was incised with the knife  And the peritoneum was entered sharply with this incision. The fascia had a purse-string of 0  Vicryl suture on a UR 6 needle placed. A Hassan trocar was placed through this incision and a pneumoperitoneum was created. The patient was then placed in Trendelenburg. The pelvis was inspected in a systematic fashion. The patient's right and left tubes and ovaries appeared normal. The posterior and anterior cul-de-sacs were inspected and felt to be normal except for a small area of redness. The ovarian fossa were inspected and also felt to be normal. The appendix was inspected and felt to be normal. The upper abdomen was inspected the liver edge gallbladder and stomach appeared normal. A peritoneal biopsy was obtained at the area of redness. All findings were photographed. There were no additional findings in the pelvis and so the procedure was terminated. The umbilicus was closed with the fascial closure above and subcuticular stitch.  Attention was then turned to the vagina.   A speculum was placeed in the vagina. The Hulka tenaculum was removed. The cervix was visualized anteriorly and grasped with a single-tooth tenaculum. The uterus sounded to 8 cm. Sequential dilation was performed with Shawnie Pons dilators. The hysteroscope was inserted and the endometrial cavity and inspected. There were no significant findings noted in the endometrial cavity with both ostia seen.  The hysteroscope was removed. Sharp curettage was performed in all 4 quadrants. All instruments were removed from the vagina. All instrument, needle and lap counts were correct x2. The patient was awakened and is recovering in stable condition.  Amber Foster SMD 07/25/2015 2:09 PM

## 2015-07-25 NOTE — Anesthesia Postprocedure Evaluation (Signed)
Anesthesia Post Note  Patient: Amber Foster  Procedure(s) Performed: Procedure(s) (LRB): LAPAROSCOPY DIAGNOSTIC, POSTERIOR CUL DE SAC BIOPSY (N/A) HYSTEROSCOPY (N/A)  Patient location during evaluation: PACU Anesthesia Type: General Level of consciousness: awake and alert Pain management: pain level controlled Vital Signs Assessment: post-procedure vital signs reviewed and stable Respiratory status: spontaneous breathing, nonlabored ventilation, respiratory function stable and patient connected to nasal cannula oxygen Cardiovascular status: blood pressure returned to baseline and stable Postop Assessment: no signs of nausea or vomiting Anesthetic complications: no    Last Vitals:  Filed Vitals:   07/25/15 1500 07/25/15 1515  BP: 92/52   Pulse: 58 56  Temp:    Resp: 16 16    Last Pain:  Filed Vitals:   07/25/15 1516  PainSc: 2                  Sarinity Dicicco JENNETTE

## 2015-07-25 NOTE — Anesthesia Procedure Notes (Signed)
Date/Time: 07/25/2015 1:25 PM Performed by: Suella Grove Pre-anesthesia Checklist: Patient identified, Timeout performed, Emergency Drugs available, Suction available and Patient being monitored Patient Re-evaluated:Patient Re-evaluated prior to inductionOxygen Delivery Method: Circle system utilized and Simple face mask Preoxygenation: Pre-oxygenation with 100% oxygen Intubation Type: IV induction and Inhalational induction Ventilation: Mask ventilation without difficulty Laryngoscope Size: Mac and 3 Grade View: Grade II Tube type: Oral Tube size: 7.0 mm Number of attempts: 1 Placement Confirmation: ETT inserted through vocal cords under direct vision,  positive ETCO2 and breath sounds checked- equal and bilateral Secured at: 21 cm Tube secured with: Tape Dental Injury: Teeth and Oropharynx as per pre-operative assessment

## 2015-07-26 ENCOUNTER — Encounter (HOSPITAL_COMMUNITY): Payer: Self-pay | Admitting: Family Medicine

## 2015-07-29 ENCOUNTER — Telehealth: Payer: Self-pay

## 2015-07-29 NOTE — Telephone Encounter (Signed)
Received a message from Dr. Shawnie Pons to call patient to schedule a 2wk Postop appt, called patient, no answer, left a voice mail instructing her to return my call here at the office.

## 2015-08-01 ENCOUNTER — Telehealth: Payer: Self-pay | Admitting: *Deleted

## 2015-08-01 NOTE — Telephone Encounter (Signed)
Pt requesting pathology results, read results to her and gave reassurance, follow-up is 08-14-15.

## 2015-08-05 ENCOUNTER — Telehealth: Payer: Self-pay | Admitting: *Deleted

## 2015-08-05 NOTE — Telephone Encounter (Signed)
Pt tried to see pathology results from surgical pathology, unable to view results.  Attempted to email pt her results, unsuccessful, will mail her a copy.

## 2015-08-14 ENCOUNTER — Encounter: Payer: Self-pay | Admitting: Family Medicine

## 2015-08-14 ENCOUNTER — Ambulatory Visit (INDEPENDENT_AMBULATORY_CARE_PROVIDER_SITE_OTHER): Payer: Commercial Managed Care - PPO | Admitting: Family Medicine

## 2015-08-14 VITALS — BP 107/71 | HR 74 | Ht 65.0 in | Wt 171.0 lb

## 2015-08-14 DIAGNOSIS — E282 Polycystic ovarian syndrome: Secondary | ICD-10-CM

## 2015-08-14 DIAGNOSIS — R102 Pelvic and perineal pain: Secondary | ICD-10-CM

## 2015-08-14 DIAGNOSIS — N941 Unspecified dyspareunia: Secondary | ICD-10-CM | POA: Diagnosis not present

## 2015-08-14 DIAGNOSIS — N939 Abnormal uterine and vaginal bleeding, unspecified: Secondary | ICD-10-CM

## 2015-08-14 MED ORDER — NORGESTIMATE-ETH ESTRADIOL 0.25-35 MG-MCG PO TABS: 1.0000 | ORAL_TABLET | Freq: Every day | ORAL | Status: DC

## 2015-08-14 NOTE — Progress Notes (Signed)
    Subjective:    Patient ID: Amber Foster is a 35 y.o. female presenting with Routine Post Op  on 08/14/2015  HPI: Here post op. Still with abnormal bleeding and findings at surgery were benign. Still having pelvic pain intermittently. Still with painful intercourse. Denies dysuria or bleeding with urination.  Review of Systems  Constitutional: Negative for fever and chills.  Respiratory: Negative for shortness of breath.   Cardiovascular: Negative for chest pain.  Gastrointestinal: Negative for nausea, vomiting and abdominal pain.  Genitourinary: Positive for pelvic pain (occasional cramping). Negative for dysuria.  Skin: Negative for rash.      Objective:    BP 107/71 mmHg  Pulse 74  Ht 5\' 5"  (1.651 m)  Wt 171 lb (77.565 kg)  BMI 28.46 kg/m2  LMP 07/23/2015 Physical Exam  Constitutional: She is oriented to person, place, and time. She appears well-developed and well-nourished. No distress.  HENT:  Head: Normocephalic and atraumatic.  Eyes: No scleral icterus.  Neck: Neck supple.  Cardiovascular: Normal rate.   Pulmonary/Chest: Effort normal.  Abdominal: Soft.  Neurological: She is alert and oriented to person, place, and time.  Skin: Skin is warm and dry.  dermabond remains in place, but incision is healing well.  Psychiatric: She has a normal mood and affect.        Assessment & Plan:   Problem List Items Addressed This Visit      Unprioritized   PCOS (polycystic ovarian syndrome)   Relevant Medications   norgestimate-ethinyl estradiol (ORTHO-CYCLEN,SPRINTEC,PREVIFEM) 0.25-35 MG-MCG tablet   Adnexal pain - Primary    Unclear etiology--trial of OC's.  If no improvement, consider urology referral to see about IC.      Abnormal uterine bleeding    Normal hysteroscopy - trial of OC's.      Dyspareunia in female      Total face-to-face time with patient: 15 minutes. Over 50% of encounter was spent on counseling and coordination of care. Return in about 8  weeks (around 10/09/2015) for a follow-up.  Carel Carrier S 08/14/2015 11:03 AM

## 2015-08-14 NOTE — Assessment & Plan Note (Signed)
Normal hysteroscopy - trial of OC's.

## 2015-08-14 NOTE — Patient Instructions (Signed)

## 2015-08-14 NOTE — Assessment & Plan Note (Signed)
Unclear etiology--trial of OC's.  If no improvement, consider urology referral to see about IC.

## 2015-10-08 ENCOUNTER — Encounter: Payer: Self-pay | Admitting: Obstetrics & Gynecology

## 2015-10-08 ENCOUNTER — Ambulatory Visit (INDEPENDENT_AMBULATORY_CARE_PROVIDER_SITE_OTHER): Payer: Commercial Managed Care - PPO | Admitting: Obstetrics & Gynecology

## 2015-10-08 VITALS — BP 108/73 | HR 67 | Resp 18 | Ht 65.0 in | Wt 174.0 lb

## 2015-10-08 DIAGNOSIS — Z0000 Encounter for general adult medical examination without abnormal findings: Secondary | ICD-10-CM

## 2015-10-08 DIAGNOSIS — R7309 Other abnormal glucose: Secondary | ICD-10-CM

## 2015-10-08 DIAGNOSIS — R739 Hyperglycemia, unspecified: Secondary | ICD-10-CM

## 2015-10-08 DIAGNOSIS — N938 Other specified abnormal uterine and vaginal bleeding: Secondary | ICD-10-CM

## 2015-10-08 LAB — CBC
HCT: 37 % (ref 35.0–45.0)
Hemoglobin: 12.4 g/dL (ref 11.7–15.5)
MCH: 30.2 pg (ref 27.0–33.0)
MCHC: 33.5 g/dL (ref 32.0–36.0)
MCV: 90 fL (ref 80.0–100.0)
MPV: 11.2 fL (ref 7.5–12.5)
Platelets: 230 10*3/uL (ref 140–400)
RBC: 4.11 MIL/uL (ref 3.80–5.10)
RDW: 13.3 % (ref 11.0–15.0)
WBC: 7.6 10*3/uL (ref 3.8–10.8)

## 2015-10-08 LAB — TSH: TSH: 2.51 mIU/L

## 2015-10-08 MED ORDER — MEDROXYPROGESTERONE ACETATE 10 MG PO TABS: 10.0000 mg | ORAL_TABLET | Freq: Every day | ORAL | Status: DC

## 2015-10-08 NOTE — Progress Notes (Signed)
   Subjective:    Patient ID: Amber Foster, female    DOB: 10/14/1980, 35 y.o.   MRN: [id number]  HPI  35 yo MW P3 here to discuss her DUB and pelvic pain. She has taken almost 2 full packs of OCPs and reports that the pain is some better but she still gets cramps on occasion, especially hard when she is at work ([employer]). The bleeding is still "crazy". In the last 2 months she started a period 09-03-15 and bled until 09-08-15, started bleeding again 09-17-15 and bled until about 09-30-15. Sex is still painful.  Review of Systems Until taking the pill in the last 2 months, she has never used contraception. She is considering another pregnancy She had gestational diabetes with 2 pregnancies. She passed the 2 hour glucola. Says that she uses her glucometer and has all normal sugars.     Objective:   Physical Exam [id number] Breathing, conversing, and ambulating normally Abd- benign       Assessment & Plan:  Elevated random sugar 3/17- rec HBA1C DUB- re check CBC Offered higher dose monophasic OCP versus cyclic progestin. Because she has nausea with the OCPs, she would like to try the progestin She understands that this is NOT contraception. Rec MVI daily to help prevent ONTDs

## 2015-10-08 NOTE — Progress Notes (Signed)
Here today to follow-up for AUB and pain, states pain is a little better, bleeding has been about the same for the month of April and experiences some nausea with the pill.

## 2015-10-09 LAB — HEMOGLOBIN A1C
Hgb A1c MFr Bld: 6.1 % — ABNORMAL HIGH (ref <5.7)
Mean Plasma Glucose: 128 mg/dL

## 2015-10-09 LAB — VITAMIN D 25 HYDROXY (VIT D DEFICIENCY, FRACTURES): Vit D, 25-Hydroxy: 21 ng/mL — ABNORMAL LOW (ref 30–100)

## 2015-10-10 ENCOUNTER — Telehealth: Payer: Self-pay | Admitting: *Deleted

## 2015-10-10 NOTE — Telephone Encounter (Signed)
-----   Message from Olevia BowensJacinda S Battle sent at 10/09/2015  2:26 PM EDT ----- Regarding: Lab results Had questions about yesterday's lab results and when to start meds

## 2015-10-10 NOTE — Telephone Encounter (Signed)
Called pt to go over lab results - advised pt to start MTV and additional Vitamin D to help with Vit D level, also to follow up with PCP about A1c result being elevated. Pt also wanted to know when to start Provera. Advised pt to start when her cycle started and continue for 10 days. Pt states she started her cycle this morning so she will start Provera today. Pt is to call once she establishes care with PCP.

## 2015-10-11 ENCOUNTER — Telehealth: Payer: Self-pay | Admitting: *Deleted

## 2015-10-11 DIAGNOSIS — R7989 Other specified abnormal findings of blood chemistry: Secondary | ICD-10-CM

## 2015-10-11 MED ORDER — VITAMIN D (ERGOCALCIFEROL) 1.25 MG (50000 UNIT) PO CAPS: 50000.0000 [IU] | ORAL_CAPSULE | ORAL | Status: DC

## 2015-10-11 NOTE — Telephone Encounter (Signed)
Called pt to adv Vit D Rx sent to pharm. Pt expressed understanding.

## 2015-10-11 NOTE — Telephone Encounter (Signed)
-----   Message from Allie BossierMyra C Dove, MD sent at 10/11/2015  8:34 AM EDT ----- Make sure her extra vitamin D is the 50,000 weekly for 8 weeks and then a recheck. Thanks

## 2015-12-04 ENCOUNTER — Telehealth: Payer: Self-pay

## 2015-12-04 NOTE — Telephone Encounter (Signed)
Seen in the office 10/2015. Dr. Marice Potterove wanted to follow up w/ her in 3 months, called patient to schedule this appt, no answer, left voice mail instructing patient to return my call here at the office

## 2017-06-04 ENCOUNTER — Encounter (HOSPITAL_COMMUNITY): Payer: Self-pay | Admitting: Family Medicine

## 2017-06-04 ENCOUNTER — Ambulatory Visit (HOSPITAL_COMMUNITY)
Admission: EM | Admit: 2017-06-04 | Discharge: 2017-06-04 | Disposition: A | Discharge status: Home / Self Care | Payer: 59 | Attending: Internal Medicine | Admitting: Internal Medicine

## 2017-06-04 DIAGNOSIS — J020 Streptococcal pharyngitis: Secondary | ICD-10-CM | POA: Diagnosis not present

## 2017-06-04 DIAGNOSIS — J029 Acute pharyngitis, unspecified: Secondary | ICD-10-CM | POA: Diagnosis present

## 2017-06-04 DIAGNOSIS — R509 Fever, unspecified: Secondary | ICD-10-CM | POA: Diagnosis present

## 2017-06-04 LAB — POCT RAPID STREP A: Streptococcus, Group A Screen (Direct): NEGATIVE

## 2017-06-04 MED ORDER — HYDROCODONE-HOMATROPINE 5-1.5 MG/5ML PO SYRP: 5.0000 mL | ORAL_SOLUTION | Freq: Four times a day (QID) | ORAL | 0 refills | Status: DC | PRN

## 2017-06-04 MED ORDER — IBUPROFEN 800 MG PO TABS
ORAL_TABLET | ORAL | Status: AC
  Filled 2017-06-04: qty 1

## 2017-06-04 MED ORDER — AMOXICILLIN 500 MG PO CAPS: 500.0000 mg | ORAL_CAPSULE | Freq: Three times a day (TID) | ORAL | 0 refills | Status: DC

## 2017-06-04 MED ORDER — IBUPROFEN 800 MG PO TABS
800.0000 mg | ORAL_TABLET | Freq: Once | ORAL | Status: AC
  Administered 2017-06-04: 800 mg via ORAL

## 2017-06-04 NOTE — ED Provider Notes (Signed)
[facility]    CSN: [id number] Arrival date & time: 06/04/17  1223     History   Chief Complaint Chief Complaint  Patient presents with  . Sore Throat  . Fever    HPI Amber Foster is a 36 y.o. female.   36 year old female, with no significant past medical history, presenting today complaining of sore throat.  Symptoms started 2 days ago.  Older son was recently diagnosed with strep.  She has had fever, chills, nasal congestion and sore throat as well as nonproductive cough.  She has been taking Tylenol and ibuprofen at home.   The history is provided by the patient.  Sore Throat  This is a new problem. The problem occurs constantly. Pertinent negatives include no chest pain, no abdominal pain and no shortness of breath. Nothing aggravates the symptoms. The symptoms are relieved by acetaminophen and NSAIDs. She has tried ASA for the symptoms. The treatment provided mild relief.    Past Medical History:  Diagnosis Date  . Back pain   . Congenital anomaly of spinal meninges (HCC)   . Gestational diabetes    during 2nd pregnancy after gallbladder removed  . Headache(784.0)   . Polycystic ovarian syndrome 10/2014  . UTI (lower urinary tract infection)   . Yeast infection     Patient Active Problem List   Diagnosis Date Noted  . Abnormal uterine bleeding 08/14/2015  . Dyspareunia in female 08/14/2015  . PCOS (polycystic ovarian syndrome) 05/07/2015  . Adnexal pain 11/08/2014  . History of gestational diabetes 07/28/2013    Past Surgical History:  Procedure Laterality Date  . CHOLECYSTECTOMY    . HYSTEROSCOPY N/A 07/25/2015   Procedure: HYSTEROSCOPY;  Surgeon: Reva Bores, MD;  Location: [facility];  Service: Gynecology;  Laterality: N/A;  . LAPAROSCOPY N/A 07/25/2015   Procedure: LAPAROSCOPY DIAGNOSTIC, POSTERIOR CUL DE SAC BIOPSY;  Surgeon: Reva Bores, MD;  Location: [facility];  Service: Gynecology;  Laterality: N/A;  . TONSILLECTOMY    . WISDOM TOOTH  EXTRACTION      OB History    Gravida Para Term Preterm AB Living   3 3 3     3    SAB TAB Ectopic Multiple Live Births           3       Home Medications    Prior to Admission medications   Medication Sig Start Date End Date Taking? Authorizing Provider  amoxicillin (AMOXIL) 500 MG capsule Take 1 capsule (500 mg total) by mouth 3 (three) times daily. 06/04/17   Toretto Tingler C, PA-C  HYDROcodone-homatropine (HYCODAN) 5-1.5 MG/5ML syrup Take 5 mLs by mouth every 6 (six) hours as needed for cough. 06/04/17   Suyash Amory C, PA-C  medroxyPROGESTERone (PROVERA) 10 MG tablet Take 1 tablet (10 mg total) by mouth daily. Use for ten days 10/08/15   Allie Bossier, MD  Vitamin D, Ergocalciferol, (DRISDOL) 50000 units CAPS capsule Take 1 capsule (50,000 Units total) by mouth every 7 (seven) days. 10/11/15   Allie Bossier, MD    Family History Family History  Problem Relation Age of Onset  . Hypertension Mother   . Cancer Paternal Grandfather        brain tumor  . Seizures Sister   . Bipolar disorder Sister     Social History Social History   Tobacco Use  . Smoking status: Never Smoker  . Smokeless tobacco: Never Used  Substance Use Topics  . Alcohol use: No  .  Drug use: No     Allergies   Other   Review of Systems Review of Systems  Constitutional: Positive for chills and fever.  HENT: Positive for congestion and sore throat. Negative for ear pain.   Eyes: Negative for pain and visual disturbance.  Respiratory: Positive for cough. Negative for shortness of breath.   Cardiovascular: Negative for chest pain and palpitations.  Gastrointestinal: Negative for abdominal pain and vomiting.  Genitourinary: Negative for dysuria and hematuria.  Musculoskeletal: Negative for arthralgias and back pain.  Skin: Negative for color change and rash.  Neurological: Negative for seizures and syncope.  All other systems reviewed and are negative.    Physical Exam Triage Vital Signs ED  Triage Vitals [06/04/17 1257]  Enc Vitals Group     BP 130/84     Pulse Rate 100     Resp 18     Temp (!) 101.1 F (38.4 C)     Temp src      SpO2 99 %     Weight      Height      Head Circumference      Peak Flow      Pain Score      Pain Loc      Pain Edu?      Excl. in GC?    No data found.  Updated Vital Signs BP 130/84   Pulse 100   Temp (!) 101.1 F (38.4 C)   Resp 18   SpO2 99%   Visual Acuity Right Eye Distance:   Left Eye Distance:   Bilateral Distance:    Right Eye Near:   Left Eye Near:    Bilateral Near:     Physical Exam  Constitutional: She is oriented to person, place, and time. She appears well-developed and well-nourished. No distress.  HENT:  Head: Normocephalic.  Right Ear: Hearing, tympanic membrane, external ear and ear canal normal.  Left Ear: Hearing, tympanic membrane, external ear and ear canal normal.  Nose: Nose normal.  Mouth/Throat: Uvula is midline. Posterior oropharyngeal edema and posterior oropharyngeal erythema present. No oropharyngeal exudate or tonsillar abscesses.  Eyes: EOM are normal. Pupils are equal, round, and reactive to light.  Neck: Normal range of motion.  Cardiovascular: Normal rate.  Pulmonary/Chest: Effort normal and breath sounds normal.  Abdominal: Soft.  Musculoskeletal: Normal range of motion.  Neurological: She is alert and oriented to person, place, and time.  Skin: Skin is warm. She is not diaphoretic.  Psychiatric: She has a normal mood and affect.  Nursing note and vitals reviewed.    UC Treatments / Results  Labs (all labs ordered are listed, but only abnormal results are displayed) Labs Reviewed  CULTURE, GROUP A STREP [facility]  POCT RAPID STREP A    EKG  EKG Interpretation None       Radiology No results found.  Procedures Procedures (including critical care time)  Medications Ordered in UC Medications  ibuprofen (ADVIL,MOTRIN) tablet 800 mg (800 mg Oral Given 06/04/17 1304)      Initial Impression / Assessment and Plan / UC Course  I have reviewed the triage vital signs and the nursing notes.  Pertinent labs & imaging results that were available during my care of the patient were reviewed by me and considered in my medical decision making (see chart for details).     Rapid strep negative.  Son recently diagnosed with strep.  Will treat patient with amoxicillin.  Recommended Tylenol/Motrin for fever.  Prescribed  Hycodan as well for cough and sore throat  Final Clinical Impressions(s) / UC Diagnoses   Final diagnoses:  Strep pharyngitis    ED Discharge Orders        Ordered    amoxicillin (AMOXIL) 500 MG capsule  3 times daily     06/04/17 1342    HYDROcodone-homatropine (HYCODAN) 5-1.5 MG/5ML syrup  Every 6 hours PRN     06/04/17 1342       Controlled Substance Prescriptions [place] Controlled Substance Registry consulted? Yes, I have consulted the  Controlled Substances Registry for this patient, and feel the risk/benefit ratio today is favorable for proceeding with this prescription for a controlled substance.   Alecia LemmingBlue, [place] C, [place] JerseyPA-C 06/04/17 1427

## 2017-06-04 NOTE — ED Triage Notes (Signed)
Pt here for fever and sore throat. Took tylenol this am. Reports oldest child was dx with strep.

## 2017-06-07 LAB — CULTURE, GROUP A STREP (THRC)

## 2017-11-04 ENCOUNTER — Other Ambulatory Visit: Payer: Self-pay

## 2017-11-04 ENCOUNTER — Emergency Department
Admission: EM | Admit: 2017-11-04 | Discharge: 2017-11-04 | Disposition: A | Discharge status: Home / Self Care | Payer: Self-pay | Attending: Emergency Medicine | Admitting: Emergency Medicine

## 2017-11-04 ENCOUNTER — Emergency Department: Payer: Self-pay

## 2017-11-04 DIAGNOSIS — R102 Pelvic and perineal pain: Secondary | ICD-10-CM | POA: Insufficient documentation

## 2017-11-04 DIAGNOSIS — Z3A01 Less than 8 weeks gestation of pregnancy: Secondary | ICD-10-CM | POA: Insufficient documentation

## 2017-11-04 DIAGNOSIS — N939 Abnormal uterine and vaginal bleeding, unspecified: Secondary | ICD-10-CM

## 2017-11-04 DIAGNOSIS — O200 Threatened abortion: Secondary | ICD-10-CM

## 2017-11-04 LAB — CBC
HCT: 38.5 % (ref 35.0–47.0)
Hemoglobin: 12.8 g/dL (ref 12.0–16.0)
MCH: 28.8 pg (ref 26.0–34.0)
MCHC: 33.2 g/dL (ref 32.0–36.0)
MCV: 86.7 fL (ref 80.0–100.0)
Platelets: 240 10*3/uL (ref 150–440)
RBC: 4.44 MIL/uL (ref 3.80–5.20)
RDW: 15.4 % — ABNORMAL HIGH (ref 11.5–14.5)
WBC: 7.6 10*3/uL (ref 3.6–11.0)

## 2017-11-04 LAB — ABO/RH: ABO/RH(D): A POS

## 2017-11-04 LAB — HCG, QUANTITATIVE, PREGNANCY: hCG, Beta Chain, Quant, S: 5 m[IU]/mL — ABNORMAL HIGH (ref <5)

## 2017-11-04 NOTE — ED Notes (Signed)
Patient transported to Ultrasound 

## 2017-11-04 NOTE — ED Notes (Signed)
First Nurse Note:  Patient states "I just don't feel right", has had positive pregnancy tests and started having vaginal bleeding this AM.  Declines WC.

## 2017-11-04 NOTE — Discharge Instructions (Addendum)
As we discussed your work-up in the emergency department is most consistent with a miscarriage.  Please recheck a home pregnancy test in 3 or 4 days to ensure that it remains negative.  If you begin having abdominal pain please return to the emergency department for evaluation.  Otherwise please follow-up with your doctor in the next several days for recheck.

## 2017-11-04 NOTE — ED Provider Notes (Signed)
[facility] Emergency Department Provider Note  Time seen: 8:11 AM  I have reviewed the triage vital signs and the nursing notes.   HISTORY  Chief Complaint Vaginal Bleeding    HPI Amber Foster is a 37 y.o. female G4 P3 who presents to the emergency department with vaginal bleeding.  According to the patient she was several weeks late on her period so she took 3 pregnancy test this week which were positive.  States 2 days ago she was having abdominal pain and cramping which is now resolved but this morning she awoke with vaginal bleeding took a pregnancy test and it was negative.  Patient denies any pain, states very light vaginal bleeding currently.  Patient states nausea yesterday but none since.   Past Medical History:  Diagnosis Date  . Back pain   . Congenital anomaly of spinal meninges (HCC)   . Gestational diabetes    during 2nd pregnancy after gallbladder removed  . Headache(784.0)   . Polycystic ovarian syndrome 10/2014  . UTI (lower urinary tract infection)   . Yeast infection     Patient Active Problem List   Diagnosis Date Noted  . Abnormal uterine bleeding 08/14/2015  . Dyspareunia in female 08/14/2015  . PCOS (polycystic ovarian syndrome) 05/07/2015  . Adnexal pain 11/08/2014  . History of gestational diabetes 07/28/2013    Past Surgical History:  Procedure Laterality Date  . CHOLECYSTECTOMY    . HYSTEROSCOPY N/A 07/25/2015   Procedure: HYSTEROSCOPY;  Surgeon: Reva Bores, MD;  Location: [facility];  Service: Gynecology;  Laterality: N/A;  . LAPAROSCOPY N/A 07/25/2015   Procedure: LAPAROSCOPY DIAGNOSTIC, POSTERIOR CUL DE SAC BIOPSY;  Surgeon: Reva Bores, MD;  Location: [facility];  Service: Gynecology;  Laterality: N/A;  . TONSILLECTOMY    . WISDOM TOOTH EXTRACTION      Prior to Admission medications   Medication Sig Start Date End Date Taking? Authorizing Provider  amoxicillin (AMOXIL) 500 MG capsule Take 1 capsule (500 mg total) by  mouth 3 (three) times daily. 06/04/17   Blue, Olivia C, PA-C  HYDROcodone-homatropine (HYCODAN) 5-1.5 MG/5ML syrup Take 5 mLs by mouth every 6 (six) hours as needed for cough. 06/04/17   Blue, Olivia C, PA-C  medroxyPROGESTERone (PROVERA) 10 MG tablet Take 1 tablet (10 mg total) by mouth daily. Use for ten days 10/08/15   Allie Bossier, MD  Vitamin D, Ergocalciferol, (DRISDOL) 50000 units CAPS capsule Take 1 capsule (50,000 Units total) by mouth every 7 (seven) days. 10/11/15   Allie Bossier, MD    Allergies  Allergen Reactions  . Other Other (See Comments)    Pt prefers no liquid medications, causes shaking    Family History  Problem Relation Age of Onset  . Hypertension Mother   . Cancer Paternal Grandfather        brain tumor  . Seizures Sister   . Bipolar disorder Sister     Social History Social History   Tobacco Use  . Smoking status: Never Smoker  . Smokeless tobacco: Never Used  Substance Use Topics  . Alcohol use: No  . Drug use: No    Review of Systems Constitutional: Negative for fever. Eyes: Negative for visual complaints ENT: Negative for recent illness/congestion Cardiovascular: Negative for chest pain. Respiratory: Negative for shortness of breath. Gastrointestinal: Mild lower abdominal pain/cramping yesterday, now gone.  Positive for nausea yesterday, now gone. Genitourinary: Several weeks late on her menstrual period.  Vaginal bleeding. Musculoskeletal: Negative for musculoskeletal  complaints Skin: Negative for skin complaints  Neurological: Negative for headache All other ROS negative  ____________________________________________   PHYSICAL EXAM:  VITAL SIGNS: ED Triage Vitals  Enc Vitals Group     BP 11/04/17 0748 137/71     Pulse Rate 11/04/17 0748 71     Resp 11/04/17 0748 15     Temp 11/04/17 0748 98.2 F (36.8 C)     Temp Source 11/04/17 0748 Oral     SpO2 11/04/17 0748 99 %     Weight 11/04/17 0749 170 lb (77.1 kg)     Height 11/04/17  0749  (1.626 m)     Head Circumference --      Peak Flow --      Pain Score 11/04/17 0749 0     Pain Loc --      Pain Edu? --      Excl. in GC? --     Constitutional: Alert and oriented. Well appearing and in no distress. Eyes: Normal exam ENT   Head: Normocephalic and atraumatic.   Mouth/Throat: Mucous membranes are moist. Cardiovascular: Normal rate, regular rhythm. No murmur Respiratory: Normal respiratory effort without tachypnea nor retractions. Breath sounds are clear  Gastrointestinal: Soft and nontender. No distention. Musculoskeletal: Nontender with normal range of motion in all extremities.  Neurologic:  Normal speech and language. No gross focal neurologic deficits Skin:  Skin is warm, dry and intact.  Psychiatric: Mood and affect are normal.   ____________________________________________   RADIOLOGY  Ultrasound shows no intrauterine pregnancy.  Thickened endometrium.  ____________________________________________   INITIAL IMPRESSION / ASSESSMENT AND PLAN / ED COURSE  Pertinent labs & imaging results that were available during my care of the patient were reviewed by me and considered in my medical decision making (see chart for details).  Patient presents to the emergency department for vaginal bleeding several weeks late on her menstrual period.  Differential this time would include miscarriage, early pregnancy, false positive patency test at home, ectopic.  We will check labs including hCG ABO/Rh.  We will obtain an ultrasound and continue to closely monitor.\  Ultrasound shows no intrauterine pregnancy but thickened endometrium.  Given the patient's positive pregnancy test and now negative pregnancy test at home with a beta-hCG of 5 in the emergency department I believe the most likely diagnosis is an early miscarriage.  I discussed with patient rechecking a pregnancy test in 3 or 4 days at home to ensure it remains negative I also discussed return  precautions for any abdominal pain.  Patient's blood type is a positive, no need for RhoGam.  ____________________________________________   FINAL CLINICAL IMPRESSION(S) / ED DIAGNOSES  Vaginal bleeding    Minna Antis, MD 11/04/17 1058

## 2017-11-04 NOTE — ED Triage Notes (Signed)
Pt states that she is having vaginal bleeding and over the past week has had 3 positive pregnancy test but the one that she took this am was negative - she reports feeling tired and weak

## 2018-02-28 ENCOUNTER — Other Ambulatory Visit: Payer: Self-pay

## 2018-02-28 ENCOUNTER — Emergency Department (HOSPITAL_COMMUNITY): Payer: Self-pay

## 2018-02-28 ENCOUNTER — Encounter (HOSPITAL_COMMUNITY): Payer: Self-pay | Admitting: Emergency Medicine

## 2018-02-28 ENCOUNTER — Emergency Department (HOSPITAL_COMMUNITY)
Admission: EM | Admit: 2018-02-28 | Discharge: 2018-02-28 | Disposition: A | Discharge status: Home / Self Care | Payer: Self-pay | Attending: Emergency Medicine | Admitting: Emergency Medicine

## 2018-02-28 DIAGNOSIS — R410 Disorientation, unspecified: Secondary | ICD-10-CM | POA: Insufficient documentation

## 2018-02-28 DIAGNOSIS — R0602 Shortness of breath: Secondary | ICD-10-CM | POA: Insufficient documentation

## 2018-02-28 DIAGNOSIS — R5383 Other fatigue: Secondary | ICD-10-CM | POA: Insufficient documentation

## 2018-02-28 DIAGNOSIS — R51 Headache: Secondary | ICD-10-CM | POA: Insufficient documentation

## 2018-02-28 LAB — CBC WITH DIFFERENTIAL/PLATELET
Basophils Absolute: 0 10*3/uL (ref 0.0–0.1)
Basophils Relative: 0 %
Eosinophils Absolute: 0.1 10*3/uL (ref 0.0–0.7)
Eosinophils Relative: 2 %
HCT: 41.2 % (ref 36.0–46.0)
Hemoglobin: 13.6 g/dL (ref 12.0–15.0)
Lymphocytes Relative: 30 %
Lymphs Abs: 2.2 10*3/uL (ref 0.7–4.0)
MCH: 30 pg (ref 26.0–34.0)
MCHC: 33 g/dL (ref 30.0–36.0)
MCV: 90.9 fL (ref 78.0–100.0)
Monocytes Absolute: 0.3 10*3/uL (ref 0.1–1.0)
Monocytes Relative: 5 %
Neutro Abs: 4.5 10*3/uL (ref 1.7–7.7)
Neutrophils Relative %: 63 %
Platelets: 273 10*3/uL (ref 150–400)
RBC: 4.53 MIL/uL (ref 3.87–5.11)
RDW: 13.6 % (ref 11.5–15.5)
WBC: 7.1 10*3/uL (ref 4.0–10.5)

## 2018-02-28 LAB — COMPREHENSIVE METABOLIC PANEL
ALT: 42 U/L (ref 0–44)
AST: 34 U/L (ref 15–41)
Albumin: 4 g/dL (ref 3.5–5.0)
Alkaline Phosphatase: 84 U/L (ref 38–126)
Anion gap: 11 (ref 5–15)
BUN: 10 mg/dL (ref 6–20)
CO2: 27 mmol/L (ref 22–32)
Calcium: 9.5 mg/dL (ref 8.9–10.3)
Chloride: 104 mmol/L (ref 98–111)
Creatinine, Ser: 0.61 mg/dL (ref 0.44–1.00)
GFR calc Af Amer: >60 mL/min (ref >60)
GFR calc non Af Amer: >60 mL/min (ref >60)
Glucose, Bld: 126 mg/dL — ABNORMAL HIGH (ref 70–99)
Potassium: 3.6 mmol/L (ref 3.5–5.1)
Sodium: 142 mmol/L (ref 135–145)
Total Bilirubin: 0.4 mg/dL (ref 0.3–1.2)
Total Protein: 7.9 g/dL (ref 6.5–8.1)

## 2018-02-28 LAB — URINALYSIS, ROUTINE W REFLEX MICROSCOPIC
Bilirubin Urine: NEGATIVE
Glucose, UA: NEGATIVE mg/dL
Ketones, ur: NEGATIVE mg/dL
Leukocytes, UA: NEGATIVE
Nitrite: NEGATIVE
Protein, ur: NEGATIVE mg/dL
Specific Gravity, Urine: 1.014 (ref 1.005–1.030)
pH: 7 (ref 5.0–8.0)

## 2018-02-28 LAB — I-STAT BETA HCG BLOOD, ED (MC, WL, AP ONLY): I-stat hCG, quantitative: <5 m[IU]/mL (ref <5)

## 2018-02-28 LAB — BRAIN NATRIURETIC PEPTIDE: B Natriuretic Peptide: 23.3 pg/mL (ref 0.0–100.0)

## 2018-02-28 LAB — TSH: TSH: 2.435 u[IU]/mL (ref 0.350–4.500)

## 2018-02-28 LAB — PREGNANCY, URINE: Preg Test, Ur: NEGATIVE

## 2018-02-28 NOTE — ED Provider Notes (Signed)
[facility] Emergency Department Provider Note MRN:  [id number]  Arrival date & time: 02/28/18     Chief Complaint   Fatigue and Headache   History of Present Illness   Amber Foster is a 37 y.o. year-old female with a history of PCOS presenting to the ED with chief complaint of fatigue and headache.  Patient began experiencing malaise and fatigue, low energy 2 months ago.  2 weeks ago she experienced an atypical migraine.  Patient has history of migraines but this was abnormal.  The pain was not severe, but was associated with the trouble finding her words, confusion.  Explained that she continues to feel "fuzzy".  Also endorsing intermittent left flank pain, not currently present.  Endorsing intermittent atypical vaginal bleeding, not currently present.  Endorsing shortness of breath, worse when laying flat.  Denies chest pain, no recent fevers, no vision change, no numbness weakness in the arms or legs, intermittent dysuria.  Review of Systems  A complete 10 system review of systems was obtained and all systems are negative except as noted in the HPI and PMH.  Patient's Health History    Past Medical History:  Diagnosis Date  . Back pain   . Congenital anomaly of spinal meninges ([place])   . Gestational diabetes    during 2nd pregnancy after gallbladder removed  . Headache(784.0)   . Polycystic ovarian syndrome 10/2014  . UTI (lower urinary tract infection)   . Yeast infection     Past Surgical History:  Procedure Laterality Date  . CHOLECYSTECTOMY    . HYSTEROSCOPY N/A 07/25/2015   Procedure: HYSTEROSCOPY;  Surgeon: Donnamae Jude, MD;  Location: [place] [facility];  Service: Gynecology;  Laterality: N/A;  . LAPAROSCOPY N/A 07/25/2015   Procedure: LAPAROSCOPY DIAGNOSTIC, POSTERIOR CUL DE [place] BIOPSY;  Surgeon: Donnamae Jude, MD;  Location: [place] [facility];  Service: Gynecology;  Laterality: N/A;  . TONSILLECTOMY    . WISDOM TOOTH EXTRACTION      Family History  Problem Relation Age  of Onset  . Hypertension Mother   . Cancer Paternal Grandfather        brain tumor  . Seizures Sister   . Bipolar disorder Sister     Social History   Socioeconomic History  . Marital status: Married    Spouse name: Not on file  . Number of children: Not on file  . Years of education: Not on file  . Highest education level: Not on file  Occupational History  . Not on file  Social Needs  . Financial resource strain: Not on file  . Food insecurity:    Worry: Not on file    Inability: Not on file  . Transportation needs:    Medical: Not on file    Non-medical: Not on file  Tobacco Use  . Smoking status: Never Smoker  . Smokeless tobacco: Never Used  Substance and Sexual Activity  . Alcohol use: No  . Drug use: No  . Sexual activity: Yes    Birth control/protection: None  Lifestyle  . Physical activity:    Days per week: Not on file    Minutes per session: Not on file  . Stress: Not on file  Relationships  . Social connections:    Talks on phone: Not on file    Gets together: Not on file    Attends religious service: Not on file    Active member of club or organization: Not on file    Attends meetings of  clubs or organizations: Not on file    Relationship status: Not on file  . Intimate partner violence:    Fear of current or ex partner: Not on file    Emotionally abused: Not on file    Physically abused: Not on file    Forced sexual activity: Not on file  Other Topics Concern  . Not on file  Social History Narrative  . Not on file     Physical Exam  Vital Signs and Nursing Notes reviewed Vitals:   02/28/18 1549 02/28/18 1941  BP: (!) 149/93 (!) 109/57  Pulse: 82 63  Resp: 19 18  Temp: 98.9 F (37.2 C) 97.7 F (36.5 C)  SpO2: 96% 98%    CONSTITUTIONAL: Well-appearing, NAD NEURO:  Alert and oriented x 3, no focal deficits EYES:  eyes equal and reactive ENT/NECK:  no LAD, no JVD CARDIO: Regular rate, well-perfused, normal S1 and S2 PULM:  CTAB no  wheezing or rhonchi GI/GU:  normal bowel sounds, non-distended, non-tender MSK/SPINE:  No gross deformities, no edema SKIN:  no rash, atraumatic PSYCH:  Appropriate speech and behavior  Diagnostic and Interventional Summary    EKG Interpretation  Date/Time:    Ventricular Rate:    PR Interval:    QRS Duration:   QT Interval:    QTC Calculation:   R Axis:     Text Interpretation:        Labs Reviewed  COMPREHENSIVE METABOLIC PANEL - Abnormal; Notable for the following components:      Result Value   Glucose, Bld 126 (*)    All other components within normal limits  URINALYSIS, ROUTINE W REFLEX MICROSCOPIC - Abnormal; Notable for the following components:   APPearance TURBID (*)    Hgb urine dipstick MODERATE (*)    Bacteria, UA RARE (*)    All other components within normal limits  CBC WITH DIFFERENTIAL/PLATELET  TSH  PREGNANCY, URINE  BRAIN NATRIURETIC PEPTIDE  I-STAT BETA HCG BLOOD, ED ([facility], [facility], [facility] ONLY)    CT HEAD WO CONTRAST  Final Result    DG Chest 2 View  Final Result      Medications - No data to display   Procedures Critical Care  ED Course and Medical Decision Making  I have reviewed the triage vital signs and the nursing notes.  Pertinent labs & imaging results that were available during my care of the patient were reviewed by me and considered in my medical decision making (see below for details).    Myriad of symptoms today and this 37 year old female.  Shortness of breath, worse when laying flat, clinically with no evidence to suggest heart failure.  Not tachycardic, satting well on room air, no evidence of DVT on exam, PERC negative.  Abdomen soft nontender, lungs clear.  Most notable symptom being behavioral change, unexplained confusion related to headache 2 weeks ago.  Unlikely but cannot exclude neoplastic process, will screen with CT head, labs, chest x-ray.  Work-up unremarkable, patient will follow-up with PCP.  After the discussed  management above, the patient was determined to be safe for discharge.  The patient was in agreement with this plan and all questions regarding their care were answered.  ED return precautions were discussed and the patient will return to the ED with any significant worsening of condition.  Barth Kirks. Sedonia Small, [place] [email or web address]  Final Clinical Impressions(s) / ED Diagnoses     ICD-10-CM   1. Fatigue, unspecified  type R53.83   2. SOB (shortness of breath) R06.02 DG Chest 2 View    DG Chest 2 View    ED Discharge Orders    None         Maudie Flakes, MD 02/28/18 2225

## 2018-02-28 NOTE — ED Triage Notes (Addendum)
Pt reports for couple months been tired/fatigued, having headaches, SOb with exertion and has picture on her phone of all the other complaints she has been having for past couple months. Repots was seen at [facility] over the weekend in [place] and told her that her kidney count was elevated as well as her BP.

## 2018-02-28 NOTE — ED Notes (Signed)
ED Provider at bedside. 

## 2018-02-28 NOTE — Discharge Instructions (Addendum)
You were evaluated in the Emergency Department and after careful evaluation, we did not find any emergent condition requiring admission or further testing in the hospital. ° °Please return to the Emergency Department if you experience any worsening of your condition.  We encourage you to follow up with a primary care provider.  Thank you for allowing us to be a part of your care. °

## 2018-04-22 ENCOUNTER — Other Ambulatory Visit: Payer: Self-pay

## 2018-04-22 ENCOUNTER — Emergency Department
Admission: EM | Admit: 2018-04-22 | Discharge: 2018-04-22 | Disposition: A | Discharge status: Home / Self Care | Payer: Medicaid Other | Attending: Emergency Medicine | Admitting: Emergency Medicine

## 2018-04-22 DIAGNOSIS — Q069 Congenital malformation of spinal cord, unspecified: Secondary | ICD-10-CM | POA: Insufficient documentation

## 2018-04-22 DIAGNOSIS — J111 Influenza due to unidentified influenza virus with other respiratory manifestations: Secondary | ICD-10-CM | POA: Insufficient documentation

## 2018-04-22 DIAGNOSIS — R69 Illness, unspecified: Secondary | ICD-10-CM

## 2018-04-22 MED ORDER — ACETAMINOPHEN-CODEINE #3 300-30 MG PO TABS: 1.0000 | ORAL_TABLET | Freq: Four times a day (QID) | ORAL | 0 refills | Status: DC | PRN

## 2018-04-22 MED ORDER — PREDNISONE 10 MG (21) PO TBPK: ORAL_TABLET | ORAL | 0 refills | Status: DC

## 2018-04-22 NOTE — ED Triage Notes (Signed)
States cough since Tuesday. Subjective fevers. No fever in triage. Mask in place. States cough is productive. States congestion.   A&O, ambulatory.

## 2018-04-22 NOTE — ED Provider Notes (Signed)
[facility] Emergency Department Provider Note  ____________________________________________  Time seen: Approximately 7:50 AM  I have reviewed the triage vital signs and the nursing notes.   HISTORY  Chief Complaint Cough   HPI Amber Foster is a 37 y.o. female who presents to the emergency department for treatment and evaluation of cough, "numb" throat, and intermittent earache x 3 days. Cough is productive. Subjective fever. No relief with tylenol or ibuprofen. She denies shortness of breath. Nonsmoker.   Past Medical History:  Diagnosis Date  . Back pain   . Congenital anomaly of spinal meninges (HCC)   . Gestational diabetes    during 2nd pregnancy after gallbladder removed  . Headache(784.0)   . Polycystic ovarian syndrome 10/2014  . UTI (lower urinary tract infection)   . Yeast infection     Patient Active Problem List   Diagnosis Date Noted  . Abnormal uterine bleeding 08/14/2015  . Dyspareunia in female 08/14/2015  . PCOS (polycystic ovarian syndrome) 05/07/2015  . Adnexal pain 11/08/2014  . History of gestational diabetes 07/28/2013    Past Surgical History:  Procedure Laterality Date  . CHOLECYSTECTOMY    . HYSTEROSCOPY N/A 07/25/2015   Procedure: HYSTEROSCOPY;  Surgeon: Reva Bores, MD;  Location: [facility];  Service: Gynecology;  Laterality: N/A;  . LAPAROSCOPY N/A 07/25/2015   Procedure: LAPAROSCOPY DIAGNOSTIC, POSTERIOR CUL DE SAC BIOPSY;  Surgeon: Reva Bores, MD;  Location: [facility];  Service: Gynecology;  Laterality: N/A;  . TONSILLECTOMY    . WISDOM TOOTH EXTRACTION      Prior to Admission medications   Medication Sig Start Date End Date Taking? Authorizing Provider  acetaminophen (TYLENOL) 500 MG tablet Take 1,000 mg by mouth every 6 (six) hours as needed for mild pain.    [provider]  acetaminophen-codeine (TYLENOL #3) 300-30 MG tablet Take 1-2 tablets by mouth every 6 (six) hours as needed (cough). 04/22/18    Emberli Ballester, Rulon Eisenmenger B, FNP  amoxicillin (AMOXIL) 500 MG capsule Take 1 capsule (500 mg total) by mouth 3 (three) times daily. Patient not taking: Reported on 02/28/2018 06/04/17   Blue, Olivia C, PA-C  HYDROcodone-homatropine (HYCODAN) 5-1.5 MG/5ML syrup Take 5 mLs by mouth every 6 (six) hours as needed for cough. Patient not taking: Reported on 02/28/2018 06/04/17   Blue, Zollie Scale C, PA-C  ibuprofen (ADVIL,MOTRIN) 200 MG tablet Take 400 mg by mouth every 6 (six) hours as needed for moderate pain.    [provider]  medroxyPROGESTERone (PROVERA) 10 MG tablet Take 1 tablet (10 mg total) by mouth daily. Use for ten days Patient not taking: Reported on 02/28/2018 10/08/15   Allie Bossier, MD  OVER THE COUNTER MEDICATION Take 2 tablets by mouth daily as needed.    [provider]  predniSONE (STERAPRED UNI-PAK 21 TAB) 10 MG (21) TBPK tablet Take 6 tablets on the first day and decrease by 1 tablet each day until finished. 04/22/18   Regla Fitzgibbon, Rulon Eisenmenger B, FNP  Vitamin D, Ergocalciferol, (DRISDOL) 50000 units CAPS capsule Take 1 capsule (50,000 Units total) by mouth every 7 (seven) days. Patient not taking: Reported on 02/28/2018 10/11/15   Allie Bossier, MD    Allergies Other  Family History  Problem Relation Age of Onset  . Hypertension Mother   . Cancer Paternal Grandfather        brain tumor  . Seizures Sister   . Bipolar disorder Sister     Social History Social History   Tobacco Use  .  Smoking status: Never Smoker  . Smokeless tobacco: Never Used  Substance Use Topics  . Alcohol use: No  . Drug use: No    Review of Systems Constitutional: Positive for fever/chills. Normal appetite. ENT: "Numb" sore throat. Cardiovascular: Denies chest pain. Respiratory: Negative for shortness of breath. Positive for cough. Negative for wheezing.  Gastrointestinal: No nausea,  no vomiting.  Negative for diarrhea.  Musculoskeletal: Positive for body aches Skin: Negative for rash. Neurological:  Positive for headaches ____________________________________________   PHYSICAL EXAM:  VITAL SIGNS: ED Triage Vitals  Enc Vitals Group     BP 04/22/18 0739 (!) 143/70     Pulse Rate 04/22/18 0739 77     Resp 04/22/18 0739 18     Temp 04/22/18 0739 97.7 F (36.5 C)     Temp Source 04/22/18 0739 Oral     SpO2 04/22/18 0739 99 %     Weight 04/22/18 0740 170 lb (77.1 kg)     Height 04/22/18 0740 5\' 3"  (1.6 m)     Head Circumference --      Peak Flow --      Pain Score 04/22/18 0739 0     Pain Loc --      Pain Edu? --      Excl. in GC? --     Constitutional: Alert and oriented. Acutely ill appearing and in no acute distress. Eyes: Conjunctivae are normal. Ears: Bilateral TM normal. Nose: Maxillary and frontal sinus congestion noted; No rhinnorhea. Mouth/Throat: Mucous membranes are moist.  Oropharynx erythematous. Tonsils not visualized. Uvula midline. Neck: No stridor.  Lymphatic: No cervical lymphadenopathy. Cardiovascular: Normal rate, regular rhythm. Good peripheral circulation. Respiratory: Respirations are even and unlabored.  No retractions. Breath sounds clear to auscultation. Gastrointestinal: Soft and nontender.  Musculoskeletal: FROM x 4 extremities.  Neurologic:  Normal speech and language. Skin:  Skin is warm, dry and intact. No rash noted. Psychiatric: Mood and affect are normal. Speech and behavior are normal.  ____________________________________________   LABS (all labs ordered are listed, but only abnormal results are displayed)  Labs Reviewed - No data to display ____________________________________________  EKG  Not indicated. ____________________________________________  RADIOLOGY  Not indicated. ____________________________________________   PROCEDURES  Procedure(s) performed: None  Critical Care performed: No ____________________________________________   INITIAL IMPRESSION / ASSESSMENT AND PLAN / ED COURSE  37 y.o. female who  presents to the emergency department for treatment and evaluation of symptoms and exam consistent with influenza like illness. Symptoms present x 3 days. She does not smoke and breath sounds are clear. She will be treated with tylenol with codeine for cough and prednisone for congestion.     Medications - No data to display  ED Discharge Orders         Ordered    acetaminophen-codeine (TYLENOL #3) 300-30 MG tablet  Every 6 hours PRN     04/22/18 0805    predniSONE (STERAPRED UNI-PAK 21 TAB) 10 MG (21) TBPK tablet     04/22/18 0805           Pertinent labs & imaging results that were available during my care of the patient were reviewed by me and considered in my medical decision making (see chart for details).    If controlled substance prescribed during this visit, 12 month history viewed on the [facility] prior to issuing an initial prescription for Schedule II or III opiod. ____________________________________________   FINAL CLINICAL IMPRESSION(S) / ED DIAGNOSES  Final diagnoses:  Influenza-like illness    Note:  This document was prepared using Dragon voice recognition software and may include unintentional dictation errors.     Chinita Pester, FNP 04/22/18 1610    Governor Rooks, MD 04/22/18 1400

## 2018-04-22 NOTE — ED Notes (Signed)
See triage note  States she developed fever,cough and sore throat on Tuesday. States fever at home was 102    But afebrile on arrival

## 2018-04-22 NOTE — Discharge Instructions (Addendum)
Please follow up with the primary care provider of your choice for symptoms that are not improving over the next week.  Return to the ER for symptoms that change or worsen if unable to schedule an appointment.

## 2018-04-29 ENCOUNTER — Other Ambulatory Visit: Payer: Self-pay

## 2018-04-29 ENCOUNTER — Emergency Department
Admission: EM | Admit: 2018-04-29 | Discharge: 2018-04-29 | Disposition: A | Discharge status: Home / Self Care | Payer: Medicaid Other | Attending: Emergency Medicine | Admitting: Emergency Medicine

## 2018-04-29 ENCOUNTER — Emergency Department: Payer: Medicaid Other

## 2018-04-29 ENCOUNTER — Encounter: Payer: Self-pay | Admitting: Emergency Medicine

## 2018-04-29 DIAGNOSIS — R059 Cough, unspecified: Secondary | ICD-10-CM

## 2018-04-29 DIAGNOSIS — J189 Pneumonia, unspecified organism: Secondary | ICD-10-CM | POA: Insufficient documentation

## 2018-04-29 DIAGNOSIS — R05 Cough: Secondary | ICD-10-CM

## 2018-04-29 DIAGNOSIS — Q069 Congenital malformation of spinal cord, unspecified: Secondary | ICD-10-CM | POA: Insufficient documentation

## 2018-04-29 DIAGNOSIS — Z79899 Other long term (current) drug therapy: Secondary | ICD-10-CM | POA: Insufficient documentation

## 2018-04-29 MED ORDER — CEFTRIAXONE SODIUM 1 G IJ SOLR
1.0000 g | Freq: Once | INTRAMUSCULAR | Status: AC
  Administered 2018-04-29: 1 g via INTRAMUSCULAR
  Filled 2018-04-29: qty 10

## 2018-04-29 MED ORDER — LIDOCAINE HCL (PF) 1 % IJ SOLN
2.1000 mL | Freq: Once | INTRAMUSCULAR | Status: DC
  Filled 2018-04-29: qty 5

## 2018-04-29 MED ORDER — BENZONATATE 200 MG PO CAPS: 200.0000 mg | ORAL_CAPSULE | Freq: Four times a day (QID) | ORAL | 0 refills | Status: DC | PRN

## 2018-04-29 MED ORDER — ALBUTEROL SULFATE HFA 108 (90 BASE) MCG/ACT IN AERS: 2.0000 | INHALATION_SPRAY | RESPIRATORY_TRACT | 0 refills | Status: DC | PRN

## 2018-04-29 MED ORDER — PREDNISONE 50 MG PO TABS: 50.0000 mg | ORAL_TABLET | Freq: Every day | ORAL | 0 refills | Status: DC

## 2018-04-29 MED ORDER — HYDROCOD POLST-CPM POLST ER 10-8 MG/5ML PO SUER: 5.0000 mL | Freq: Two times a day (BID) | ORAL | 0 refills | Status: DC | PRN

## 2018-04-29 MED ORDER — AZITHROMYCIN 250 MG PO TABS: ORAL_TABLET | ORAL | 0 refills | Status: DC

## 2018-04-29 NOTE — ED Triage Notes (Signed)
Pt arrives ambulatory to triage with c/o cough and flu like symptoms since Tuesday. Pt was seen and released from the ED last Friday with diagnosis of viral illness. Pt reports that she feels no better at this time. Pt is able to talk in complete sentences at this time and is in NAD.

## 2018-04-29 NOTE — ED Provider Notes (Signed)
[facility] Emergency Department Provider Note  ____________________________________________  Time seen: Approximately 10:21 PM  I have reviewed the triage vital signs and the nursing notes.   HISTORY  Chief Complaint URI    HPI Amber Foster is a 37 y.o. female who presents the emergency department for ongoing symptoms of nasal congestion, sore throat, cough.  Patient's main complaint at this time is cough.  Patient states that symptoms have been ongoing x10 days.  She was originally evaluated a week ago in the emergency department diagnosed with a viral illness.  Patient received Tylenol with codeine for cough, prednisone.  Patient reports that she is taking this medication with no improvement of symptoms.  Patient reports that she has coughed until "she chokes."  Patient with no headache, neck pain or stiffness, chest pain, abdominal pain, nausea or vomiting.  Over-the-counter medications do not alleviate symptoms.  Patient finished prednisone course with no improvement.    Past Medical History:  Diagnosis Date  . Back pain   . Congenital anomaly of spinal meninges (HCC)   . Gestational diabetes    during 2nd pregnancy after gallbladder removed  . Headache(784.0)   . Polycystic ovarian syndrome 10/2014  . UTI (lower urinary tract infection)   . Yeast infection     Patient Active Problem List   Diagnosis Date Noted  . Abnormal uterine bleeding 08/14/2015  . Dyspareunia in female 08/14/2015  . PCOS (polycystic ovarian syndrome) 05/07/2015  . Adnexal pain 11/08/2014  . History of gestational diabetes 07/28/2013    Past Surgical History:  Procedure Laterality Date  . CHOLECYSTECTOMY    . HYSTEROSCOPY N/A 07/25/2015   Procedure: HYSTEROSCOPY;  Surgeon: Reva Bores, MD;  Location: [facility];  Service: Gynecology;  Laterality: N/A;  . LAPAROSCOPY N/A 07/25/2015   Procedure: LAPAROSCOPY DIAGNOSTIC, POSTERIOR CUL DE SAC BIOPSY;  Surgeon: Reva Bores, MD;   Location: [facility];  Service: Gynecology;  Laterality: N/A;  . TONSILLECTOMY    . WISDOM TOOTH EXTRACTION      Prior to Admission medications   Medication Sig Start Date End Date Taking? Authorizing Provider  acetaminophen (TYLENOL) 500 MG tablet Take 1,000 mg by mouth every 6 (six) hours as needed for mild pain.    [provider]  acetaminophen-codeine (TYLENOL #3) 300-30 MG tablet Take 1-2 tablets by mouth every 6 (six) hours as needed (cough). 04/22/18   Triplett, Rulon Eisenmenger B, FNP  albuterol (PROVENTIL HFA;VENTOLIN HFA) 108 (90 Base) MCG/ACT inhaler Inhale 2 puffs into the lungs every 4 (four) hours as needed for wheezing or shortness of breath. 04/29/18   Cuthriell, Delorise Royals, PA-C  amoxicillin (AMOXIL) 500 MG capsule Take 1 capsule (500 mg total) by mouth 3 (three) times daily. Patient not taking: Reported on 02/28/2018 06/04/17   Blue, Zollie Scale C, PA-C  azithromycin (ZITHROMAX Z-PAK) 250 MG tablet Take 2 tablets (500 mg) on  Day 1,  followed by 1 tablet (250 mg) once daily on Days 2 through 5. 04/29/18   Cuthriell, Delorise Royals, PA-C  benzonatate (TESSALON) 200 MG capsule Take 1 capsule (200 mg total) by mouth 4 (four) times daily as needed for cough. 04/29/18   Cuthriell, Delorise Royals, PA-C  chlorpheniramine-HYDROcodone (TUSSIONEX PENNKINETIC ER) 10-8 MG/5ML SUER Take 5 mLs by mouth every 12 (twelve) hours as needed for cough. 04/29/18   Cuthriell, Delorise Royals, PA-C  HYDROcodone-homatropine (HYCODAN) 5-1.5 MG/5ML syrup Take 5 mLs by mouth every 6 (six) hours as needed for cough. Patient not taking: Reported on  02/28/2018 06/04/17   Blue, Olivia C, PA-C  ibuprofen (ADVIL,MOTRIN) 200 MG tablet Take 400 mg by mouth every 6 (six) hours as needed for moderate pain.    [provider]  medroxyPROGESTERone (PROVERA) 10 MG tablet Take 1 tablet (10 mg total) by mouth daily. Use for ten days Patient not taking: Reported on 02/28/2018 10/08/15   Allie Bossier, MD  OVER THE COUNTER MEDICATION Take 2  tablets by mouth daily as needed.    [provider]  predniSONE (DELTASONE) 50 MG tablet Take 1 tablet (50 mg total) by mouth daily with breakfast. 04/29/18   Cuthriell, Delorise Royals, PA-C  Vitamin D, Ergocalciferol, (DRISDOL) 50000 units CAPS capsule Take 1 capsule (50,000 Units total) by mouth every 7 (seven) days. Patient not taking: Reported on 02/28/2018 10/11/15   Allie Bossier, MD    Allergies Other  Family History  Problem Relation Age of Onset  . Hypertension Mother   . Cancer Paternal Grandfather        brain tumor  . Seizures Sister   . Bipolar disorder Sister     Social History Social History   Tobacco Use  . Smoking status: Never Smoker  . Smokeless tobacco: Never Used  Substance Use Topics  . Alcohol use: No  . Drug use: No     Review of Systems  Constitutional: Positive fever/chills Eyes: No visual changes. No discharge ENT: Positive for nasal congestion and sore throat Cardiovascular: no chest pain. Respiratory: Positive cough. No SOB. Gastrointestinal: No abdominal pain.  No nausea, no vomiting.  No diarrhea.  No constipation. Musculoskeletal: Negative for musculoskeletal pain. Skin: Negative for rash, abrasions, lacerations, ecchymosis. Neurological: Negative for headaches, focal weakness or numbness. 10-point ROS otherwise negative.  ____________________________________________   PHYSICAL EXAM:  VITAL SIGNS: ED Triage Vitals  Enc Vitals Group     BP 04/29/18 2137 (!) 154/94     Pulse Rate 04/29/18 2137 97     Resp 04/29/18 2137 18     Temp 04/29/18 2137 98.2 F (36.8 C)     Temp Source 04/29/18 2137 Oral     SpO2 04/29/18 2137 97 %     Weight 04/29/18 2138 170 lb (77.1 kg)     Height 04/29/18 2138 5\' 3"  (1.6 m)     Head Circumference --      Peak Flow --      Pain Score 04/29/18 2137 7     Pain Loc --      Pain Edu? --      Excl. in GC? --      Constitutional: Alert and oriented. Well appearing and in no acute distress. Eyes:  Conjunctivae are normal. PERRL. EOMI. Head: Atraumatic. ENT:      Ears: EACs and TMs unremarkable bilaterally.      Nose: Mild congestion/rhinnorhea.      Mouth/Throat: Mucous membranes are moist.  Oropharynx is mildly erythematous but nonedematous.  Uvula is midline. Neck: No stridor.  Neck is supple full range of motion Hematological/Lymphatic/Immunilogical: Diffuse, mobile, anterior cervical lymphadenopathy. Cardiovascular: Normal rate, regular rhythm. Normal S1 and S2.  Good peripheral circulation. Respiratory: Normal respiratory effort without tachypnea or retractions. Lungs with a few scattered rhonchi left lower lung field.  Otherwise, few scattered expiratory wheezes.  No rales or rhonchi to the rest of the lung fields.Peri Jefferson air entry to the bases with no decreased or absent breath sounds. Musculoskeletal: Full range of motion to all extremities. No gross deformities appreciated. Neurologic:  Normal  speech and language. No gross focal neurologic deficits are appreciated.  Skin:  Skin is warm, dry and intact. No rash noted. Psychiatric: Mood and affect are normal. Speech and behavior are normal. Patient exhibits appropriate insight and judgement.   ____________________________________________   LABS (all labs ordered are listed, but only abnormal results are displayed)  Labs Reviewed - No data to display ____________________________________________  EKG   ____________________________________________  RADIOLOGY I personally viewed and evaluated these images as part of my medical decision making, as well as reviewing the written report by the radiologist.  Dg Chest 2 View  Result Date: 04/29/2018 CLINICAL DATA:  Cough and flu-like symptoms since Tuesday. EXAM: CHEST - 2 VIEW COMPARISON:  Chest x-ray dated 02/28/2018. FINDINGS: The heart size and mediastinal contours are within normal limits. Both lungs are clear. The visualized skeletal structures are unremarkable.  IMPRESSION: No active cardiopulmonary disease. No evidence of pneumonia or pulmonary edema. Electronically Signed   By: Bary Richard M.D.   On: 04/29/2018 22:16    ____________________________________________    PROCEDURES  Procedure(s) performed:    Procedures    Medications  lidocaine (PF) (XYLOCAINE) 1 % injection 2.1 mL (has no administration in time range)  cefTRIAXone (ROCEPHIN) injection 1 g (1 g Intramuscular Given 04/29/18 2246)     ____________________________________________   INITIAL IMPRESSION / ASSESSMENT AND PLAN / ED COURSE  Pertinent labs & imaging results that were available during my care of the patient were reviewed by me and considered in my medical decision making (see chart for details).  Review of the [place] CSRS was performed in accordance of the [facility] prior to dispensing any controlled drugs.      Patient's diagnosis is consistent with community acquired pneumonia.  Patient presents the emergency department with ongoing symptoms 10 days after onset.  Patient was treated for viral URI.  Symptoms did not improve with medications.  Patient continues to have fevers, nasal congestion, sore throat, cough.  I suspect community-acquired pneumonia at this time.  Patient will be treated with Z-Pak, additional symptom control medications of Tessalon Perles and hydrocodone cough syrup.  Given wheezing in addition to diagnosis, patient will be also treated with another course of prednisone and albuterol..  No further work-up at this time.  Follow-up with primary care.  Patient is given ED precautions to return to the ED for any worsening or new symptoms.     ____________________________________________  FINAL CLINICAL IMPRESSION(S) / ED DIAGNOSES  Final diagnoses:  Community acquired pneumonia, unspecified laterality      NEW MEDICATIONS STARTED DURING THIS VISIT:  ED Discharge Orders         Ordered    azithromycin (ZITHROMAX Z-PAK) 250 MG tablet      04/29/18 2240    benzonatate (TESSALON) 200 MG capsule  4 times daily PRN     04/29/18 2240    chlorpheniramine-HYDROcodone (TUSSIONEX PENNKINETIC ER) 10-8 MG/5ML SUER  Every 12 hours PRN     04/29/18 2240    predniSONE (DELTASONE) 50 MG tablet  Daily with breakfast     04/29/18 2240    albuterol (PROVENTIL HFA;VENTOLIN HFA) 108 (90 Base) MCG/ACT inhaler  Every 4 hours PRN     04/29/18 2240              This chart was dictated using voice recognition software/Dragon. Despite best efforts to proofread, errors can occur which can change the meaning. Any change was purely unintentional.    Racheal Patches, PA-C 04/29/18 2259  Minna AntisPaduchowski, Kevin, MD 04/29/18 2322

## 2018-06-08 NOTE — L&D Delivery Note (Signed)
Delivery Note  First Stage:  Labor onset: 0730 8/29 Induction: s/p ECV on admission, IOL at 37.1 due to diabetes, s/p cytotec, pitocin, AROM Analgesia /Anesthesia intrapartum: Stadol x 2 doses AROM 8/28 at 2112  Second Stage: Complete dilation at 0934 Onset of pushing at 0935 FHR second stage Cat II, variable decels  Delivery of a viable female infant on 02/04/19 at 0938 by Hassan Buckler CNM delivery of fetal head in OA position with restitution to ROA. No nuchal cord;  Anterior then posterior shoulders delivered easily with gentle downward traction. Baby placed on mom's chest, and attended to by peds.  Cord double clamped after cessation of pulsation, cut by FOB  Third Stage: Placenta delivered spontaneously intact with 3VC @ [phone] Placenta disposition: routine disposal Uterine tone Firm / bleeding scant  1st deg perineal laceration identified  Anesthesia for repair: local Repair 2-0 Vicryl Ct-1 Est. Blood Loss (mL): 782  Complications: none  Mom to postpartum.  Baby to Couplet care / Skin to Skin.  Newborn: Birth Weight: pending  Apgar Scores: 9/9 Feeding planned: breast and formula

## 2018-06-11 DIAGNOSIS — Z3201 Encounter for pregnancy test, result positive: Secondary | ICD-10-CM | POA: Diagnosis not present

## 2018-06-11 DIAGNOSIS — R8290 Unspecified abnormal findings in urine: Secondary | ICD-10-CM | POA: Diagnosis not present

## 2018-06-16 DIAGNOSIS — N912 Amenorrhea, unspecified: Secondary | ICD-10-CM | POA: Diagnosis not present

## 2018-06-30 DIAGNOSIS — O3680X0 Pregnancy with inconclusive fetal viability, not applicable or unspecified: Secondary | ICD-10-CM | POA: Diagnosis not present

## 2018-08-12 DIAGNOSIS — Z3401 Encounter for supervision of normal first pregnancy, first trimester: Secondary | ICD-10-CM | POA: Diagnosis not present

## 2018-08-23 ENCOUNTER — Encounter: Payer: BLUE CROSS/BLUE SHIELD | Attending: Obstetrics and Gynecology | Admitting: *Deleted

## 2018-08-23 ENCOUNTER — Other Ambulatory Visit: Payer: Self-pay

## 2018-08-23 ENCOUNTER — Encounter: Payer: Self-pay | Admitting: *Deleted

## 2018-08-23 VITALS — BP 100/70 | Ht 65.0 in | Wt 176.2 lb

## 2018-08-23 DIAGNOSIS — O24119 Pre-existing diabetes mellitus, type 2, in pregnancy, unspecified trimester: Secondary | ICD-10-CM | POA: Diagnosis not present

## 2018-08-23 DIAGNOSIS — Z3A00 Weeks of gestation of pregnancy not specified: Secondary | ICD-10-CM | POA: Insufficient documentation

## 2018-08-23 NOTE — Patient Instructions (Addendum)
Read booklet on Gestational Diabetes Follow Gestational Meal Planning Guidelines Don't skip meals Include 1 serving of protein and 1 serving of carbohydrate for breakfast and snacks Check blood sugars 4 x day - before breakfast and 2 hrs after every meal and record  Bring blood sugar log to all MD appointments Call MD for prescription for meter strips and lancets Strips One Touch Verio Lancets   One Touch Delica Purchase urine ketone strips if ordered by MD and check urine ketones every am:  If + increase bedtime snack to 1 protein and 2 carbohydrate servings Walk 20-30 minutes at least 5 x week if permitted by MD

## 2018-08-23 NOTE — Progress Notes (Signed)
Diabetes Self-Management Education  Visit Type: First/Initial  Appt. Start Time: 1005 Appt. End Time: 1120  08/23/2018  Amber Foster, identified by name and date of birth, is a 38 y.o. 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   ASSESSMENT  Blood pressure 100/70, height 5\' 5"  (1.651 m), weight 176 lb 3.2 oz (79.9 kg), unknown if currently breastfeeding. Body mass index is 29.32 kg/m.  Diabetes Self-Management Education - 08/23/18 1305      Visit Information   Visit Type  First/Initial      Initial Visit   Diabetes Type  Type 2   Complicated by Pregnancy   Are you currently following a meal plan?  No    Are you taking your medications as prescribed?  Yes    Date Diagnosed  "1 1/2 weeks" - per her recent A1C of 6.6 % she has Diabetes; she had Gestational Diabetes with her last 2 pregnancies      Health Coping   How would you rate your overall health?  Good      Psychosocial Assessment   Patient Belief/Attitude about Diabetes  Other (comment)   "not happy"   Self-care barriers  None    Self-management support  Doctor's office;Family    Other persons present  Other (comment)   3 young children   Patient Concerns  Nutrition/Meal planning;Monitoring;Healthy Lifestyle;Glycemic Control;Problem Solving;Weight Control    Special Needs  None    Preferred Learning Style  Auditory;Visual;Hands on    Learning Readiness  Ready    How often do you need to have someone help you when you read instructions, pamphlets, or other written materials from your doctor or pharmacy?  1 - Never    What is the last grade level you completed in school?  assoc degree      Pre-Education Assessment   Patient understands the diabetes disease and treatment process.  Needs Review    Patient understands incorporating nutritional management into lifestyle.  Needs Review    Patient undertands incorporating physical activity into lifestyle.  Needs Review    Patient understands  using medications safely.  Needs Instruction    Patient understands monitoring blood glucose, interpreting and using results  Needs Review    Patient understands prevention, detection, and treatment of acute complications.  Needs Instruction    Patient understands prevention, detection, and treatment of chronic complications.  Needs Review    Patient understands how to develop strategies to address psychosocial issues.  Needs Instruction    Patient understands how to develop strategies to promote health/change behavior.  Needs Instruction      Complications   Last HgB A1C per patient/outside source  6.6 %   08/12/2018   How often do you check your blood sugar?  0 times/day (not testing)   Provided One Touch Verio Flex meter and instructed on use. BG upon return demonstration was 191 mg/dL at 42:99 am - 1 1/2 hrs pp. Pt had bagel with strawberry cream cheese for breakfast.    Have you had a dilated eye exam in the past 12 months?  Yes    Have you had a dental exam in the past 12 months?  No    Are you checking your feet?  No      Dietary Intake   Breakfast  skips    Lunch  salads, hot dog, left overs - fruit of grapes, strawberries, apples, peaches    Dinner  chicken wings, salmon, beef, occasional pork -  potaotes, bread, beans, peas, corn, rice, pasta, lettuce carrots, cuccumbers, broccoli, greens    Beverage(s)  water, unsweetened tea, drinks sweetened with artificial sweeteners      Exercise   Exercise Type  ADL's      Patient Education   Previous Diabetes Education  Yes (please comment)   previous Gestational Diabetes education   Disease state   Definition of diabetes, type 1 and 2, and the diagnosis of diabetes;Factors that contribute to the development of diabetes;Explored patient's options for treatment of their diabetes    Nutrition management   Role of diet in the treatment of diabetes and the relationship between the three main macronutrients and blood glucose level;Food label  reading, portion sizes and measuring food.;Reviewed blood glucose goals for pre and post meals and how to evaluate the patients' food intake on their blood glucose level.    Physical activity and exercise   Role of exercise on diabetes management, blood pressure control and cardiac health.    Monitoring  Taught/evaluated SMBG meter.;Purpose and frequency of SMBG.;Taught/discussed recording of test results and interpretation of SMBG.;Identified appropriate SMBG and/or A1C goals.;Ketone testing, when, how.    Chronic complications  Relationship between chronic complications and blood glucose control    Psychosocial adjustment  Identified and addressed patients feelings and concerns about diabetes    Preconception care  Pregnancy and GDM  Role of pre-pregnancy blood glucose control on the development of the fetus;Role of family planning for patients with diabetes;Reviewed with patient blood glucose goals with pregnancy      Individualized Goals (developed by patient)   Reducing Risk  Improve blood sugars Prevent diabetes complications Lose weight Lead a healthier lifestyle Become more fit     Outcomes   Expected Outcomes  Demonstrated interest in learning. Expect positive outcomes    Future DMSE  No further appointments scheduled at this time   Program Status  Not Completed       Individualized Plan for Diabetes Self-Management Training:   Learning Objective:  Patient will have a greater understanding of diabetes self-management. Patient education plan is to attend individual and/or group sessions per assessed needs and concerns.   Plan:   Patient Instructions  Read booklet on Gestational Diabetes Follow Gestational Meal Planning Guidelines Don't skip meals Include 1 serving of protein and 1 serving of carbohydrate for breakfast and snacks Check blood sugars 4 x day - before breakfast and 2 hrs after every meal and record  Bring blood sugar log to all MD appointments Call MD for  prescription for meter strips and lancets Strips One Touch Verio Lancets   One Touch Delica Purchase urine ketone strips if ordered by MD and check urine ketones every am:  If + increase bedtime snack to 1 protein and 2 carbohydrate servings Walk 20-30 minutes at least 5 x week if permitted by MD  Expected Outcomes:  Demonstrated interest in learning. Expect positive outcomes  Education material provided:  Gestational Booklet - Management for Mothers-To-Be Gestational Meal Planning Guidelines Simple Meal Plan Meter - One Touch Verio Flex Goals for a Healthy Pregnancy  If problems or questions, patient to contact team via:   Sharion Settler, RN, CCM, CDE [phone]  Future DSME appointment:  Patient doesn't want to return for dietitian appointment at this time. She has attended Gestational Diabetes classes in the past.

## 2018-11-01 ENCOUNTER — Other Ambulatory Visit: Payer: Self-pay

## 2018-11-01 ENCOUNTER — Encounter: Payer: BLUE CROSS/BLUE SHIELD | Attending: Obstetrics and Gynecology | Admitting: Dietician

## 2018-11-01 ENCOUNTER — Encounter: Payer: Self-pay | Admitting: Dietician

## 2018-11-01 VITALS — Ht 61.0 in | Wt 175.2 lb

## 2018-11-01 DIAGNOSIS — O24119 Pre-existing diabetes mellitus, type 2, in pregnancy, unspecified trimester: Secondary | ICD-10-CM | POA: Insufficient documentation

## 2018-11-01 DIAGNOSIS — Z3A00 Weeks of gestation of pregnancy not specified: Secondary | ICD-10-CM | POA: Insufficient documentation

## 2018-11-01 DIAGNOSIS — O24414 Gestational diabetes mellitus in pregnancy, insulin controlled: Secondary | ICD-10-CM

## 2018-11-01 NOTE — Progress Notes (Signed)
BG's vary 50's-190's; usually high after supper and bedtime.  Instructed pt on use of NPH and R insulins and how to mix the doses. Pt practiced drawing up N/R insulin and giving injection. Gave pt insulin start kit. Reviewed causes, symptoms and treatment of low BG's-advised to carry glucose tablets or candy and snacks at all times along with medical alert ID. Pt has had Glyburide 5 mg this am-advised pt to take Glyburide 5 mg tonight before supper and then STOP it. On 11-02-18 start N 26 units + R10 units before Breakfast and N 10 units + R 10 units before supper. Call if questions or problems arise

## 2018-11-01 NOTE — Patient Instructions (Addendum)
  Check blood sugars 4 x day before each meal and 2 hr after each meal  every day and record  Eat 3 meals day and  3  snacks a day  Space meals and snacks 2-3 hours apart  Get a Games developer fast acting glucose and a snack at all times  Take Glyburide 5 mg tonight before supper and then STOP it   Take N 26 units and R 10 units before breakfast daily-start in AM 11-02-18  Take N 10 units and R 10 units before supper daily-start 11-02-18  Rotate injection sites  Carry medical alert ID at all times  Call if questions or problems arise

## 2018-12-02 DIAGNOSIS — Z23 Encounter for immunization: Secondary | ICD-10-CM | POA: Diagnosis not present

## 2018-12-02 DIAGNOSIS — O0992 Supervision of high risk pregnancy, unspecified, second trimester: Secondary | ICD-10-CM | POA: Diagnosis not present

## 2018-12-02 DIAGNOSIS — O24112 Pre-existing diabetes mellitus, type 2, in pregnancy, second trimester: Secondary | ICD-10-CM | POA: Diagnosis not present

## 2018-12-15 DIAGNOSIS — R3989 Other symptoms and signs involving the genitourinary system: Secondary | ICD-10-CM | POA: Diagnosis not present

## 2018-12-15 DIAGNOSIS — O24414 Gestational diabetes mellitus in pregnancy, insulin controlled: Secondary | ICD-10-CM | POA: Diagnosis not present

## 2018-12-15 DIAGNOSIS — O0993 Supervision of high risk pregnancy, unspecified, third trimester: Secondary | ICD-10-CM | POA: Diagnosis not present

## 2018-12-15 DIAGNOSIS — R319 Hematuria, unspecified: Secondary | ICD-10-CM | POA: Diagnosis not present

## 2018-12-20 ENCOUNTER — Observation Stay
Admission: EM | Admit: 2018-12-20 | Discharge: 2018-12-20 | Disposition: A | Discharge status: Home / Self Care | Payer: BC Managed Care – PPO | Attending: Obstetrics and Gynecology | Admitting: Obstetrics and Gynecology

## 2018-12-20 ENCOUNTER — Other Ambulatory Visit: Payer: Self-pay

## 2018-12-20 DIAGNOSIS — O99210 Obesity complicating pregnancy, unspecified trimester: Secondary | ICD-10-CM | POA: Insufficient documentation

## 2018-12-20 DIAGNOSIS — Z794 Long term (current) use of insulin: Secondary | ICD-10-CM | POA: Diagnosis not present

## 2018-12-20 DIAGNOSIS — Z9049 Acquired absence of other specified parts of digestive tract: Secondary | ICD-10-CM | POA: Insufficient documentation

## 2018-12-20 DIAGNOSIS — Z79899 Other long term (current) drug therapy: Secondary | ICD-10-CM | POA: Insufficient documentation

## 2018-12-20 DIAGNOSIS — B373 Candidiasis of vulva and vagina: Secondary | ICD-10-CM | POA: Diagnosis not present

## 2018-12-20 DIAGNOSIS — O4703 False labor before 37 completed weeks of gestation, third trimester: Secondary | ICD-10-CM | POA: Diagnosis present

## 2018-12-20 DIAGNOSIS — Z3A30 30 weeks gestation of pregnancy: Secondary | ICD-10-CM | POA: Diagnosis not present

## 2018-12-20 DIAGNOSIS — O09513 Supervision of elderly primigravida, third trimester: Secondary | ICD-10-CM | POA: Insufficient documentation

## 2018-12-20 DIAGNOSIS — R1030 Lower abdominal pain, unspecified: Secondary | ICD-10-CM | POA: Diagnosis not present

## 2018-12-20 DIAGNOSIS — E669 Obesity, unspecified: Secondary | ICD-10-CM | POA: Insufficient documentation

## 2018-12-20 DIAGNOSIS — O9989 Other specified diseases and conditions complicating pregnancy, childbirth and the puerperium: Secondary | ICD-10-CM | POA: Diagnosis not present

## 2018-12-20 DIAGNOSIS — O24414 Gestational diabetes mellitus in pregnancy, insulin controlled: Secondary | ICD-10-CM | POA: Insufficient documentation

## 2018-12-20 DIAGNOSIS — O26893 Other specified pregnancy related conditions, third trimester: Secondary | ICD-10-CM | POA: Diagnosis not present

## 2018-12-20 DIAGNOSIS — R0602 Shortness of breath: Secondary | ICD-10-CM | POA: Diagnosis not present

## 2018-12-20 LAB — URINALYSIS, COMPLETE (UACMP) WITH MICROSCOPIC
Bilirubin Urine: NEGATIVE
Glucose, UA: 150 mg/dL — AB
Hgb urine dipstick: NEGATIVE
Ketones, ur: NEGATIVE mg/dL
Leukocytes,Ua: NEGATIVE
Nitrite: NEGATIVE
Protein, ur: NEGATIVE mg/dL
Specific Gravity, Urine: 1.014 (ref 1.005–1.030)
pH: 7 (ref 5.0–8.0)

## 2018-12-20 LAB — CHLAMYDIA/NGC RT PCR (ARMC ONLY)
Chlamydia Tr: NOT DETECTED
N gonorrhoeae: NOT DETECTED

## 2018-12-20 LAB — WET PREP, GENITAL
Clue Cells Wet Prep HPF POC: NONE SEEN
Sperm: NONE SEEN
Trich, Wet Prep: NONE SEEN

## 2018-12-20 LAB — FETAL FIBRONECTIN: Fetal Fibronectin: NEGATIVE

## 2018-12-20 MED ORDER — ACETAMINOPHEN 325 MG PO TABS: 650.0000 mg | ORAL_TABLET | ORAL | Status: DC | PRN

## 2018-12-20 MED ORDER — TERCONAZOLE 0.4 % VA CREA: 1.0000 | TOPICAL_CREAM | Freq: Every day | VAGINAL | 0 refills | Status: DC

## 2018-12-20 NOTE — OB Triage Note (Signed)
Pt presents c/o a "constant dull ache" in lower abdomen and SOB. Not tender to palpation. Yesterday the patient states she was having ctx that were taking her breath away and occasional tightening today. Denies any bleeding or LOF. Reports positive fetal movement. Denies recent intercourse. Vitals WNL. (diastolic BP on the lower side, per patient that is normal for her). Will continue to monitor.

## 2018-12-20 NOTE — Discharge Summary (Signed)
Amber Foster is a 38 y.o. female. She is at [redacted]w[redacted]d gestation. Patient's last menstrual period was 04/11/2018. Estimated Date of Delivery: 02/24/19  Prenatal care site: [facility]   Current pregnancy complicated by:  1. Gestational DM- dx 1st trimester; insulin at 22wks 2. Obesity, pre-preg 30.8 3. Advanced maternal age  Chief complaint: "constant dull ache" in lower abdomen and SOB  Location: lower abdomen Onset/timing: started yesterday, UCs about q30-58min, Duration: intermittent Quality: tightening and dull ache Severity: currently no pain Aggravating or alleviating conditions:  Associated signs/symptoms: denies LOF, bleeding. Having + FM, feels increased SOB when she has BH contractions Context: hx preterm contractions and premature dilation to 2cm with one of her sons, but term delivery.   S: Resting comfortably.  no VB.no LOF,  Active fetal movement.  Denies: HA, visual changes, SOB, or RUQ/epigastric pain  Maternal Medical History:   Past Medical History:  Diagnosis Date  . Back pain   . Congenital anomaly of spinal meninges ([place])   . Diabetes mellitus without complication ([place])   . Gestational diabetes   . Headache(784.0)   . Polycystic ovarian syndrome 10/2014  . UTI (lower urinary tract infection)   . Yeast infection     Past Surgical History:  Procedure Laterality Date  . CHOLECYSTECTOMY    . HYSTEROSCOPY N/A 07/25/2015   Procedure: HYSTEROSCOPY;  Surgeon: Donnamae Jude, MD;  Location: [facility];  Service: Gynecology;  Laterality: N/A;  . LAPAROSCOPY N/A 07/25/2015   Procedure: LAPAROSCOPY DIAGNOSTIC, POSTERIOR CUL DE [place] BIOPSY;  Surgeon: Donnamae Jude, MD;  Location: [facility];  Service: Gynecology;  Laterality: N/A;  . TONSILLECTOMY    . WISDOM TOOTH EXTRACTION      Allergies  Allergen Reactions  . Other Other (See Comments)    Pt prefers no liquid medications, causes shaking    Prior to Admission medications   Medication Sig Start Date End Date  Taking? Authorizing Provider  acetaminophen (TYLENOL) 500 MG tablet Take 500 mg by mouth every 6 (six) hours as needed.   Yes [provider]  insulin NPH Human (NOVOLIN N) 100 UNIT/ML injection Inject 28 Units into the skin every morning. Take 28 units in the morning  and 14 units before supper 10/27/18  Yes [provider]  insulin regular (NOVOLIN R) 100 units/mL injection Inject 12 Units into the skin 3 (three) times daily before meals. Take 12 units before breakfast, lunch, and supper 10/27/18  Yes [provider]  nitrofurantoin, macrocrystal-monohydrate, (MACROBID) 100 MG capsule Take 100 mg by mouth 2 (two) times daily. 100mg  twice a day for 7 days, started Friday 12/16/18   Yes [provider]  Prenatal 28-0.8 MG TABS Take 1 tablet by mouth daily.   Yes [provider]  doxylamine, Sleep, (UNISOM) 25 MG tablet Take 1 tablet by mouth at bedtime. 08/12/18 09/11/18  [provider]  glyBURIDE (DIABETA) 5 MG tablet Take 1 tablet by mouth 2 (two) times daily. 10/08/18   [provider]  ondansetron (ZOFRAN-ODT) 8 MG disintegrating tablet Take 1 tablet by mouth every 8 (eight) hours as needed. 08/12/18   [provider]  terconazole (TERAZOL 7) 0.4 % vaginal cream Place 1 applicator vaginally at bedtime. 12/20/18   McVey, Murray Hodgkins, CNM      Social History: She  reports that she has never smoked. She has never used smokeless tobacco. She reports that she does not drink alcohol or use drugs.  Family History: family history includes Bipolar disorder  in her sister; Cancer in her paternal grandfather; Hypertension in her mother; Seizures in her sister.   Review of Systems: A full review of systems was performed and negative except as noted in the HPI.     O:  BP (!) 112/49 (BP Location: Left Arm)   Pulse 76   Temp 97.8 F (36.6 C) (Oral)   Resp 18   Ht 5\' 4"  (1.626 m)   Wt 80.3 kg   LMP 04/11/2018   SpO2 98%   BMI 30.38 kg/m   Results for orders placed or performed during the hospital encounter of 12/20/18 (from the past 48 hour(s))  Wet prep, genital   Collection Time: 12/20/18 12:54 PM  Result Value Ref Range   Yeast Wet Prep HPF POC PRESENT (A) NONE SEEN   Trich, Wet Prep NONE SEEN NONE SEEN   Clue Cells Wet Prep HPF POC NONE SEEN NONE SEEN   WBC, Wet Prep HPF POC FEW (A) NONE SEEN   Sperm NONE SEEN   Chlamydia/NGC rt PCR ([facility] only)   Collection Time: 12/20/18 12:54 PM   Specimen: Genital  Result Value Ref Range   Specimen source GC/Chlam ENDOCERVICAL    Chlamydia Tr NOT DETECTED NOT DETECTED   N gonorrhoeae NOT DETECTED NOT DETECTED  Fetal fibronectin   Collection Time: 12/20/18 12:54 PM  Result Value Ref Range   Fetal Fibronectin NEGATIVE NEGATIVE   Appearance, FETFIB CLEAR CLEAR  Urinalysis, Complete w Microscopic   Collection Time: 12/20/18 12:54 PM  Result Value Ref Range   Color, Urine YELLOW (A) YELLOW   APPearance CLOUDY (A) CLEAR   Specific Gravity, Urine 1.014 1.005 - 1.030   pH 7.0 5.0 - 8.0   Glucose, UA 150 (A) NEGATIVE mg/dL   Hgb urine dipstick NEGATIVE NEGATIVE   Bilirubin Urine NEGATIVE NEGATIVE   Ketones, ur NEGATIVE NEGATIVE mg/dL   Protein, ur NEGATIVE NEGATIVE mg/dL   Nitrite NEGATIVE NEGATIVE   Leukocytes,Ua NEGATIVE NEGATIVE   RBC / HPF 6-10 0 - 5 RBC/hpf   WBC, UA 0-5 0 - 5 WBC/hpf   Bacteria, UA MANY (A) NONE SEEN   Squamous Epithelial / LPF 11-20 0 - 5   Mucus PRESENT      Constitutional: NAD, AAOx3  HE/ENT: extraocular movements grossly intact, moist mucous membranes CV: RRR PULM: nl respiratory effort, CTABL     Abd: gravid, non-tender, non-distended, soft      Ext: Non-tender, Nonedematous   Psych: mood appropriate, speech normal Pelvic: SSE done  Pelvic exam:mod amt white creamy dc noted, slight erythema. Cervix appears long and closed. Digital exam performed, 1cm external os, closed internal os.   Fetal  monitoring: Cat I Appropriate for GA; Reactive  NST Baseline: 140bpm Variability: moderate Accelerations:  present x >2 Decelerations absent  Toco: occasional UCs, noted x 2   A/P: 38 y.o. 3332w4d here for antenatal surveillance for preterm UCs  Principle Diagnosis:  Preterm UCs, 3rd trimester   Preterm labor: not present, Negative FFN   Fetal Wellbeing: Reassuring Cat 1 tracing with Reactive NST   Wet prep + yeast, rx terazol to phamacy  Negative GC/Ct and urine.  D/c home stable, precautions reviewed, follow-up as scheduled.    Randa NgoRebecca A McVey, CNM 12/20/2018  7:00 PM

## 2018-12-29 DIAGNOSIS — O0993 Supervision of high risk pregnancy, unspecified, third trimester: Secondary | ICD-10-CM | POA: Diagnosis not present

## 2018-12-29 DIAGNOSIS — O24414 Gestational diabetes mellitus in pregnancy, insulin controlled: Secondary | ICD-10-CM | POA: Diagnosis not present

## 2019-01-02 DIAGNOSIS — O0993 Supervision of high risk pregnancy, unspecified, third trimester: Secondary | ICD-10-CM | POA: Diagnosis not present

## 2019-01-05 DIAGNOSIS — O24414 Gestational diabetes mellitus in pregnancy, insulin controlled: Secondary | ICD-10-CM | POA: Diagnosis not present

## 2019-01-05 DIAGNOSIS — O0992 Supervision of high risk pregnancy, unspecified, second trimester: Secondary | ICD-10-CM | POA: Diagnosis not present

## 2019-01-09 DIAGNOSIS — O0993 Supervision of high risk pregnancy, unspecified, third trimester: Secondary | ICD-10-CM | POA: Diagnosis not present

## 2019-01-09 DIAGNOSIS — O24414 Gestational diabetes mellitus in pregnancy, insulin controlled: Secondary | ICD-10-CM | POA: Diagnosis not present

## 2019-01-09 DIAGNOSIS — O0992 Supervision of high risk pregnancy, unspecified, second trimester: Secondary | ICD-10-CM | POA: Diagnosis not present

## 2019-01-12 DIAGNOSIS — O0993 Supervision of high risk pregnancy, unspecified, third trimester: Secondary | ICD-10-CM | POA: Diagnosis not present

## 2019-01-12 DIAGNOSIS — O24414 Gestational diabetes mellitus in pregnancy, insulin controlled: Secondary | ICD-10-CM | POA: Diagnosis not present

## 2019-01-16 DIAGNOSIS — O0993 Supervision of high risk pregnancy, unspecified, third trimester: Secondary | ICD-10-CM | POA: Diagnosis not present

## 2019-01-16 DIAGNOSIS — O0992 Supervision of high risk pregnancy, unspecified, second trimester: Secondary | ICD-10-CM | POA: Diagnosis not present

## 2019-01-16 DIAGNOSIS — O24414 Gestational diabetes mellitus in pregnancy, insulin controlled: Secondary | ICD-10-CM | POA: Diagnosis not present

## 2019-01-19 DIAGNOSIS — O24414 Gestational diabetes mellitus in pregnancy, insulin controlled: Secondary | ICD-10-CM | POA: Diagnosis not present

## 2019-01-19 DIAGNOSIS — O0993 Supervision of high risk pregnancy, unspecified, third trimester: Secondary | ICD-10-CM | POA: Diagnosis not present

## 2019-01-23 DIAGNOSIS — O321XX0 Maternal care for breech presentation, not applicable or unspecified: Secondary | ICD-10-CM | POA: Diagnosis not present

## 2019-01-23 DIAGNOSIS — O24414 Gestational diabetes mellitus in pregnancy, insulin controlled: Secondary | ICD-10-CM | POA: Diagnosis not present

## 2019-01-23 DIAGNOSIS — O0992 Supervision of high risk pregnancy, unspecified, second trimester: Secondary | ICD-10-CM | POA: Diagnosis not present

## 2019-01-23 DIAGNOSIS — O0993 Supervision of high risk pregnancy, unspecified, third trimester: Secondary | ICD-10-CM | POA: Diagnosis not present

## 2019-01-26 DIAGNOSIS — O0993 Supervision of high risk pregnancy, unspecified, third trimester: Secondary | ICD-10-CM | POA: Diagnosis not present

## 2019-01-26 DIAGNOSIS — O24414 Gestational diabetes mellitus in pregnancy, insulin controlled: Secondary | ICD-10-CM | POA: Diagnosis not present

## 2019-01-26 DIAGNOSIS — R102 Pelvic and perineal pain: Secondary | ICD-10-CM | POA: Diagnosis not present

## 2019-01-28 LAB — OB RESULTS CONSOLE GBS: GBS: NEGATIVE

## 2019-01-30 DIAGNOSIS — O0993 Supervision of high risk pregnancy, unspecified, third trimester: Secondary | ICD-10-CM | POA: Diagnosis not present

## 2019-02-02 DIAGNOSIS — O24414 Gestational diabetes mellitus in pregnancy, insulin controlled: Secondary | ICD-10-CM | POA: Diagnosis not present

## 2019-02-02 DIAGNOSIS — O0993 Supervision of high risk pregnancy, unspecified, third trimester: Secondary | ICD-10-CM | POA: Diagnosis not present

## 2019-02-02 DIAGNOSIS — O24313 Unspecified pre-existing diabetes mellitus in pregnancy, third trimester: Secondary | ICD-10-CM | POA: Diagnosis not present

## 2019-02-03 ENCOUNTER — Inpatient Hospital Stay
Admission: EM | Admit: 2019-02-03 | Discharge: 2019-02-05 | DRG: 806 | Disposition: A | Discharge status: Home / Self Care | Payer: BC Managed Care – PPO | Attending: Obstetrics and Gynecology | Admitting: Obstetrics and Gynecology

## 2019-02-03 ENCOUNTER — Other Ambulatory Visit: Payer: Self-pay

## 2019-02-03 DIAGNOSIS — Z20828 Contact with and (suspected) exposure to other viral communicable diseases: Secondary | ICD-10-CM | POA: Diagnosis present

## 2019-02-03 DIAGNOSIS — O24424 Gestational diabetes mellitus in childbirth, insulin controlled: Secondary | ICD-10-CM | POA: Diagnosis not present

## 2019-02-03 DIAGNOSIS — R6883 Chills (without fever): Secondary | ICD-10-CM | POA: Diagnosis not present

## 2019-02-03 DIAGNOSIS — O9089 Other complications of the puerperium, not elsewhere classified: Secondary | ICD-10-CM | POA: Diagnosis not present

## 2019-02-03 DIAGNOSIS — N719 Inflammatory disease of uterus, unspecified: Secondary | ICD-10-CM | POA: Diagnosis not present

## 2019-02-03 DIAGNOSIS — O321XX0 Maternal care for breech presentation, not applicable or unspecified: Secondary | ICD-10-CM | POA: Diagnosis not present

## 2019-02-03 DIAGNOSIS — E669 Obesity, unspecified: Secondary | ICD-10-CM | POA: Diagnosis not present

## 2019-02-03 DIAGNOSIS — A419 Sepsis, unspecified organism: Secondary | ICD-10-CM | POA: Diagnosis not present

## 2019-02-03 DIAGNOSIS — Z3A37 37 weeks gestation of pregnancy: Secondary | ICD-10-CM

## 2019-02-03 DIAGNOSIS — O99214 Obesity complicating childbirth: Secondary | ICD-10-CM | POA: Diagnosis present

## 2019-02-03 DIAGNOSIS — O864 Pyrexia of unknown origin following delivery: Secondary | ICD-10-CM | POA: Diagnosis not present

## 2019-02-03 DIAGNOSIS — R740 Nonspecific elevation of levels of transaminase and lactic acid dehydrogenase [LDH]: Secondary | ICD-10-CM | POA: Diagnosis not present

## 2019-02-03 DIAGNOSIS — R509 Fever, unspecified: Secondary | ICD-10-CM | POA: Diagnosis not present

## 2019-02-03 DIAGNOSIS — R079 Chest pain, unspecified: Secondary | ICD-10-CM | POA: Diagnosis not present

## 2019-02-03 LAB — GLUCOSE, CAPILLARY
Glucose-Capillary: 82 mg/dL (ref 70–99)
Glucose-Capillary: 71 mg/dL (ref 70–99)
Glucose-Capillary: 83 mg/dL (ref 70–99)

## 2019-02-03 LAB — CBC
HCT: 33.6 % — ABNORMAL LOW (ref 36.0–46.0)
Hemoglobin: 11.2 g/dL — ABNORMAL LOW (ref 12.0–15.0)
MCH: 29 pg (ref 26.0–34.0)
MCHC: 33.3 g/dL (ref 30.0–36.0)
MCV: 87 fL (ref 80.0–100.0)
Platelets: 179 10*3/uL (ref 150–400)
RBC: 3.86 MIL/uL — ABNORMAL LOW (ref 3.87–5.11)
RDW: 13.8 % (ref 11.5–15.5)
WBC: 7.3 10*3/uL (ref 4.0–10.5)
nRBC: 0 % (ref 0.0–0.2)

## 2019-02-03 LAB — TYPE AND SCREEN
ABO/RH(D): A POS
Antibody Screen: NEGATIVE

## 2019-02-03 LAB — SARS CORONAVIRUS 2 BY RT PCR (HOSPITAL ORDER, PERFORMED IN ~~LOC~~ HOSPITAL LAB): SARS Coronavirus 2: NEGATIVE

## 2019-02-03 MED ORDER — AMMONIA AROMATIC IN INHA
RESPIRATORY_TRACT | Status: AC
  Filled 2019-02-03: qty 10

## 2019-02-03 MED ORDER — MISOPROSTOL 200 MCG PO TABS
ORAL_TABLET | ORAL | Status: AC
  Filled 2019-02-03: qty 4

## 2019-02-03 MED ORDER — LIDOCAINE HCL (PF) 1 % IJ SOLN
INTRAMUSCULAR | Status: AC
  Filled 2019-02-03: qty 30

## 2019-02-03 MED ORDER — TERBUTALINE SULFATE 1 MG/ML IJ SOLN: 0.2500 mg | Freq: Once | INTRAMUSCULAR | Status: DC | PRN

## 2019-02-03 MED ORDER — LACTATED RINGERS IV SOLN
INTRAVENOUS | Status: DC
  Administered 2019-02-03 – 2019-02-04 (×3): via INTRAVENOUS

## 2019-02-03 MED ORDER — SOD CITRATE-CITRIC ACID 500-334 MG/5ML PO SOLN: 30.0000 mL | ORAL | Status: DC | PRN

## 2019-02-03 MED ORDER — LACTATED RINGERS IV SOLN: 500.0000 mL | INTRAVENOUS | Status: DC | PRN

## 2019-02-03 MED ORDER — BUTORPHANOL TARTRATE 1 MG/ML IJ SOLN
1.0000 mg | INTRAMUSCULAR | Status: DC | PRN
  Administered 2019-02-04 (×4): 1 mg via INTRAVENOUS
  Filled 2019-02-03 (×5): qty 1

## 2019-02-03 MED ORDER — OXYTOCIN 10 UNIT/ML IJ SOLN
INTRAMUSCULAR | Status: AC
  Filled 2019-02-03: qty 2

## 2019-02-03 MED ORDER — OXYTOCIN BOLUS FROM INFUSION
500.0000 mL | Freq: Once | INTRAVENOUS | Status: AC
  Administered 2019-02-04: 500 mL via INTRAVENOUS

## 2019-02-03 MED ORDER — OXYTOCIN 40 UNITS IN NORMAL SALINE INFUSION - SIMPLE MED
1.0000 m[IU]/min | INTRAVENOUS | Status: DC
  Administered 2019-02-03: 2 m[IU]/min via INTRAVENOUS
  Filled 2019-02-03 (×2): qty 1000

## 2019-02-03 MED ORDER — ACETAMINOPHEN 500 MG PO TABS: 1000.0000 mg | ORAL_TABLET | Freq: Four times a day (QID) | ORAL | Status: DC | PRN

## 2019-02-03 MED ORDER — MISOPROSTOL 25 MCG QUARTER TABLET
25.0000 ug | ORAL_TABLET | ORAL | Status: DC
  Administered 2019-02-03: 25 ug via BUCCAL
  Filled 2019-02-03: qty 1

## 2019-02-03 MED ORDER — OXYTOCIN 40 UNITS IN NORMAL SALINE INFUSION - SIMPLE MED
2.5000 [IU]/h | INTRAVENOUS | Status: DC
  Administered 2019-02-04: 2.5 [IU]/h via INTRAVENOUS

## 2019-02-03 MED ORDER — MISOPROSTOL 25 MCG QUARTER TABLET
25.0000 ug | ORAL_TABLET | ORAL | Status: DC | PRN
  Administered 2019-02-03: 25 ug via VAGINAL
  Filled 2019-02-03: qty 1

## 2019-02-03 MED ORDER — ONDANSETRON HCL 4 MG/2ML IJ SOLN: 4.0000 mg | Freq: Four times a day (QID) | INTRAMUSCULAR | Status: DC | PRN

## 2019-02-03 MED ORDER — LIDOCAINE HCL (PF) 1 % IJ SOLN
30.0000 mL | INTRAMUSCULAR | Status: AC | PRN
  Administered 2019-02-04: 30 mL via SUBCUTANEOUS

## 2019-02-03 NOTE — Progress Notes (Signed)
Labor Progress Note  Amber Foster is a 38 y.o. [phone] at [redacted]w[redacted]d by ultrasound admitted for induction of labor due to Diabetes.  Subjective: feeling contractions, worse when she stands and walks but bearable.   Objective: BP (!) 111/59 (BP Location: Right Arm)   Pulse 64   Temp 98.5 F (36.9 C) (Oral)   Resp 19   Ht 5\' 4"  (1.626 m)   Wt 81.6 kg   LMP 04/11/2018   BMI 30.90 kg/m  Notable VS details: reviewed  Fetal Assessment: FHT:  FHR: 135 bpm, variability: moderate,  accelerations:  Present,  decelerations:  Absent Category/reactivity:  Category I UC:   regular, every 3 minutes, pitocin at 73mu/min SVE:   3/50/-1, soft/posterior. Head well applied to cervix, AROM performed for small amt clear fluid.   Membrane status: ruptured at 2112 Amniotic color: clear  Labs: Lab Results  Component Value Date   WBC 7.3 02/03/2019   HGB 11.2 (L) 02/03/2019   HCT 33.6 (L) 02/03/2019   MCV 87.0 02/03/2019   PLT 179 02/03/2019    Assessment / Plan: IOL at 37wks for GDMA2 on insulin.   Labor: Progressing normally, head well applied, AROM performed. WIll continue to titrate pitocin.  Preeclampsia:  no e/o Pre-e Fetal Wellbeing:  Category I, intermittent variables around 2030 with spontaneous resolution. Accels and moderate variability throughout.  Pain Control:  Labor support without medications I/D:  n/a Anticipated MOD:  NSVD  [name], CNM 02/03/2019, 9:18 PM

## 2019-02-03 NOTE — Procedures (Signed)
Patient is 37 weeks with Breech presentation. She was counseled of attempt for external cephalic version vs. Cesarean.  Risks of failure, abruption, rupture of membranes, and fetal distress were discussed, and patient agreed for ECV.   Preop: breech presentation, term IUP Postop: breech presentation, successful ECV, term IUP   Findings:   Breech fetal lie, with fetal head maternal upper left, spine maternal right, placenta maternal right Post-procedure: cephalic, normal cardiac rate and rhythm    Fetal heart rate documented in the normal baseline range throughout procedure. Bedside ultrasound confirmed cephalic at the end of the procedure.    After the procedure, external fetal monitors were placed and confirmed a normal baseline heart rate with moderate variability.     Due to co-morbidity of insulin-dependent gestational diabetes, she will remain for induction of labor.   ----- Larey Days, MD Attending Obstetrician and Gynecologist [facility], Department of [facility]

## 2019-02-03 NOTE — H&P (Signed)
OB History & Physical   History of Present Illness:  Chief Complaint: here for induction  HPI:  Amber Foster is a 38 y.o. [phone] female at 7076w0d dated by US at 7954w6d. Patient's last menstrual period was 04/11/2018.  She presents to L&D for induction of labor due to insulin dependent diabetes. Reports active FM, irregular painful UCs, denies LOF or VB.   ON arrival this am, was noted to be breech with head on maternal left, evaluated by Dr Elesa MassedWard, external cephalic version done, see procedure note for details.   Currently feeling irregular UCs, has felt significant fetal movement since version performed earlier.    Pregnancy Issues: 1. History GDM x2-->Pre-gestational diabetes initial A1C 6.6 this preg; glyburide then insulin. 2. Obesity, BMI 30.8 3. Unstable Lie, breech at 32wks.    Maternal Medical History:   Past Medical History:  Diagnosis Date  . Back pain   . Congenital anomaly of spinal meninges ([facility])   . Diabetes mellitus without complication ([facility])   . Gestational diabetes   . Headache(784.0)   . Polycystic ovarian syndrome 10/2014  . UTI (lower urinary tract infection)   . Yeast infection     Past Surgical History:  Procedure Laterality Date  . CHOLECYSTECTOMY    . HYSTEROSCOPY N/A 07/25/2015   Procedure: HYSTEROSCOPY;  Surgeon: Reva Boresanya S Pratt, MD;  Location: [facility];  Service: Gynecology;  Laterality: N/A;  . LAPAROSCOPY N/A 07/25/2015   Procedure: LAPAROSCOPY DIAGNOSTIC, POSTERIOR CUL DE SAC BIOPSY;  Surgeon: Reva Boresanya S Pratt, MD;  Location: [facility];  Service: Gynecology;  Laterality: N/A;  . TONSILLECTOMY    . WISDOM TOOTH EXTRACTION      Allergies  Allergen Reactions  . Other Other (See Comments)    Pt prefers no liquid medications, causes shaking    Prior to Admission medications   Medication Sig Start Date End Date Taking? Authorizing Provider  acetaminophen (TYLENOL) 500 MG tablet Take 500 mg by mouth every 6 (six) hours as needed.   Yes [provider]   insulin NPH Human (NOVOLIN N) 100 UNIT/ML injection Inject 28 Units into the skin every morning. Take 28 units in the morning  and 14 units before supper 10/27/18  Yes [provider]  insulin regular (NOVOLIN R) 100 units/mL injection Inject 12 Units into the skin 3 (three) times daily before meals. Take 12 units before breakfast, lunch, and supper 10/27/18  Yes [provider]  Prenatal 28-0.8 MG TABS Take 1 tablet by mouth daily.   Yes [provider]  doxylamine, Sleep, (UNISOM) 25 MG tablet Take 1 tablet by mouth at bedtime. 08/12/18 09/11/18  [provider]  glyBURIDE (DIABETA) 5 MG tablet Take 1 tablet by mouth 2 (two) times daily. 10/08/18   [provider]  nitrofurantoin, macrocrystal-monohydrate, (MACROBID) 100 MG capsule Take 100 mg by mouth 2 (two) times daily. 100mg  twice a day for 7 days, started Friday 12/16/18    [provider]  ondansetron (ZOFRAN-ODT) 8 MG disintegrating tablet Take 1 tablet by mouth every 8 (eight) hours as needed. 08/12/18   [provider]  terconazole (TERAZOL 7) 0.4 % vaginal cream Place 1 applicator vaginally at bedtime. Patient not taking: Reported on 02/03/2019 12/20/18   Mysha Peeler, Prudencio Pairebecca A, CNM     [facility]  Social History: She  reports that she has never smoked. She has never used smokeless tobacco. She reports that she does not drink alcohol or use drugs.  Family History: family  history includes Bipolar disorder in her sister; Cancer in her paternal grandfather; Hypertension in her mother; Seizures in her sister.   Review of Systems: A full review of systems was performed and negative except as noted in the HPI.     Physical Exam:  Vital Signs: BP (!) 112/57 (BP Location: Left Arm)   Pulse 67   Temp 98.4 F (36.9 C) (Oral)   Resp 16   Ht 5\' 4"  (1.626 m)   Wt 81.6 kg   LMP 04/11/2018   BMI 30.90 kg/m    General: no acute distress.  HEENT: normocephalic,  atraumatic Heart: regular rate & rhythm.  No murmurs/rubs/gallops Lungs: clear to auscultation bilaterally, normal respiratory effort Abdomen: soft, gravid, non-tender;  EFW: 8#0 on growth US done 8/27 Pelvic:  Scant bloody show, vertex confirmed via US- oblique presentation, cervix FTP at 1300 with placement of cytotec.   External: Normal external female genitalia    Extremities: non-tender, symmetric, trace edema bilaterally.  DTRs: 2+  Neurologic: Alert & oriented x 3.    Results for orders placed or performed during the hospital encounter of 02/03/19 (from the past 24 hour(s))  SARS Coronavirus 2 [facility] order, Performed in [place] hospital lab)     Status: None   Collection Time: 02/03/19  9:28 AM  Result Value Ref Range   SARS Coronavirus 2 NEGATIVE NEGATIVE  CBC     Status: Abnormal   Collection Time: 02/03/19 10:56 AM  Result Value Ref Range   WBC 7.3 4.0 - 10.5 K/uL   RBC 3.86 (L) 3.87 - 5.11 MIL/uL   Hemoglobin 11.2 (L) 12.0 - 15.0 g/dL   HCT 33.6 (L) 36.0 - 46.0 %   MCV 87.0 80.0 - 100.0 fL   MCH 29.0 26.0 - 34.0 pg   MCHC 33.3 30.0 - 36.0 g/dL   RDW 13.8 11.5 - 15.5 %   Platelets 179 150 - 400 K/uL   nRBC 0.0 0.0 - 0.2 %  Type and screen [place]     Status: None   Collection Time: 02/03/19 10:56 AM  Result Value Ref Range   ABO/RH(D) A POS    Antibody Screen NEG    Sample Expiration      02/06/2019,2359 Performed at [facility], [address] [place], [place] [address]   Glucose, capillary     Status: None   Collection Time: 02/03/19  1:27 PM  Result Value Ref Range   Glucose-Capillary 82 70 - 99 mg/dL  Glucose, capillary     Status: None   Collection Time: 02/03/19  5:33 PM  Result Value Ref Range   Glucose-Capillary 71 70 - 99 mg/dL    Pertinent Results:  Prenatal Labs: Blood type/Rh  A Pos  Antibody screen neg  Rubella Immune  Varicella Immune  RPR NR  HBsAg Neg  HIV NR  GC neg  Chlamydia neg   Genetic screening negative   early 1 hour GTT  231, A1C 6.6  GBS  negative   FHT: 135bpm, moderate variability, + accels, no decels TOCO: Irregular UCs   Cephalic by leopolds, confirmed with [place]  No results found.  Assessment:  Amber Foster is a 38 y.o. [phone] female at [redacted]w[redacted]d with insulin dependent diabetes.   Plan:  1. Admit to Labor & Delivery; consents reviewed and obtained - COVID negative - s/p ECV done by Dr Leonides Schanz - CBG q4hr then q1h in active labor, diabetes care co-managed with MD  2. Fetal Well  being  - Fetal Tracing: Cat I tracing - Group B Streptococcus ppx indicated: negative - Presentation: now cephalic confirmed by Leopolds and bedside US   3. Routine OB: - Prenatal labs reviewed, as above - Rh A Pos - CBC, T&S, RPR on admit - Clear fluids, IVF  4. Induction of Labor -  Contractions: external toco in place -  Pelvis proven to 8#3 -  Plan for induction with Cytotec buccal and vaginal placed now.  -  Plan for continuous fetal monitoring  -  Maternal pain control as desired - Anticipate vaginal delivery  5. Post Partum Planning: - Infant feeding: formula - Contraception: BTL if CS performed  Randa Ngo, CNM 02/03/19 1330

## 2019-02-04 LAB — RPR: RPR Ser Ql: NONREACTIVE

## 2019-02-04 LAB — GLUCOSE, CAPILLARY
Glucose-Capillary: 85 mg/dL (ref 70–99)
Glucose-Capillary: 86 mg/dL (ref 70–99)
Glucose-Capillary: 143 mg/dL — ABNORMAL HIGH (ref 70–99)
Glucose-Capillary: 155 mg/dL — ABNORMAL HIGH (ref 70–99)
Glucose-Capillary: 107 mg/dL — ABNORMAL HIGH (ref 70–99)
Glucose-Capillary: 104 mg/dL — ABNORMAL HIGH (ref 70–99)

## 2019-02-04 MED ORDER — SIMETHICONE 80 MG PO CHEW: 80.0000 mg | CHEWABLE_TABLET | ORAL | Status: DC | PRN

## 2019-02-04 MED ORDER — SENNOSIDES-DOCUSATE SODIUM 8.6-50 MG PO TABS
2.0000 | ORAL_TABLET | ORAL | Status: DC
  Administered 2019-02-04: 2 via ORAL
  Filled 2019-02-04: qty 2

## 2019-02-04 MED ORDER — IBUPROFEN 600 MG PO TABS
600.0000 mg | ORAL_TABLET | Freq: Four times a day (QID) | ORAL | Status: DC
  Administered 2019-02-04 – 2019-02-05 (×3): 600 mg via ORAL
  Filled 2019-02-04 (×4): qty 1

## 2019-02-04 MED ORDER — ACETAMINOPHEN 325 MG PO TABS
650.0000 mg | ORAL_TABLET | ORAL | Status: DC | PRN
  Administered 2019-02-04: 650 mg via ORAL

## 2019-02-04 MED ORDER — DIBUCAINE (PERIANAL) 1 % EX OINT: 1.0000 "application " | TOPICAL_OINTMENT | CUTANEOUS | Status: DC | PRN

## 2019-02-04 MED ORDER — COCONUT OIL OIL: 1.0000 "application " | TOPICAL_OIL | Status: DC | PRN

## 2019-02-04 MED ORDER — ZOLPIDEM TARTRATE 5 MG PO TABS: 5.0000 mg | ORAL_TABLET | Freq: Every evening | ORAL | Status: DC | PRN

## 2019-02-04 MED ORDER — DIPHENHYDRAMINE HCL 25 MG PO CAPS: 25.0000 mg | ORAL_CAPSULE | Freq: Four times a day (QID) | ORAL | Status: DC | PRN

## 2019-02-04 MED ORDER — WITCH HAZEL-GLYCERIN EX PADS: 1.0000 "application " | MEDICATED_PAD | CUTANEOUS | Status: DC | PRN

## 2019-02-04 MED ORDER — PRENATAL MULTIVITAMIN CH: 1.0000 | ORAL_TABLET | Freq: Every day | ORAL | Status: DC

## 2019-02-04 MED ORDER — BENZOCAINE-MENTHOL 20-0.5 % EX AERO: 1.0000 "application " | INHALATION_SPRAY | CUTANEOUS | Status: DC | PRN

## 2019-02-04 MED ORDER — ONDANSETRON HCL 4 MG/2ML IJ SOLN: 4.0000 mg | INTRAMUSCULAR | Status: DC | PRN

## 2019-02-04 MED ORDER — ONDANSETRON HCL 4 MG PO TABS: 4.0000 mg | ORAL_TABLET | ORAL | Status: DC | PRN

## 2019-02-04 NOTE — Discharge Summary (Signed)
Obstetrical Discharge Summary  Patient Name: Amber Foster DOB: 1980/12/04 MRN: [id number]  Date of Admission: 02/03/2019 Date of Delivery: 02/04/19 Delivered by: Hassan Buckler CNM Date of Discharge: 02/05/2019  Primary OB: [facility]  ALP:[id number] last menstrual period was 04/11/2018. EDC Estimated Date of Delivery: 02/24/19 Gestational Age at Delivery: [redacted]w[redacted]d   Antepartum complications:  1. History GDM x2-->Pre-gestational diabetes initial A1C 6.6 this preg; glyburide then insulin. 2. Obesity, BMI 30.8 3. Unstable Lie, breech at 32wks, ECV on admission   Admitting Diagnosis: GDMa2 on insulin, 37wks, breech presentation Secondary Diagnosis: successful ECV, SVD, 1st deg perineal lac  Patient Active Problem List   Diagnosis Date Noted  . Labor and delivery indication for care or intervention 02/03/2019  . Preterm uterine contractions in third trimester, antepartum 12/20/2018  . Abnormal uterine bleeding 08/14/2015  . Dyspareunia in female 08/14/2015  . PCOS (polycystic ovarian syndrome) 05/07/2015  . Adnexal pain 11/08/2014  . History of gestational diabetes 07/28/2013    Augmentation: AROM, Pitocin and Cytotec Complications: None Intrapartum complications/course: cytotec x 1 dose, then pitocin, AROM with fetal head well applied, position changes for OP presentation, and progressed from 6 to C/C/+3 and SVD with 1 push.  Date of Delivery: 02/04/19 Delivered By: Hassan Buckler CNM Delivery Type: spontaneous vaginal delivery Anesthesia: epidural Placenta: spontaneous Laceration: 1st deg perineal Episiotomy: none Newborn Data: Live born female "Roman Valarie Merino" Birth Weight:  6#10 APGAR: 31, 9  Newborn Delivery   Birth date/time: 02/04/2019 09:38:00 Delivery type: Vaginal, Spontaneous        Postpartum Procedures: none  Post partum course:  Patient had an uncomplicated postpartum course.  By time of discharge on PPD#1, her pain was controlled on oral pain  medications; she had appropriate lochia and was ambulating, voiding without difficulty and tolerating regular diet.  She was deemed stable for discharge to home.     Discharge Physical Exam:  BP 92/70 (BP Location: Right Arm)   Pulse 62   Temp 97.6 F (36.4 C) (Oral)   Resp 18   Ht 5\' 4"  (1.626 m)   Wt 81.6 kg   LMP 04/11/2018   SpO2 98%   Breastfeeding Unknown   BMI 30.90 kg/m   General: NAD CV: RRR Pulm: CTABL, nl effort ABD: s/nd/nt, fundus firm and below the umbilicus Lochia: moderate Fundus: FF, u/1 Perineum: well approximated DVT Evaluation: LE non-ttp, no evidence of DVT on exam.  Hemoglobin  Date Value Ref Range Status  02/05/2019 10.6 (L) 12.0 - 15.0 g/dL Final   HGB  Date Value Ref Range Status  09/04/2014 13.6 12.0 - 16.0 g/dL Final   HCT  Date Value Ref Range Status  02/05/2019 32.5 (L) 36.0 - 46.0 % Final  09/04/2014 40.7 35.0 - 47.0 % Final     Disposition: stable, discharge to home. Baby Feeding: formula Baby Disposition: home with mom  Rh Immune globulin given: n/a Rubella vaccine given: immune Varicella vaccine given: immune Tdap vaccine given in AP or PP setting: 12/02/18 Flu vaccine given in AP or PP setting: n/a  Contraception: undecided   Prenatal Labs:  Blood type/Rh  A Pos  Antibody screen neg  Rubella Immune  Varicella Immune  RPR NR  HBsAg Neg  HIV NR  GC neg  Chlamydia neg  Genetic screening negative   early 1 hour GTT  231, A1C 6.6  GBS  negative      Plan:  Amber Foster was discharged to home in good condition. Follow-up appointment with delivering provider in  6 weeks.  Discharge Medications: Allergies as of 02/05/2019      Reactions   Other Other (See Comments)   Pt prefers no liquid medications, causes shaking      Medication List    STOP taking these medications   doxylamine (Sleep) 25 MG tablet Commonly known as: UNISOM     TAKE these medications   acetaminophen 325 MG tablet Commonly known as:  Tylenol Take 2 tablets (650 mg total) by mouth every 4 (four) hours as needed (for pain scale < 4). What changed:   medication strength  how much to take  when to take this  reasons to take this   ibuprofen 600 MG tablet Commonly known as: ADVIL Take 1 tablet (600 mg total) by mouth every 6 (six) hours.   Prenatal 28-0.8 MG Tabs Take 1 tablet by mouth daily.   senna-docusate 8.6-50 MG tablet Commonly known as: Senokot-S Take 2 tablets by mouth daily. Start taking on: February 06, 2019       Follow-up Information    McVey, Prudencio PairRebecca A, CNM Follow up in 6 week(s).   Specialty: Obstetrics and Gynecology Why: routine PP visit Contact information: [address] [place] [address] [phone]           Signed:  Randa NgoRebecca A McVey, CNM 02/05/2019  10:08 AM

## 2019-02-04 NOTE — Progress Notes (Signed)
Labor Progress Note  Amber Foster is a 38 y.o. [phone] at [redacted]w[redacted]d by ultrasound admitted for induction of labor due to Diabetes.  Subjective: feeling more rectal pressure, requesting repeat IVPM dose.   Objective: BP (!) 98/49   Pulse 60   Temp 98.5 F (36.9 C) (Oral)   Resp 19   Ht 5\' 4"  (1.626 m)   Wt 81.6 kg   LMP 04/11/2018   BMI 30.90 kg/m  Notable VS details: reviewed  Fetal Assessment: FHT:  FHR: 135 bpm, variability: moderate,  accelerations:  Present,  decelerations:  Present early decels, some with late onset but all resolve before end of UC.  Category/reactivity:  Category I UC:   regular, every 3-4 minutes, Pitocin at 35mu/min SVE:   6/80/-1, soft/posterior. Head well applied to cervix, small amt bloody show, cervix very stretchy.  Membrane status: ruptured at 2112 Amniotic color: clear  Labs: Lab Results  Component Value Date   WBC 7.3 02/03/2019   HGB 11.2 (L) 02/03/2019   HCT 33.6 (L) 02/03/2019   MCV 87.0 02/03/2019   PLT 179 02/03/2019    Assessment / Plan: IOL at 37+1 wks for GDMA2 on insulin.   Labor: Progressing normally, head well applied. Continue pitocin titration, encouraged to change positions with extreme lateral and use of peanut ball for presumed OP position of fetus.  Preeclampsia:  no e/o Pre-e Fetal Wellbeing:  Category I, early decels continue,  Accels and moderate variability throughout.  Pain Control:  IV pain meds - pt does not want epidural.  I/D:  n/a Anticipated MOD:  NSVD  Murray Hodgkins [place], CNM 02/04/2019, 9:02 AM

## 2019-02-04 NOTE — Plan of Care (Signed)

## 2019-02-04 NOTE — Progress Notes (Signed)
Labor Progress Note  Amber Foster is a 38 y.o. [phone] at [redacted]w[redacted]d by ultrasound admitted for induction of labor due to Diabetes.  Subjective: resting intermittently after 1 dose of stadol. CNM called by RN for reviewed of FHR tracing.  Objective: BP (!) 96/51 (BP Location: Right Arm)   Pulse 63   Temp 98.4 F (36.9 C) (Oral)   Resp 19   Ht 5\' 4"  (1.626 m)   Wt 81.6 kg   LMP 04/11/2018   BMI 30.90 kg/m  Notable VS details: reviewed  Fetal Assessment: FHT:  FHR: 120 bpm, variability: moderate,  accelerations:  Present,  decelerations:  Present intermittent early and late decels noted.  Category/reactivity:  Category II UC:   regular, every 3-4 minutes, pitocin DC'd by nursing SVE:   4/50/-1, soft/posterior. Head well applied to cervix, small amt bloody show.  Membrane status: ruptured at 2112 Amniotic color: clear  Labs: Lab Results  Component Value Date   WBC 7.3 02/03/2019   HGB 11.2 (L) 02/03/2019   HCT 33.6 (L) 02/03/2019   MCV 87.0 02/03/2019   PLT 179 02/03/2019    Assessment / Plan: IOL at 37+1 wks for GDMA2 on insulin.   Labor: Progressing normally, head well applied. WIll restart pitocin, encouraged to change positions with extreme lateral and use of peanut ball for presumed OP position of fetus.  Preeclampsia:  no e/o Pre-e Fetal Wellbeing:  Category I, intermittent early and late decels with spontaneous resolution. Accels and moderate variability throughout.  Pain Control:  IV pain meds I/D:  n/a Anticipated MOD:  NSVD  Murray Hodgkins McVey, CNM 02/04/2019, 3:47 AM

## 2019-02-05 LAB — GLUCOSE, CAPILLARY: Glucose-Capillary: 108 mg/dL — ABNORMAL HIGH (ref 70–99)

## 2019-02-05 LAB — CBC
HCT: 32.5 % — ABNORMAL LOW (ref 36.0–46.0)
Hemoglobin: 10.6 g/dL — ABNORMAL LOW (ref 12.0–15.0)
MCH: 29 pg (ref 26.0–34.0)
MCHC: 32.6 g/dL (ref 30.0–36.0)
MCV: 88.8 fL (ref 80.0–100.0)
Platelets: 170 10*3/uL (ref 150–400)
RBC: 3.66 MIL/uL — ABNORMAL LOW (ref 3.87–5.11)
RDW: 13.9 % (ref 11.5–15.5)
WBC: 10.5 10*3/uL (ref 4.0–10.5)
nRBC: 0 % (ref 0.0–0.2)

## 2019-02-05 MED ORDER — ACETAMINOPHEN 325 MG PO TABS: 650.0000 mg | ORAL_TABLET | ORAL | Status: DC | PRN

## 2019-02-05 MED ORDER — SENNOSIDES-DOCUSATE SODIUM 8.6-50 MG PO TABS: 2.0000 | ORAL_TABLET | ORAL | Status: DC

## 2019-02-05 MED ORDER — IBUPROFEN 600 MG PO TABS: 600.0000 mg | ORAL_TABLET | Freq: Four times a day (QID) | ORAL | 0 refills | Status: DC

## 2019-02-05 NOTE — Plan of Care (Signed)
Pt progressing well with current plan of care.

## 2019-02-05 NOTE — Progress Notes (Signed)
Reviewed D/C instructions with pt and family. Pt verbalized understanding of teaching. Discharged to home via W/C. Pt to schedule f/u appt.  

## 2019-02-05 NOTE — Progress Notes (Signed)
Post Partum Day 1 Subjective: Doing well, no complaints.  Tolerating regular diet, pain with PO meds, voiding and ambulating without difficulty. Blood sugars have normalized since delivery and remain 104-108.   No CP SOB Fever,Chills, N/V or leg pain; denies nipple or breast pain; no HA change of vision, RUQ/epigastric pain  Objective: BP 92/70 (BP Location: Right Arm)   Pulse 62   Temp 97.6 F (36.4 C) (Oral)   Resp 18   Ht 5\' 4"  (1.626 m)   Wt 81.6 kg   LMP 04/11/2018   SpO2 98%   Breastfeeding Unknown   BMI 30.90 kg/m    Physical Exam:  General: NAD Breasts: soft/nontender CV: RRR Pulm: nl effort, CTABL Abdomen: soft, NT, BS x 4 Perineum: minimal edema, laceration repair well approximated Lochia: small Uterine Fundus: fundus firm and 1 fb below umbilicus DVT Evaluation: no cords, ttp LEs   Recent Labs    02/03/19 1056 02/05/19 0631  HGB 11.2* 10.6*  HCT 33.6* 32.5*  WBC 7.3 10.5  PLT 179 170    Assessment/Plan: 38 y.o. [id number] postpartum day # 1  - Continue routine PP care - Normalized CBG, will dc checks per d/w Dr schermerhorn yesterday. Pt needs postpartum 75gram glucose challenge - encouraged snug fitting bra and cabbage leaves for bottlefeeding.  - Discussed contraceptive options including implant, IUDs hormonal and non-hormonal, injection, pills/ring/patch, condoms, and NFP. Undecided at this time.  - Immunization status: all Imms up to date    Disposition: Does desire Dc home today.     [name], CNM 02/05/2019  10:06 AM

## 2019-02-06 ENCOUNTER — Inpatient Hospital Stay
Admission: EM | Admit: 2019-02-06 | Discharge: 2019-02-10 | Disposition: A | Discharge status: Home / Self Care | Payer: BC Managed Care – PPO | Source: Home / Self Care | Attending: Obstetrics and Gynecology | Admitting: Obstetrics and Gynecology

## 2019-02-06 ENCOUNTER — Emergency Department: Payer: BC Managed Care – PPO

## 2019-02-06 ENCOUNTER — Other Ambulatory Visit: Payer: Self-pay

## 2019-02-06 DIAGNOSIS — O864 Pyrexia of unknown origin following delivery: Secondary | ICD-10-CM | POA: Diagnosis present

## 2019-02-06 DIAGNOSIS — R509 Fever, unspecified: Secondary | ICD-10-CM

## 2019-02-06 DIAGNOSIS — R079 Chest pain, unspecified: Secondary | ICD-10-CM

## 2019-02-06 DIAGNOSIS — A419 Sepsis, unspecified organism: Secondary | ICD-10-CM

## 2019-02-06 DIAGNOSIS — N719 Inflammatory disease of uterus, unspecified: Secondary | ICD-10-CM

## 2019-02-06 LAB — URINALYSIS, ROUTINE W REFLEX MICROSCOPIC
Bacteria, UA: NONE SEEN
Bilirubin Urine: NEGATIVE
Glucose, UA: NEGATIVE mg/dL
Ketones, ur: NEGATIVE mg/dL
Nitrite: NEGATIVE
Protein, ur: NEGATIVE mg/dL
RBC / HPF: >50 RBC/hpf — ABNORMAL HIGH (ref 0–5)
Specific Gravity, Urine: 1.012 (ref 1.005–1.030)
pH: 6 (ref 5.0–8.0)

## 2019-02-06 MED ORDER — GENTAMICIN SULFATE 40 MG/ML IJ SOLN
120.0000 mg | Freq: Once | INTRAVENOUS | Status: AC
  Administered 2019-02-07: 120 mg via INTRAVENOUS
  Filled 2019-02-06: qty 3

## 2019-02-06 MED ORDER — SODIUM CHLORIDE 0.9 % IV BOLUS
1000.0000 mL | Freq: Once | INTRAVENOUS | Status: AC
  Administered 2019-02-07: 01:00:00 1000 mL via INTRAVENOUS

## 2019-02-06 MED ORDER — SODIUM CHLORIDE 0.9 % IV BOLUS
1000.0000 mL | Freq: Once | INTRAVENOUS | Status: AC
  Administered 2019-02-07: 1000 mL via INTRAVENOUS

## 2019-02-06 MED ORDER — CLINDAMYCIN PHOSPHATE 900 MG/50ML IV SOLN
900.0000 mg | Freq: Once | INTRAVENOUS | Status: AC
  Administered 2019-02-07: 900 mg via INTRAVENOUS
  Filled 2019-02-06: qty 50

## 2019-02-06 NOTE — ED Notes (Signed)
Pt presents with a fever after vaginal delivery on 8/29

## 2019-02-06 NOTE — ED Triage Notes (Addendum)
Pt comes via POV from home with c/o fever, headache and chills. Pt states she just delivered on Saturday at 37 weeks vaginally.  Pt also states they did an inversion to flip baby and was induced. Pt also states gestational diabetes, high fluids in belly during pregnancy.  Pt denies any swelling to extremities. Pt states this started about 8pm tonight.   Pt denies any CP, SOB or blurred vision. Pt does state some weakness. Pt is shivering but diaphoretic.

## 2019-02-06 NOTE — ED Provider Notes (Signed)
[facility] Emergency Department Provider Note  ____________________________________________   First MD Initiated Contact with Patient 02/06/19 2251     (approximate)  I have reviewed the triage vital signs and the nursing notes.   HISTORY  Chief Complaint Fever and Headache    HPI Amber Foster is a 38 y.o. female with diabetes who is presenting with fever headaches and chills.  Patient status post a vaginal delivery at 37 weeks on Saturday, 2 days ago.  Patient says her symptoms are about 8 PM.  Symptoms were sudden in onset, the fevers are severe, constant, not better with Tylenol that she took at 930, nothing makes them worse.  She has associated headache as well as leaking of fluids out of her vagina that started today, breast engorgement, and mild abdominal cramping.  Denies any shortness of breath.          Past Medical History:  Diagnosis Date   Back pain    Congenital anomaly of spinal meninges (HCC)    Diabetes mellitus without complication (HCC)    Gestational diabetes    Headache(784.0)    Polycystic ovarian syndrome 10/2014   UTI (lower urinary tract infection)    Yeast infection     Patient Active Problem List   Diagnosis Date Noted   Labor and delivery indication for care or intervention 02/03/2019   Preterm uterine contractions in third trimester, antepartum 12/20/2018   Abnormal uterine bleeding 08/14/2015   Dyspareunia in female 08/14/2015   PCOS (polycystic ovarian syndrome) 05/07/2015   Adnexal pain 11/08/2014   History of gestational diabetes 07/28/2013    Past Surgical History:  Procedure Laterality Date   CHOLECYSTECTOMY     HYSTEROSCOPY N/A 07/25/2015   Procedure: HYSTEROSCOPY;  Surgeon: Reva Bores, MD;  Location: [facility] ORS;  Service: Gynecology;  Laterality: N/A;   LAPAROSCOPY N/A 07/25/2015   Procedure: LAPAROSCOPY DIAGNOSTIC, POSTERIOR CUL DE SAC BIOPSY;  Surgeon: Reva Bores, MD;  Location: [facility]  ORS;  Service: Gynecology;  Laterality: N/A;   TONSILLECTOMY     WISDOM TOOTH EXTRACTION      Prior to Admission medications   Medication Sig Start Date End Date Taking? Authorizing Provider  acetaminophen (TYLENOL) 325 MG tablet Take 2 tablets (650 mg total) by mouth every 4 (four) hours as needed (for pain scale < 4). 02/05/19   McVey, Lurena Joiner A, CNM  ibuprofen (ADVIL) 600 MG tablet Take 1 tablet (600 mg total) by mouth every 6 (six) hours. 02/05/19   McVey, Prudencio Pair, CNM  Prenatal 28-0.8 MG TABS Take 1 tablet by mouth daily.    [provider]  senna-docusate (SENOKOT-S) 8.6-50 MG tablet Take 2 tablets by mouth daily. 02/06/19   McVey, Prudencio Pair, CNM    Allergies Other  Family History  Problem Relation Age of Onset   Hypertension Mother    Cancer Paternal Grandfather        brain tumor   Seizures Sister    Bipolar disorder Sister     Social History Social History   Tobacco Use   Smoking status: Never Smoker   Smokeless tobacco: Never Used  Substance Use Topics   Alcohol use: No   Drug use: No      Review of Systems Constitutional: Positive fever Eyes: No visual changes. ENT: No sore throat. Cardiovascular: Denies chest pain. Respiratory: Denies shortness of breath. Gastrointestinal: abdominal cramping lower  No nausea, no vomiting.  No diarrhea.  No constipation. Genitourinary: Negative for dysuria. Musculoskeletal: Negative  for back pain. Skin: Negative for rash. Neurological: Positive headache, no focal weakness or numbness. GU: Positive vaginal fluid leaking All other ROS negative ____________________________________________   PHYSICAL EXAM:  VITAL SIGNS: ED Triage Vitals [02/06/19 2227]  Enc Vitals Group     BP 136/81     Pulse Rate 96     Resp 20     Temp (!) 102.8 F (39.3 C)     Temp src      SpO2 97 %     Weight 170 lb (77.1 kg)     Height 5\' 4"  (1.626 m)     Head Circumference      Peak Flow      Pain Score 2     Pain  Loc      Pain Edu?      Excl. in GC?     Constitutional: Alert and oriented. Well appearing and in no acute distress but shivering Eyes: Conjunctivae are normal. EOMI. Head: Atraumatic. Nose: No congestion/rhinnorhea. Mouth/Throat: Mucous membranes are moist.   Neck: No stridor. Trachea Midline. FROM Cardiovascular: Normal rate, regular rhythm. Grossly normal heart sounds.  Good peripheral circulation.  Respiratory: Normal respiratory effort.  No retractions. Lungs CTAB. Gastrointestinal: Soft and nontender. Post gravid uterus. Musculoskeletal: No lower extremity tenderness nor edema.  No joint effusions. Neurologic:  Normal speech and language. No gross focal neurologic deficits are appreciated.  Skin:  Skin is warm, dry and intact. No rash noted on the breast  Psychiatric: Mood and affect are normal. Speech and behavior are normal. GU: Deferred   ____________________________________________   LABS (all labs ordered are listed, but only abnormal results are displayed)  Labs Reviewed  CBC WITH DIFFERENTIAL/PLATELET - Abnormal; Notable for the following components:      Result Value   WBC 13.0 (*)    RBC 3.75 (*)    Hemoglobin 10.9 (*)    HCT 32.8 (*)    Neutro Abs 11.1 (*)    Abs Immature Granulocytes 0.08 (*)    All other components within normal limits  COMPREHENSIVE METABOLIC PANEL - Abnormal; Notable for the following components:   Potassium 3.4 (*)    CO2 21 (*)    Glucose, Bld 106 (*)    Total Protein 6.1 (*)    Albumin 2.9 (*)    Total Bilirubin 0.1 (*)    All other components within normal limits  URINALYSIS, ROUTINE W REFLEX MICROSCOPIC - Abnormal; Notable for the following components:   Color, Urine YELLOW (*)    APPearance HAZY (*)    Hgb urine dipstick LARGE (*)    Leukocytes,Ua SMALL (*)    RBC / HPF >50 (*)    All other components within normal limits  LACTIC ACID, PLASMA - Abnormal; Notable for the following components:   Lactic Acid, Venous 2.4 (*)      All other components within normal limits  LACTIC ACID, PLASMA - Abnormal; Notable for the following components:   Lactic Acid, Venous 2.7 (*)    All other components within normal limits  SARS CORONAVIRUS 2 (HOSPITAL ORDER, PERFORMED IN [facility])  URINE CULTURE  CULTURE, BLOOD (ROUTINE X 2)  CULTURE, BLOOD (ROUTINE X 2)  PROTIME-INR  APTT  CBC  COMPREHENSIVE METABOLIC PANEL  CBC  LACTIC ACID, PLASMA   ____________________________________________   ED ECG REPORT I, Concha SeMary E Katalaya Beel, the attending physician, personally viewed and interpreted this ECG.  EKG sinus rate of 77, no ST elevation, no T wave inversion,  normal intervals ____________________________________________  RADIOLOGY Robert Bellow, personally viewed and evaluated these images (plain radiographs) as part of my medical decision making, as well as reviewing the written report by the radiologist.  ED MD interpretation: No pneumonia  Official radiology report(s): US Pelvis (transabdominal Only)  Result Date: 02/07/2019 CLINICAL DATA:  Initial evaluation for acute fever, status post recent vaginal delivery on 02/04/2019. EXAM: TRANSABDOMINAL ULTRASOUND OF PELVIS TECHNIQUE: Transabdominal ultrasound examination of the pelvis was performed including evaluation of the uterus, ovaries, adnexal regions, and pelvic cul-de-sac. COMPARISON:  None. FINDINGS: Uterus Measurements: 16.7 x 10.0 x 13.2 cm = volume: 1145.9 mL. Normal expected globular appearance of the uterus given postpartum status. No discrete fibroid or other mass. Endometrium Thickness: Up to 10.7 mm. No focal abnormality visualized. No increased vascularity or significant hyperemia to suggest endometritis. Right ovary Measurements: 3.1 x 2.1 x 2.7 cm = volume: 9.2 mL. Normal appearance/no adnexal mass. Left ovary Measurements: 3.6 x 1.8 x 3.3 cm = volume: 11.1 mL. Normal appearance/no adnexal mass. Other findings:  No abnormal free fluid. IMPRESSION: 1.  Normal expected sonographic appearance of the postpartum uterus and endometrium. Although predominantly a clinical diagnosis, no gas, free fluid, or other sonographic features to suggest acute endometritis identified. 2. Otherwise negative pelvic ultrasound. No other acute abnormality identified. Electronically Signed   By: Jeannine Boga M.D.   On: 02/07/2019 02:16   Dg Chest Portable 1 View  Result Date: 02/06/2019 CLINICAL DATA:  Fever. Sepsis workup. Recent postpartum. EXAM: PORTABLE CHEST 1 VIEW COMPARISON:  04/29/2018 FINDINGS: The cardiomediastinal contours are normal. Possible small pleural effusions. Pulmonary vasculature is normal. No consolidation or pneumothorax. No acute osseous abnormalities are seen. IMPRESSION: Possible small pleural effusions. No consolidation to suggest pneumonia. Electronically Signed   By: Keith Rake M.D.   On: 02/06/2019 23:53    ____________________________________________   PROCEDURES  Procedure(s) performed (including Critical Care):  .Critical Care Performed by: Vanessa Van, MD Authorized by: Vanessa Rogers, MD   Critical care provider statement:    Critical care time (minutes):  45   Critical care was necessary to treat or prevent imminent or life-threatening deterioration of the following conditions:  Sepsis   Critical care was time spent personally by me on the following activities:  Discussions with consultants, evaluation of patient's response to treatment, examination of patient, ordering and performing treatments and interventions, ordering and review of laboratory studies, ordering and review of radiographic studies, pulse oximetry, re-evaluation of patient's condition, obtaining history from patient or surrogate and review of old charts     ____________________________________________   INITIAL IMPRESSION / ASSESSMENT AND PLAN / ED COURSE  Amber Foster was evaluated in Emergency Department on 02/06/2019 for the symptoms  described in the history of present illness. She was evaluated in the context of the global COVID-19 pandemic, which necessitated consideration that the patient might be at risk for infection with the SARS-CoV-2 virus that causes COVID-19. Institutional protocols and algorithms that pertain to the evaluation of patients at risk for COVID-19 are in a state of rapid change based on information released by regulatory bodies including the CDC and federal and state organizations. These policies and algorithms were followed during the patient's care in the ED.    Patient presents with sepsis most likely secondary to her recent delivery.  Patient is just at 48 hours postpartum therefore will cover with clindamycin and gentamicin for concern for endometritis.  Will get labs evaluate for UTI, pneumonia, coronavirus but my  suspicion is less likely.  Will get blood cultures to evaluate for bacteremia.  Consider meningitis given patient's headache however patient has a supple neck and given her recent delivery and other symptoms it seems more likely to be related to endometritis.  Will get transvaginal ultrasound to ensure there is no retained products.  [facility]   11:31 PM Discussed with Dr. Feliberto GottronSchermerhorn about plan for TV US and antibiotics.  He is agreeable to plan.   1:42 AM white count elevated so pt hits sepsis criteria.  Sepsis alert called.  2:26 AM reevaluated patient.  Patient's headache is now resolved.  She is only having lower abdominal cramping at this time.  Ultrasound without any evidence of retained products.  Coronavirus testing was negative.  Will discuss with the OB team for admission   Discussed with the OB team and they will admit patient.      ____________________________________________   FINAL CLINICAL IMPRESSION(S) / ED DIAGNOSES   Final diagnoses:  Fever  Endometritis  Sepsis, due to unspecified organism, unspecified whether acute organ dysfunction present  (HCC)      MEDICATIONS GIVEN DURING THIS VISIT:  Medications  prenatal multivitamin tablet 1 tablet (has no administration in time range)  enoxaparin (LOVENOX) injection 40 mg (has no administration in time range)  lactated ringers 1,000 mL with potassium chloride 20 mEq infusion (has no administration in time range)  clindamycin (CLEOCIN) IVPB 900 mg (has no administration in time range)  gentamicin (GARAMYCIN) 390 mg in dextrose 5 % 100 mL IVPB (has no administration in time range)  ibuprofen (ADVIL) tablet 800 mg (has no administration in time range)  oxyCODONE (Oxy IR/ROXICODONE) immediate release tablet 5 mg (has no administration in time range)  ondansetron (ZOFRAN) tablet 4 mg (has no administration in time range)    Or  ondansetron (ZOFRAN) injection 4 mg (has no administration in time range)  gentamicin (GARAMYCIN) 120 mg in dextrose 5 % 50 mL IVPB (0 mg Intravenous Stopped 02/07/19 0128)  clindamycin (CLEOCIN) IVPB 900 mg (0 mg Intravenous Stopped 02/07/19 0206)  sodium chloride 0.9 % bolus 1,000 mL (0 mLs Intravenous Stopped 02/07/19 0129)  sodium chloride 0.9 % bolus 1,000 mL (0 mLs Intravenous Stopped 02/07/19 0129)  sodium chloride 0.9 % bolus 1,000 mL (1,000 mLs Intravenous New Bag/Given 02/07/19 0246)  acetaminophen (TYLENOL) tablet 1,000 mg (1,000 mg Oral Given 02/07/19 0315)     ED Discharge Orders    None       Note:  This document was prepared using Dragon voice recognition software and may include unintentional dictation errors.   Concha SeFunke, Bitania Shankland E, MD 02/07/19 [phone]

## 2019-02-07 ENCOUNTER — Encounter: Payer: Self-pay | Admitting: Anesthesiology

## 2019-02-07 ENCOUNTER — Emergency Department: Payer: BC Managed Care – PPO

## 2019-02-07 DIAGNOSIS — O864 Pyrexia of unknown origin following delivery: Secondary | ICD-10-CM | POA: Diagnosis present

## 2019-02-07 LAB — CBC
HCT: 32.4 % — ABNORMAL LOW (ref 36.0–46.0)
Hemoglobin: 10.5 g/dL — ABNORMAL LOW (ref 12.0–15.0)
MCH: 29.1 pg (ref 26.0–34.0)
MCHC: 32.4 g/dL (ref 30.0–36.0)
MCV: 89.8 fL (ref 80.0–100.0)
Platelets: 164 10*3/uL (ref 150–400)
RBC: 3.61 MIL/uL — ABNORMAL LOW (ref 3.87–5.11)
RDW: 14 % (ref 11.5–15.5)
WBC: 11.5 10*3/uL — ABNORMAL HIGH (ref 4.0–10.5)
nRBC: 0 % (ref 0.0–0.2)

## 2019-02-07 LAB — COMPREHENSIVE METABOLIC PANEL
ALT: 22 U/L (ref 0–44)
ALT: 21 U/L (ref 0–44)
AST: 25 U/L (ref 15–41)
AST: 24 U/L (ref 15–41)
Albumin: 2.9 g/dL — ABNORMAL LOW (ref 3.5–5.0)
Albumin: 2.5 g/dL — ABNORMAL LOW (ref 3.5–5.0)
Alkaline Phosphatase: 92 U/L (ref 38–126)
Alkaline Phosphatase: 80 U/L (ref 38–126)
Anion gap: 12 (ref 5–15)
Anion gap: 10 (ref 5–15)
BUN: 10 mg/dL (ref 6–20)
BUN: 8 mg/dL (ref 6–20)
CO2: 21 mmol/L — ABNORMAL LOW (ref 22–32)
CO2: 20 mmol/L — ABNORMAL LOW (ref 22–32)
Calcium: 9.3 mg/dL (ref 8.9–10.3)
Calcium: 7.9 mg/dL — ABNORMAL LOW (ref 8.9–10.3)
Chloride: 103 mmol/L (ref 98–111)
Chloride: 108 mmol/L (ref 98–111)
Creatinine, Ser: 0.57 mg/dL (ref 0.44–1.00)
Creatinine, Ser: 0.63 mg/dL (ref 0.44–1.00)
GFR calc Af Amer: >60 mL/min (ref >60)
GFR calc Af Amer: >60 mL/min (ref >60)
GFR calc non Af Amer: >60 mL/min (ref >60)
GFR calc non Af Amer: >60 mL/min (ref >60)
Glucose, Bld: 106 mg/dL — ABNORMAL HIGH (ref 70–99)
Glucose, Bld: 110 mg/dL — ABNORMAL HIGH (ref 70–99)
Potassium: 3.4 mmol/L — ABNORMAL LOW (ref 3.5–5.1)
Potassium: 3.3 mmol/L — ABNORMAL LOW (ref 3.5–5.1)
Sodium: 136 mmol/L (ref 135–145)
Sodium: 138 mmol/L (ref 135–145)
Total Bilirubin: 0.1 mg/dL — ABNORMAL LOW (ref 0.3–1.2)
Total Bilirubin: 0.4 mg/dL (ref 0.3–1.2)
Total Protein: 6.1 g/dL — ABNORMAL LOW (ref 6.5–8.1)
Total Protein: 5.6 g/dL — ABNORMAL LOW (ref 6.5–8.1)

## 2019-02-07 LAB — CBC WITH DIFFERENTIAL/PLATELET
Abs Immature Granulocytes: 0.08 10*3/uL — ABNORMAL HIGH (ref 0.00–0.07)
Basophils Absolute: 0 10*3/uL (ref 0.0–0.1)
Basophils Relative: 0 %
Eosinophils Absolute: 0.1 10*3/uL (ref 0.0–0.5)
Eosinophils Relative: 1 %
HCT: 32.8 % — ABNORMAL LOW (ref 36.0–46.0)
Hemoglobin: 10.9 g/dL — ABNORMAL LOW (ref 12.0–15.0)
Immature Granulocytes: 1 %
Lymphocytes Relative: 7 %
Lymphs Abs: 0.9 10*3/uL (ref 0.7–4.0)
MCH: 29.1 pg (ref 26.0–34.0)
MCHC: 33.2 g/dL (ref 30.0–36.0)
MCV: 87.5 fL (ref 80.0–100.0)
Monocytes Absolute: 0.8 10*3/uL (ref 0.1–1.0)
Monocytes Relative: 6 %
Neutro Abs: 11.1 10*3/uL — ABNORMAL HIGH (ref 1.7–7.7)
Neutrophils Relative %: 85 %
Platelets: 190 10*3/uL (ref 150–400)
RBC: 3.75 MIL/uL — ABNORMAL LOW (ref 3.87–5.11)
RDW: 13.8 % (ref 11.5–15.5)
WBC: 13 10*3/uL — ABNORMAL HIGH (ref 4.0–10.5)
nRBC: 0 % (ref 0.0–0.2)

## 2019-02-07 LAB — LACTIC ACID, PLASMA
Lactic Acid, Venous: 2.4 mmol/L (ref 0.5–1.9)
Lactic Acid, Venous: 2.7 mmol/L (ref 0.5–1.9)
Lactic Acid, Venous: 2.8 mmol/L (ref 0.5–1.9)

## 2019-02-07 LAB — SARS CORONAVIRUS 2 BY RT PCR (HOSPITAL ORDER, PERFORMED IN ~~LOC~~ HOSPITAL LAB): SARS Coronavirus 2: NEGATIVE

## 2019-02-07 LAB — PROTIME-INR
INR: 1 (ref 0.8–1.2)
Prothrombin Time: 12.7 seconds (ref 11.4–15.2)

## 2019-02-07 LAB — APTT: aPTT: 31 seconds (ref 24–36)

## 2019-02-07 MED ORDER — ENOXAPARIN SODIUM 40 MG/0.4ML ~~LOC~~ SOLN
40.0000 mg | Freq: Every day | SUBCUTANEOUS | Status: DC
  Filled 2019-02-07: qty 0.4

## 2019-02-07 MED ORDER — ACETAMINOPHEN 500 MG PO TABS
ORAL_TABLET | ORAL | Status: AC
  Filled 2019-02-07: qty 2

## 2019-02-07 MED ORDER — CLINDAMYCIN PHOSPHATE 900 MG/50ML IV SOLN
900.0000 mg | Freq: Three times a day (TID) | INTRAVENOUS | Status: DC
  Administered 2019-02-07 – 2019-02-10 (×10): 900 mg via INTRAVENOUS
  Filled 2019-02-07 (×14): qty 50

## 2019-02-07 MED ORDER — ONDANSETRON HCL 4 MG/2ML IJ SOLN: 4.0000 mg | Freq: Four times a day (QID) | INTRAMUSCULAR | Status: DC | PRN

## 2019-02-07 MED ORDER — ONDANSETRON HCL 4 MG PO TABS: 4.0000 mg | ORAL_TABLET | Freq: Four times a day (QID) | ORAL | Status: DC | PRN

## 2019-02-07 MED ORDER — POTASSIUM CHLORIDE CRYS ER 20 MEQ PO TBCR
40.0000 meq | EXTENDED_RELEASE_TABLET | Freq: Two times a day (BID) | ORAL | Status: DC
  Administered 2019-02-07 – 2019-02-08 (×2): 40 meq via ORAL
  Filled 2019-02-07 (×2): qty 2

## 2019-02-07 MED ORDER — POTASSIUM CHLORIDE 2 MEQ/ML IV SOLN
INTRAVENOUS | Status: DC
  Administered 2019-02-07 – 2019-02-10 (×9): via INTRAVENOUS
  Filled 2019-02-07 (×15): qty 1000

## 2019-02-07 MED ORDER — SODIUM CHLORIDE 0.9 % IV SOLN
2.0000 g | Freq: Four times a day (QID) | INTRAVENOUS | Status: DC
  Administered 2019-02-07 – 2019-02-10 (×13): 2 g via INTRAVENOUS
  Filled 2019-02-07 (×2): qty 2
  Filled 2019-02-07 (×2): qty 2000
  Filled 2019-02-07 (×2): qty 2
  Filled 2019-02-07: qty 2000
  Filled 2019-02-07: qty 2
  Filled 2019-02-07: qty 2000
  Filled 2019-02-07: qty 2
  Filled 2019-02-07: qty 2000
  Filled 2019-02-07 (×5): qty 2
  Filled 2019-02-07: qty 2000
  Filled 2019-02-07: qty 2

## 2019-02-07 MED ORDER — ACETAMINOPHEN 500 MG PO TABS
1000.0000 mg | ORAL_TABLET | Freq: Once | ORAL | Status: AC
  Administered 2019-02-07: 1000 mg via ORAL

## 2019-02-07 MED ORDER — SODIUM CHLORIDE 0.9 % IV BOLUS
1000.0000 mL | Freq: Once | INTRAVENOUS | Status: AC
  Administered 2019-02-07: 1000 mL via INTRAVENOUS

## 2019-02-07 MED ORDER — PRENATAL MULTIVITAMIN CH
1.0000 | ORAL_TABLET | Freq: Every day | ORAL | Status: DC
  Administered 2019-02-07 – 2019-02-09 (×3): 1 via ORAL
  Filled 2019-02-07 (×3): qty 1

## 2019-02-07 MED ORDER — GENTAMICIN SULFATE 40 MG/ML IJ SOLN
5.0000 mg/kg | INTRAVENOUS | Status: DC
  Administered 2019-02-07 – 2019-02-10 (×4): 390 mg via INTRAVENOUS
  Filled 2019-02-07 (×6): qty 9.75

## 2019-02-07 MED ORDER — ENOXAPARIN SODIUM 80 MG/0.8ML ~~LOC~~ SOLN
1.0000 mg/kg | Freq: Two times a day (BID) | SUBCUTANEOUS | Status: DC
  Filled 2019-02-07: qty 0.8

## 2019-02-07 MED ORDER — OXYCODONE HCL 5 MG PO TABS: 5.0000 mg | ORAL_TABLET | ORAL | Status: DC | PRN

## 2019-02-07 MED ORDER — ACETAMINOPHEN 500 MG PO TABS
1000.0000 mg | ORAL_TABLET | Freq: Four times a day (QID) | ORAL | Status: DC | PRN
  Administered 2019-02-07 – 2019-02-09 (×5): 1000 mg via ORAL
  Filled 2019-02-07 (×5): qty 2

## 2019-02-07 MED ORDER — IBUPROFEN 800 MG PO TABS
800.0000 mg | ORAL_TABLET | Freq: Three times a day (TID) | ORAL | Status: DC | PRN
  Administered 2019-02-07 – 2019-02-10 (×6): 800 mg via ORAL
  Filled 2019-02-07 (×6): qty 1

## 2019-02-07 MED ORDER — FENTANYL CITRATE (PF) 100 MCG/2ML IJ SOLN
50.0000 ug | INTRAMUSCULAR | Status: DC | PRN
  Administered 2019-02-07: 50 ug via INTRAVENOUS
  Filled 2019-02-07: qty 2

## 2019-02-07 NOTE — Progress Notes (Signed)
Pt taken to Obs for EMBx - culture and gram stain sent.   Vitals:   02/07/19 1956 02/07/19 2057 02/07/19 2113 02/07/19 2209  BP: 101/61   (!) 118/59  Pulse: 73  (!) 105 (!) 105  Resp: (!) 24  (!) 25   Temp: 98.2 F (36.8 C) 99.1 F (37.3 C) 99.1 F (37.3 C) (!) 103.1 F (39.5 C)  TempSrc: Oral Oral Oral Oral  SpO2: 99%  100% 97%  Weight:      Height:

## 2019-02-07 NOTE — Lactation Note (Signed)
Lactation Consultation Note  Patient Name: Amber Foster [id number] Date: 02/07/2019   [facility] was called by RN to speak with mom regarding feelings of fullness/engorgement due to being 3 days PP and choosing not to breastfeed with baby number 4.  Patient had been given cabbage leaves to provide comfort and begin the process of lactation cessation.  Patient is receiving tylenol for pain management. LC provided patient with education and suggestion of using a warm shower to express some milk to comfort, or to hand express milk to comfort in the breasts. Patient declined expressing milk stating that in the past she had pumped for her other children and it caused broken blood vessels and bloody milk to be discharged, she did not want to go through that again.  Encouraged patient that milk could be expressed without the use of a DEBP, and its just for comfort not for long periods until her breasts start to feel better. Also provided guidance on the dangers of not releasing any of the pressure through expression: plugged ducts, mastitis, and breast abscess. Patient stated that she would continue with the cabbage leaves, tylenol, and a tight fitting and support sports bra for now.  Maternal Data    Feeding    LATCH Score                   Interventions    Lactation Tools Discussed/Used     Consult Status      Amber Foster 02/07/2019, 3:30 PM

## 2019-02-07 NOTE — H&P (Signed)
Amber Foster is a 38 y.o. female.  2days PP from an uncomplicated SVD presented to ED with fever to 102 last pm. + rigors  . Watery d/c per vagina .  Not breast feeding  + elevated LActic acid in Ed   Prenatal care site: [facility]  Pregnancy complicated by GDM ( insulin requiring )  S; no SOB , No cough , No dysuria , No leg pain with ambulation    Maternal Medical History:   Past Medical History:  Diagnosis Date  . Back pain   . 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 Diabetes mellitus without complication ([place])   . Gestational diabetes   . Headache(784.0)   . Polycystic ovarian syndrome 10/2014  . UTI (lower urinary tract infection)   . Yeast infection     Past Surgical History:  Procedure Laterality Date  . CHOLECYSTECTOMY    . HYSTEROSCOPY N/A 07/25/2015   Procedure: HYSTEROSCOPY;  Surgeon: Donnamae Jude, MD;  Location: [facility];  Service: Gynecology;  Laterality: N/A;  . LAPAROSCOPY N/A 07/25/2015   Procedure: LAPAROSCOPY DIAGNOSTIC, POSTERIOR CUL DE [place] BIOPSY;  Surgeon: Donnamae Jude, MD;  Location: [facility];  Service: Gynecology;  Laterality: N/A;  . TONSILLECTOMY    . WISDOM TOOTH EXTRACTION      Allergies  Allergen Reactions  . Other Other (See Comments)    Pt prefers no liquid medications, causes shaking                                                                        Social History: She  reports that she has never smoked. She has never used smokeless tobacco. She reports that she does not drink alcohol or use drugs.  Family History: family history includes Bipolar disorder in her sister; Cancer in her paternal grandfather; Hypertension in her mother; Seizures in her sister.  no history of gyn cancers  Review of Systems: A full review of systems was performed and negative except as noted in the HPI.    Review of Systems: A full review of systems was performed and negative except as noted in the HPI.   Eyes: no vision change   Ears: left ear pain  Oropharynx: no sore throat  Pulmonary . No shortness of breath , no hemoptysis Cardiovascular: no chest pain , no irregular heart beat  Gastrointestinal:no blood in stool . No diarrhea, no constipation Uro gynecologic: no dysuria , no pelvic pain, + watery d/c per vagina  Neurologic : no seizure , no migraines    Musculoskeletal: no muscular weakness O:  BP 118/63   Pulse 95   Temp (!) 101.5 F (38.6 C) (Oral)   Resp (!) 24   Ht 5\' 4"  (1.626 m)   Wt 77.1 kg   LMP 04/11/2018   SpO2 100%   BMI 29.18 kg/m  Results for orders placed or performed during the hospital encounter of 02/06/19 (from the past 48 hour(s))  Urinalysis, Routine w reflex microscopic   Collection Time: 02/06/19 10:54 PM  Result Value Ref Range   Color, Urine YELLOW (A) YELLOW   APPearance HAZY (A) CLEAR   Specific Gravity, Urine 1.012 1.005 - 1.030   pH 6.0 5.0 - 8.0  Glucose, UA NEGATIVE NEGATIVE mg/dL   Hgb urine dipstick LARGE (A) NEGATIVE   Bilirubin Urine NEGATIVE NEGATIVE   Ketones, ur NEGATIVE NEGATIVE mg/dL   Protein, ur NEGATIVE NEGATIVE mg/dL   Nitrite NEGATIVE NEGATIVE   Leukocytes,Ua SMALL (A) NEGATIVE   RBC / HPF >50 (H) 0 - 5 RBC/hpf   WBC, UA 11-20 0 - 5 WBC/hpf   Bacteria, UA NONE SEEN NONE SEEN   Squamous Epithelial / LPF 6-10 0 - 5   Mucus PRESENT   Lactic acid, plasma   Collection Time: 02/06/19 10:54 PM  Result Value Ref Range   Lactic Acid, Venous 2.4 (HH) 0.5 - 1.9 mmol/L  SARS Coronavirus 2 [facility](Hospital order, Performed in [facility] lab) Nasopharyngeal Nasopharyngeal Swab   Collection Time: 02/06/19 11:10 PM   Specimen: Nasopharyngeal Swab  Result Value Ref Range   SARS Coronavirus 2 NEGATIVE NEGATIVE  CBC with Differential   Collection Time: 02/06/19 11:18 PM  Result Value Ref Range   WBC 13.0 (H) 4.0 - 10.5 K/uL   RBC 3.75 (L) 3.87 - 5.11 MIL/uL   Hemoglobin 10.9 (L) 12.0 - 15.0 g/dL   HCT 29.532.8 (L) 62.136.0 - 30.846.0 %   MCV 87.5 80.0 - 100.0 fL    MCH 29.1 26.0 - 34.0 pg   MCHC 33.2 30.0 - 36.0 g/dL   RDW 65.713.8 84.611.5 - 96.215.5 %   Platelets 190 150 - 400 K/uL   nRBC 0.0 0.0 - 0.2 %   Neutrophils Relative % 85 %   Neutro Abs 11.1 (H) 1.7 - 7.7 K/uL   Lymphocytes Relative 7 %   Lymphs Abs 0.9 0.7 - 4.0 K/uL   Monocytes Relative 6 %   Monocytes Absolute 0.8 0.1 - 1.0 K/uL   Eosinophils Relative 1 %   Eosinophils Absolute 0.1 0.0 - 0.5 K/uL   Basophils Relative 0 %   Basophils Absolute 0.0 0.0 - 0.1 K/uL   Immature Granulocytes 1 %   Abs Immature Granulocytes 0.08 (H) 0.00 - 0.07 K/uL  Comprehensive metabolic panel   Collection Time: 02/06/19 11:18 PM  Result Value Ref Range   Sodium 136 135 - 145 mmol/L   Potassium 3.4 (L) 3.5 - 5.1 mmol/L   Chloride 103 98 - 111 mmol/L   CO2 21 (L) 22 - 32 mmol/L   Glucose, Bld 106 (H) 70 - 99 mg/dL   BUN 10 6 - 20 mg/dL   Creatinine, Ser 9.520.57 0.44 - 1.00 mg/dL   Calcium 9.3 8.9 - 84.110.3 mg/dL   Total Protein 6.1 (L) 6.5 - 8.1 g/dL   Albumin 2.9 (L) 3.5 - 5.0 g/dL   AST 25 15 - 41 U/L   ALT 22 0 - 44 U/L   Alkaline Phosphatase 92 38 - 126 U/L   Total Bilirubin 0.1 (L) 0.3 - 1.2 mg/dL   GFR calc non Af Amer >60 >60 mL/min   GFR calc Af Amer >60 >60 mL/min   Anion gap 12 5 - 15  Protime-INR   Collection Time: 02/06/19 11:18 PM  Result Value Ref Range   Prothrombin Time 12.7 11.4 - 15.2 seconds   INR 1.0 0.8 - 1.2  APTT   Collection Time: 02/06/19 11:18 PM  Result Value Ref Range   aPTT 31 24 - 36 seconds  Lactic acid, plasma   Collection Time: 02/07/19  1:45 AM  Result Value Ref Range   Lactic Acid, Venous 2.7 (HH) 0.5 - 1.9 mmol/L  Results for orders placed or  performed during the hospital encounter of 02/03/19 (from the past 48 hour(s))  Glucose, capillary   Collection Time: 02/05/19  5:33 AM  Result Value Ref Range   Glucose-Capillary 108 (H) 70 - 99 mg/dL  CBC   Collection Time: 02/05/19  6:31 AM  Result Value Ref Range   WBC 10.5 4.0 - 10.5 K/uL   RBC 3.66 (L) 3.87 -  5.11 MIL/uL   Hemoglobin 10.6 (L) 12.0 - 15.0 g/dL   HCT 67.8 (L) 93.8 - 10.1 %   MCV 88.8 80.0 - 100.0 fL   MCH 29.0 26.0 - 34.0 pg   MCHC 32.6 30.0 - 36.0 g/dL   RDW 75.1 02.5 - 85.2 %   Platelets 170 150 - 400 K/uL   nRBC 0.0 0.0 - 0.2 %     Radiology : U/S ; Pelvic  CLINICAL DATA:  Initial evaluation for acute fever, status post recent vaginal delivery on 02/04/2019.  EXAM: TRANSABDOMINAL ULTRASOUND OF PELVIS  TECHNIQUE: Transabdominal ultrasound examination of the pelvis was performed including evaluation of the uterus, ovaries, adnexal regions, and pelvic cul-de-sac.  COMPARISON:  None.  FINDINGS: Uterus  Measurements: 16.7 x 10.0 x 13.2 cm = volume: 1145.9 mL. Normal expected globular appearance of the uterus given postpartum status. No discrete fibroid or other mass.  Endometrium  Thickness: Up to 10.7 mm. No focal abnormality visualized. No increased vascularity or significant hyperemia to suggest endometritis.  Right ovary  Measurements: 3.1 x 2.1 x 2.7 cm = volume: 9.2 mL. Normal appearance/no adnexal mass.  Left ovary  Measurements: 3.6 x 1.8 x 3.3 cm = volume: 11.1 mL. Normal appearance/no adnexal mass.  Other findings:  No abnormal free fluid.  IMPRESSION: 1. Normal expected sonographic appearance of the postpartum uterus and endometrium. Although predominantly a clinical diagnosis, no gas, free fluid, or other sonographic features to suggest acute endometritis identified. 2. Otherwise negative pelvic ultrasound. No other acute abnormality identified.     Constitutional:WDWN white female with chills in Mild distress HE/ENT: extraocular movements grossly intact, moist mucous membranes CV: RRR PULM: nl respiratory effort, CTABL     Abd: UTX 16 weeks  non-tender, non-distended, soft      Ext: Non-tender, Nonedmeatous   Psych: mood appropriate, speech normal  LLE : neg homan'sPelvic deferred    Assessment: 38 y.o. PP  fever with elevated LActic acid c/w sepsis . Etiology most like PPE , but uterus with minimal tenderness.U/S without evidence of retained products .  Possible Septic thrombophlebitis.    Principle diagnosis:   Plan:  Admit   IVF    ABX: clindamycin 900 mg q 8 hrs + Gentamycin 5 mg /kg q 24 hrs  Prophylactic Lovenox 40 mg q day   Blood cultures performed . Cont lactic acid measurements  I+O's   Tylenol q 4 hr s+ Motirn q 8 hrs while febrile   ----- Beverly Gust MD Attending Obstetrician and Gynecologist [facility], Department of OB/GYN [facility]

## 2019-02-07 NOTE — ED Notes (Signed)
Patient transported to Ultrasound 

## 2019-02-07 NOTE — Progress Notes (Signed)
38yo [id number] now postpartum day #4 from uncomplicated NSVD with Mc[place], who represented with high fever, chills and watery vaginal discharge. Blood cultures pending, no growth 12hrs, urine culture pending no growth 12hrs. Gram stain neg for organisms from embx.  Lactic acid continuing to rise, now at 2.8.  Pt on amp (added today)/gent and clinda for presumed endometritis. Improved clinically overall, but did just spike another fever to 103, after afebrile this afternoon with tylenol admission.  Working dx of endometritis, on abx now since 10am, 12 hrs. Will continue abx, temp control and await cultures. Will repeat lactic acid, cbc in am.  Hypokalemia: will replete.

## 2019-02-07 NOTE — ED Notes (Signed)
ED TO INPATIENT HANDOFF REPORT  ED Nurse Name and Phone #:  [name]  S Name/Age/Gender Amber Foster 38 y.o. female Room/Bed: ED02A/ED02A  Code Status   Code Status: Full Code  Home/SNF/Other Home Patient oriented to: self, place, time and situation Is this baseline? Yes   Triage Complete: Triage complete  Chief Complaint Fever/chills  Triage Note Pt comes via POV from home with c/o fever, headache and chills. Pt states she just delivered on Saturday at 37 weeks vaginally.  Pt also states they did an inversion to flip baby and was induced. Pt also states gestational diabetes, high fluids in belly during pregnancy.  Pt denies any swelling to extremities. Pt states this started about 8pm tonight.   Pt denies any CP, SOB or blurred vision. Pt does state some weakness. Pt is shivering but diaphoretic.   Allergies Allergies  Allergen Reactions  . Other Other (See Comments)    Pt prefers no liquid medications, causes shaking    Level of Care/Admitting Diagnosis ED Disposition    ED Disposition Condition Comment   Admit  [facility]ospital Area[facility] [[id number]]  Level of Care: Postpartum [19]  Covid Evaluation: Confirmed COVID Negative  Diagnosis: Fever of unknown origin following delivery, postpartum [[id number]]  Admitting Physician: Suzy BouchardSCHERMERHORN, THOMAS J [[id number]]  Attending Physician: Suzy BouchardSCHERMERHORN, THOMAS J [[id number]]  Estimated length of stay: 3 - 4 days  Certification:: I certify this patient will need inpatient services for at least 2 midnights  PT Class (Do Not Modify): Inpatient [101]  PT Acc Code (Do Not Modify): Private [1]       B Medical/Surgery History Past Medical History:  Diagnosis Date  . Back pain   . Congenital anomaly of spinal meninges ([facility])   . Diabetes mellitus without complication ([facility])   . Gestational diabetes   . Headache(784.0)   . Polycystic ovarian syndrome 10/2014  . UTI (lower urinary tract infection)   . Yeast infection     Past Surgical History:  Procedure Laterality Date  . CHOLECYSTECTOMY    . HYSTEROSCOPY N/A 07/25/2015   Procedure: HYSTEROSCOPY;  Surgeon: Reva Boresanya S Pratt, MD;  Location: [facility];  Service: Gynecology;  Laterality: N/A;  . LAPAROSCOPY N/A 07/25/2015   Procedure: LAPAROSCOPY DIAGNOSTIC, POSTERIOR CUL DE [place] BIOPSY;  Surgeon: Reva Boresanya S Pratt, MD;  Location: [facility];  Service: Gynecology;  Laterality: N/A;  . TONSILLECTOMY    . WISDOM TOOTH EXTRACTION       A IV Location/Drains/Wounds Patient Lines/Drains/Airways Status   Active Line/Drains/Airways    Name:   Placement date:   Placement time:   Site:   Days:   Peripheral IV 02/06/19 Right Forearm   02/06/19    2345    Forearm   1   Peripheral IV 02/06/19 Left Wrist   02/06/19    2350    Wrist   1          Intake/Output Last 24 hours No intake or output data in the 24 hours ending 02/07/19 0459  Labs/Imaging Results for orders placed or performed during the hospital encounter of 02/06/19 (from the past 48 hour(s))  Urinalysis, Routine w reflex microscopic     Status: Abnormal   Collection Time: 02/06/19 10:54 PM  Result Value Ref Range   Color, Urine YELLOW (A) YELLOW   APPearance HAZY (A) CLEAR   Specific Gravity, Urine 1.012 1.005 - 1.030   pH 6.0 5.0 - 8.0   Glucose, UA NEGATIVE NEGATIVE mg/dL   Hgb urine  dipstick LARGE (A) NEGATIVE   Bilirubin Urine NEGATIVE NEGATIVE   Ketones, ur NEGATIVE NEGATIVE mg/dL   Protein, ur NEGATIVE NEGATIVE mg/dL   Nitrite NEGATIVE NEGATIVE   Leukocytes,Ua SMALL (A) NEGATIVE   RBC / HPF >50 (H) 0 - 5 RBC/hpf   WBC, UA 11-20 0 - 5 WBC/hpf   Bacteria, UA NONE SEEN NONE SEEN   Squamous Epithelial / LPF 6-10 0 - 5   Mucus PRESENT     Comment: Performed at [facility], [address] [place], [place] [address]  Lactic acid, plasma     Status: Abnormal   Collection Time: 02/06/19 10:54 PM  Result Value Ref Range   Lactic Acid, Venous 2.4 (HH) 0.5 - 1.9 mmol/L    Comment: CRITICAL  RESULT CALLED TO, READ BACK BY AND VERIFIED WITH RAQUEL DAVID RN AT 0045 ON 02/07/2019 [facility] Performed at [facility], [address] [place], [place] [address]   SARS Coronavirus 2 [facility] order, Performed in [facility] hospital lab) Nasopharyngeal Nasopharyngeal Swab     Status: None   Collection Time: 02/06/19 11:10 PM   Specimen: Nasopharyngeal Swab  Result Value Ref Range   SARS Coronavirus 2 NEGATIVE NEGATIVE    Comment: (NOTE) If result is NEGATIVE SARS-CoV-2 target nucleic acids are NOT DETECTED. The SARS-CoV-2 RNA is generally detectable in upper and lower  respiratory specimens during the acute phase of infection. The lowest  concentration of SARS-CoV-2 viral copies this assay can detect is 250  copies / mL. A negative result does not preclude SARS-CoV-2 infection  and should not be used as the sole basis for treatment or other  patient management decisions.  A negative result may occur with  improper specimen collection / handling, submission of specimen other  than nasopharyngeal swab, presence of viral mutation(s) within the  areas targeted by this assay, and inadequate number of viral copies  (<250 copies / mL). A negative result must be combined with clinical  observations, patient history, and epidemiological information. If result is POSITIVE SARS-CoV-2 target nucleic acids are DETECTED. The SARS-CoV-2 RNA is generally detectable in upper and lower  respiratory specimens dur ing the acute phase of infection.  Positive  results are indicative of active infection with SARS-CoV-2.  Clinical  correlation with patient history and other diagnostic information is  necessary to determine patient infection status.  Positive results do  not rule out bacterial infection or co-infection with other viruses. If result is PRESUMPTIVE POSTIVE SARS-CoV-2 nucleic acids MAY BE PRESENT.   A presumptive positive result was obtained on the submitted specimen  and confirmed on  repeat testing.  While 2019 novel coronavirus  (SARS-CoV-2) nucleic acids may be present in the submitted sample  additional confirmatory testing may be necessary for epidemiological  and / or clinical management purposes  to differentiate between  SARS-CoV-2 and other Sarbecovirus currently known to infect humans.  If clinically indicated additional testing with an alternate test  methodology [phone]) is advised. The SARS-CoV-2 RNA is generally  detectable in upper and lower respiratory sp ecimens during the acute  phase of infection. The expected result is Negative. Fact Sheet for Patients:  BoilerBrush.com.cy Fact Sheet for Healthcare Providers: https://pope.com/ This test is not yet approved or cleared by the [place] FDA and has been authorized for detection and/or diagnosis of SARS-CoV-2 by FDA under an Emergency Use Authorization (EUA).  This EUA will remain in effect (meaning this test can be used) for the duration of the COVID-19  declaration under Section 564(b)(1) of the Act, 21 U.S.C. section 360bbb-3(b)(1), unless the authorization is terminated or revoked sooner. Performed at [facility], [address] [place], [place] [address]   CBC with Differential     Status: Abnormal   Collection Time: 02/06/19 11:18 PM  Result Value Ref Range   WBC 13.0 (H) 4.0 - 10.5 K/uL   RBC 3.75 (L) 3.87 - 5.11 MIL/uL   Hemoglobin 10.9 (L) 12.0 - 15.0 g/dL   HCT 88.8 (L) 91.6 - 94.5 %   MCV 87.5 80.0 - 100.0 fL   MCH 29.1 26.0 - 34.0 pg   MCHC 33.2 30.0 - 36.0 g/dL   RDW 03.8 88.2 - 80.0 %   Platelets 190 150 - 400 K/uL   nRBC 0.0 0.0 - 0.2 %   Neutrophils Relative % 85 %   Neutro Abs 11.1 (H) 1.7 - 7.7 K/uL   Lymphocytes Relative 7 %   Lymphs Abs 0.9 0.7 - 4.0 K/uL   Monocytes Relative 6 %   Monocytes Absolute 0.8 0.1 - 1.0 K/uL   Eosinophils Relative 1 %   Eosinophils Absolute 0.1 0.0 - 0.5 K/uL   Basophils  Relative 0 %   Basophils Absolute 0.0 0.0 - 0.1 K/uL   Immature Granulocytes 1 %   Abs Immature Granulocytes 0.08 (H) 0.00 - 0.07 K/uL    Comment: Performed at [facility], [address] [place], [place] [address]  Comprehensive metabolic panel     Status: Abnormal   Collection Time: 02/06/19 11:18 PM  Result Value Ref Range   Sodium 136 135 - 145 mmol/L   Potassium 3.4 (L) 3.5 - 5.1 mmol/L   Chloride 103 98 - 111 mmol/L   CO2 21 (L) 22 - 32 mmol/L   Glucose, Bld 106 (H) 70 - 99 mg/dL   BUN 10 6 - 20 mg/dL   Creatinine, Ser 9.15 0.44 - 1.00 mg/dL   Calcium 9.3 8.9 - 05.6 mg/dL   Total Protein 6.1 (L) 6.5 - 8.1 g/dL   Albumin 2.9 (L) 3.5 - 5.0 g/dL   AST 25 15 - 41 U/L   ALT 22 0 - 44 U/L   Alkaline Phosphatase 92 38 - 126 U/L   Total Bilirubin 0.1 (L) 0.3 - 1.2 mg/dL   GFR calc non Af Amer >60 >60 mL/min   GFR calc Af Amer >60 >60 mL/min   Anion gap 12 5 - 15    Comment: Performed at [facility], [address] [place], [place] [address]  Protime-INR     Status: None   Collection Time: 02/06/19 11:18 PM  Result Value Ref Range   Prothrombin Time 12.7 11.4 - 15.2 seconds   INR 1.0 0.8 - 1.2    Comment: (NOTE) INR goal varies based on device and disease states. Performed at [facility], [address] [place], [place] [address]   APTT     Status: None   Collection Time: 02/06/19 11:18 PM  Result Value Ref Range   aPTT 31 24 - 36 seconds    Comment: Performed at [facility], [address] [place], [place] [address]  Lactic acid, plasma     Status: Abnormal   Collection Time: 02/07/19  1:45 AM  Result Value Ref Range   Lactic Acid, Venous 2.7 (HH) 0.5 - 1.9 mmol/L    Comment: CRITICAL RESULT CALLED TO, READ BACK BY AND VERIFIED WITH JENN DAILEY RN AT 0236 ON 02/07/2019 [facility]  Performed at [facility], [address] [place], [place] [address]    [place] Pelvis (transabdominal Only)  Result Date: 02/07/2019 CLINICAL  DATA:  Initial evaluation for acute fever, status post recent vaginal delivery on 02/04/2019. EXAM: TRANSABDOMINAL ULTRASOUND OF PELVIS TECHNIQUE: Transabdominal ultrasound examination of the pelvis was performed including evaluation of the uterus, ovaries, adnexal regions, and pelvic cul-de-sac. COMPARISON:  None. FINDINGS: Uterus Measurements: 16.7 x 10.0 x 13.2 cm = volume: 1145.9 mL. Normal expected globular appearance of the uterus given postpartum status. No discrete fibroid or other mass. Endometrium Thickness: Up to 10.7 mm. No focal abnormality visualized. No increased vascularity or significant hyperemia to suggest endometritis. Right ovary Measurements: 3.1 x 2.1 x 2.7 cm = volume: 9.2 mL. Normal appearance/no adnexal mass. Left ovary Measurements: 3.6 x 1.8 x 3.3 cm = volume: 11.1 mL. Normal appearance/no adnexal mass. Other findings:  No abnormal free fluid. IMPRESSION: 1. Normal expected sonographic appearance of the postpartum uterus and endometrium. Although predominantly a clinical diagnosis, no gas, free fluid, or other sonographic features to suggest acute endometritis identified. 2. Otherwise negative pelvic ultrasound. No other acute abnormality identified. Electronically Signed   By: Rise MuBenjamin  McClintock M.D.   On: 02/07/2019 02:16   Dg Chest Portable 1 View  Result Date: 02/06/2019 CLINICAL DATA:  Fever. Sepsis workup. Recent postpartum. EXAM: PORTABLE CHEST 1 VIEW COMPARISON:  04/29/2018 FINDINGS: The cardiomediastinal contours are normal. Possible small pleural effusions. Pulmonary vasculature is normal. No consolidation or pneumothorax. No acute osseous abnormalities are seen. IMPRESSION: Possible small pleural effusions. No consolidation to suggest pneumonia. Electronically Signed   By: Narda RutherfordMelanie  Sanford M.D.   On: 02/06/2019 23:53    Pending Labs Unresulted Labs (From admission, onward)    Start     Ordered   02/07/19 0500  Comprehensive metabolic panel  Tomorrow morning,   STAT      02/07/19 0405   02/07/19 0500  CBC  Tomorrow morning,   STAT     02/07/19 0405   02/07/19 0500  Lactic acid, plasma  Tomorrow morning,   STAT     02/07/19 0405   02/07/19 0356  CBC  (enoxaparin (LOVENOX)    CrCl >/= 30 ml/min)  Once,   STAT    Comments: Baseline for enoxaparin therapy IF NOT ALREADY DRAWN.  Notify MD if PLT < 100 K.    02/07/19 0405   02/06/19 2254  Urine culture  ONCE - STAT,   STAT     02/06/19 2254   02/06/19 2254  Blood culture (routine x 2)  BLOOD CULTURE X 2,   STAT     02/06/19 2254          Vitals/Pain Today's Vitals   02/07/19 0030 02/07/19 0130 02/07/19 0311 02/07/19 0315  BP: 118/63     Pulse:  78  95  Resp: (!) 27 (!) 24    Temp:   (!) 101.5 F (38.6 C)   TempSrc:   Oral   SpO2:  96%  100%  Weight:      Height:      PainSc:        Isolation Precautions No active isolations  Medications Medications  prenatal multivitamin tablet 1 tablet (has no administration in time range)  enoxaparin (LOVENOX) injection 40 mg (has no administration in time range)  lactated ringers 1,000 mL with potassium chloride 20 mEq infusion (has no administration in time range)  clindamycin (CLEOCIN) IVPB 900 mg (has no administration in time range)  gentamicin (GARAMYCIN) 390 mg in dextrose 5 % 100 mL IVPB (has no administration in time range)  ibuprofen (ADVIL) tablet 800 mg (has no administration in time range)  oxyCODONE (Oxy IR/ROXICODONE) immediate release tablet 5 mg (has no administration in time range)  ondansetron (ZOFRAN) tablet 4 mg (has no administration in time range)    Or  ondansetron (ZOFRAN) injection 4 mg (has no administration in time range)  gentamicin (GARAMYCIN) 120 mg in dextrose 5 % 50 mL IVPB (0 mg Intravenous Stopped 02/07/19 0128)  clindamycin (CLEOCIN) IVPB 900 mg (0 mg Intravenous Stopped 02/07/19 0206)  sodium chloride 0.9 % bolus 1,000 mL (0 mLs Intravenous Stopped 02/07/19 0129)  sodium chloride 0.9 % bolus 1,000 mL (0 mLs Intravenous  Stopped 02/07/19 0129)  sodium chloride 0.9 % bolus 1,000 mL (1,000 mLs Intravenous New Bag/Given 02/07/19 0246)  acetaminophen (TYLENOL) tablet 1,000 mg (1,000 mg Oral Given 02/07/19 0315)    Mobility walks Low fall risk   Focused Assessments post partum, sepsis   R Recommendations: See Admitting Provider Note  Report given to:   Additional Notes:  SVD 8/29

## 2019-02-08 LAB — CBC WITH DIFFERENTIAL/PLATELET
Abs Immature Granulocytes: 0.13 10*3/uL — ABNORMAL HIGH (ref 0.00–0.07)
Basophils Absolute: 0 10*3/uL (ref 0.0–0.1)
Basophils Relative: 0 %
Eosinophils Absolute: 0.1 10*3/uL (ref 0.0–0.5)
Eosinophils Relative: 1 %
HCT: 31.9 % — ABNORMAL LOW (ref 36.0–46.0)
Hemoglobin: 10.3 g/dL — ABNORMAL LOW (ref 12.0–15.0)
Immature Granulocytes: 1 %
Lymphocytes Relative: 11 %
Lymphs Abs: 1.4 10*3/uL (ref 0.7–4.0)
MCH: 28.7 pg (ref 26.0–34.0)
MCHC: 32.3 g/dL (ref 30.0–36.0)
MCV: 88.9 fL (ref 80.0–100.0)
Monocytes Absolute: 0.8 10*3/uL (ref 0.1–1.0)
Monocytes Relative: 6 %
Neutro Abs: 10 10*3/uL — ABNORMAL HIGH (ref 1.7–7.7)
Neutrophils Relative %: 81 %
Platelets: 160 10*3/uL (ref 150–400)
RBC: 3.59 MIL/uL — ABNORMAL LOW (ref 3.87–5.11)
RDW: 14.1 % (ref 11.5–15.5)
WBC: 12.4 10*3/uL — ABNORMAL HIGH (ref 4.0–10.5)
nRBC: 0 % (ref 0.0–0.2)

## 2019-02-08 LAB — COMPREHENSIVE METABOLIC PANEL
ALT: 20 U/L (ref 0–44)
AST: 14 U/L — ABNORMAL LOW (ref 15–41)
Albumin: 2.3 g/dL — ABNORMAL LOW (ref 3.5–5.0)
Alkaline Phosphatase: 74 U/L (ref 38–126)
Anion gap: 6 (ref 5–15)
BUN: 8 mg/dL (ref 6–20)
CO2: 24 mmol/L (ref 22–32)
Calcium: 8.2 mg/dL — ABNORMAL LOW (ref 8.9–10.3)
Chloride: 108 mmol/L (ref 98–111)
Creatinine, Ser: 0.51 mg/dL (ref 0.44–1.00)
GFR calc Af Amer: >60 mL/min (ref >60)
GFR calc non Af Amer: >60 mL/min (ref >60)
Glucose, Bld: 106 mg/dL — ABNORMAL HIGH (ref 70–99)
Potassium: 3.9 mmol/L (ref 3.5–5.1)
Sodium: 138 mmol/L (ref 135–145)
Total Bilirubin: 0.2 mg/dL — ABNORMAL LOW (ref 0.3–1.2)
Total Protein: 5.3 g/dL — ABNORMAL LOW (ref 6.5–8.1)

## 2019-02-08 LAB — LACTIC ACID, PLASMA: Lactic Acid, Venous: 0.8 mmol/L (ref 0.5–1.9)

## 2019-02-08 MED ORDER — ENOXAPARIN SODIUM 80 MG/0.8ML ~~LOC~~ SOLN
1.0000 mg/kg | Freq: Two times a day (BID) | SUBCUTANEOUS | Status: DC
  Administered 2019-02-08 – 2019-02-10 (×4): 75 mg via SUBCUTANEOUS
  Filled 2019-02-08 (×4): qty 0.8

## 2019-02-08 NOTE — Progress Notes (Signed)
Dr. Leonides Schanz notified pt HR and temperature spiked again.

## 2019-02-09 LAB — CBC WITH DIFFERENTIAL/PLATELET
Abs Immature Granulocytes: 0.11 10*3/uL — ABNORMAL HIGH (ref 0.00–0.07)
Basophils Absolute: 0 10*3/uL (ref 0.0–0.1)
Basophils Relative: 0 %
Eosinophils Absolute: 0.1 10*3/uL (ref 0.0–0.5)
Eosinophils Relative: 1 %
HCT: 31.6 % — ABNORMAL LOW (ref 36.0–46.0)
Hemoglobin: 10.2 g/dL — ABNORMAL LOW (ref 12.0–15.0)
Immature Granulocytes: 1 %
Lymphocytes Relative: 12 %
Lymphs Abs: 1.1 10*3/uL (ref 0.7–4.0)
MCH: 28.9 pg (ref 26.0–34.0)
MCHC: 32.3 g/dL (ref 30.0–36.0)
MCV: 89.5 fL (ref 80.0–100.0)
Monocytes Absolute: 0.7 10*3/uL (ref 0.1–1.0)
Monocytes Relative: 7 %
Neutro Abs: 7.2 10*3/uL (ref 1.7–7.7)
Neutrophils Relative %: 79 %
Platelets: 174 10*3/uL (ref 150–400)
RBC: 3.53 MIL/uL — ABNORMAL LOW (ref 3.87–5.11)
RDW: 14.5 % (ref 11.5–15.5)
WBC: 9.2 10*3/uL (ref 4.0–10.5)
nRBC: 0 % (ref 0.0–0.2)

## 2019-02-09 NOTE — Progress Notes (Signed)
Obstetric and Gynecology  HD # 4  Subjective  Patient doing well, no complaints, tolerating PO intake, tolerating pain with PO meds, ambulating without difficulty, voiding spontaneously.     Denies CP, SOB, F/C, N/V/D, or leg pain. She feels well and wants to go home as soon as possible.    Objective  Objective:   Vitals:   02/09/19 1122 02/09/19 1516 02/09/19 1635 02/09/19 1945  BP: 110/76 129/74  124/77  Pulse:  87  70  Resp: 18 18    Temp: 99.6 F (37.6 C) 100.3 F (37.9 C) 99.6 F (37.6 C) 98.2 F (36.8 C)  TempSrc: Oral Oral Oral Oral  SpO2:    99%  Weight:      Height:       Temp:  [97.7 F (36.5 C)-100.7 F (38.2 C)] 98.2 F (36.8 C) (09/03 1945) Pulse Rate:  [63-102] 70 (09/03 1945) Resp:  [16-18] 18 (09/03 1516) BP: (100-129)/(44-77) 124/77 (09/03 1945) SpO2:  [97 %-100 %] 99 % (09/03 1945) I/O last 3 completed shifts: In: 6446.3 [P.O.:1800; I.V.:3452.8; IV Piggyback:1193.5] Out: 3300 [Urine:3300] No intake/output data recorded.  Intake/Output Summary (Last 24 hours) at 02/09/2019 1948 Last data filed at 02/09/2019 1830 Gross per 24 hour  Intake 6086.32 ml  Output 3000 ml  Net 3086.32 ml     Current Vital Signs 24h Vital Sign Ranges  T 98.2 F (36.8 C) Temp  Avg: 99 F (37.2 C)  Min: 97.7 F (36.5 C)  Max: 100.7 F (38.2 C)  BP 124/77 BP  Min: 100/57  Max: 129/74  HR 70 Pulse  Avg: 76.9  Min: 63  Max: 102  RR 18 Resp  Avg: 17.7  Min: 16  Max: 18  SaO2 99 % Room Air SpO2  Avg: 98.8 %  Min: 97 %  Max: 100 %           24 Hour I/O Current Shift I/O  Time Ins Outs 09/02 0701 - 09/03 0700 In: 8676 [P.O.:840; I.V.:2426.2] Out: 2300 [Urine:2300] No intake/output data recorded.    Physical Exam:  General: alert, cooperative, appears stated age and no distress Breasts: soft/nontender CV: RRR Pulm: nl effort, CTABL Abdomen: soft, non-tender, active bowel sounds Uterine Fundus: firm Lochia: appropriate DVT Evaluation: No evidence of DVT seen  on physical exam. Negative Homan's sign. No cords or calf tenderness. No significant calf/ankle edema.   Labs: Results for orders placed or performed during the hospital encounter of 02/06/19 (from the past 24 hour(s))  CBC with Differential/Platelet     Status: Abnormal   Collection Time: 02/09/19  8:03 AM  Result Value Ref Range   WBC 9.2 4.0 - 10.5 K/uL   RBC 3.53 (L) 3.87 - 5.11 MIL/uL   Hemoglobin 10.2 (L) 12.0 - 15.0 g/dL   HCT 31.6 (L) 36.0 - 46.0 %   MCV 89.5 80.0 - 100.0 fL   MCH 28.9 26.0 - 34.0 pg   MCHC 32.3 30.0 - 36.0 g/dL   RDW 14.5 11.5 - 15.5 %   Platelets 174 150 - 400 K/uL   nRBC 0.0 0.0 - 0.2 %   Neutrophils Relative % 79 %   Neutro Abs 7.2 1.7 - 7.7 K/uL   Lymphocytes Relative 12 %   Lymphs Abs 1.1 0.7 - 4.0 K/uL   Monocytes Relative 7 %   Monocytes Absolute 0.7 0.1 - 1.0 K/uL   Eosinophils Relative 1 %   Eosinophils Absolute 0.1 0.0 - 0.5 K/uL   Basophils Relative 0 %  Basophils Absolute 0.0 0.0 - 0.1 K/uL   Immature Granulocytes 1 %   Abs Immature Granulocytes 0.11 (H) 0.00 - 0.07 K/uL    Cultures: Results for orders placed or performed during the hospital encounter of 02/06/19  SARS Coronavirus 2 [facility] order, Performed in [facility] hospital lab) Nasopharyngeal Nasopharyngeal Swab     Status: None   Collection Time: 02/06/19 11:10 PM   Specimen: Nasopharyngeal Swab  Result Value Ref Range Status   SARS Coronavirus 2 NEGATIVE NEGATIVE Final    Comment: (NOTE) If result is NEGATIVE SARS-CoV-2 target nucleic acids are NOT DETECTED. The SARS-CoV-2 RNA is generally detectable in upper and lower  respiratory specimens during the acute phase of infection. The lowest  concentration of SARS-CoV-2 viral copies this assay can detect is 250  copies / mL. A negative result does not preclude SARS-CoV-2 infection  and should not be used as the sole basis for treatment or other  patient management decisions.  A negative result may occur with  improper  specimen collection / handling, submission of specimen other  than nasopharyngeal swab, presence of viral mutation(s) within the  areas targeted by this assay, and inadequate number of viral copies  (<250 copies / mL). A negative result must be combined with clinical  observations, patient history, and epidemiological information. If result is POSITIVE SARS-CoV-2 target nucleic acids are DETECTED. The SARS-CoV-2 RNA is generally detectable in upper and lower  respiratory specimens dur ing the acute phase of infection.  Positive  results are indicative of active infection with SARS-CoV-2.  Clinical  correlation with patient history and other diagnostic information is  necessary to determine patient infection status.  Positive results do  not rule out bacterial infection or co-infection with other viruses. If result is PRESUMPTIVE POSTIVE SARS-CoV-2 nucleic acids MAY BE PRESENT.   A presumptive positive result was obtained on the submitted specimen  and confirmed on repeat testing.  While 2019 novel coronavirus  (SARS-CoV-2) nucleic acids may be present in the submitted sample  additional confirmatory testing may be necessary for epidemiological  and / or clinical management purposes  to differentiate between  SARS-CoV-2 and other Sarbecovirus currently known to infect humans.  If clinically indicated additional testing with an alternate test  methodology [phone]) is advised. The SARS-CoV-2 RNA is generally  detectable in upper and lower respiratory sp ecimens during the acute  phase of infection. The expected result is Negative. Fact Sheet for Patients:  BoilerBrush.com.cy Fact Sheet for Healthcare Providers: https://pope.com/ This test is not yet approved or cleared by the [place] FDA and has been authorized for detection and/or diagnosis of SARS-CoV-2 by FDA under an Emergency Use Authorization (EUA).  This EUA will remain in  effect (meaning this test can be used) for the duration of the COVID-19 declaration under Section 564(b)(1) of the Act, 21 U.S.C. section 360bbb-3(b)(1), unless the authorization is terminated or revoked sooner. Performed at [facility], [address] [place], [place] [address]   Blood culture (routine x 2)     Status: None (Preliminary result)   Collection Time: 02/06/19 11:18 PM   Specimen: BLOOD  Result Value Ref Range Status   Specimen Description BLOOD L WRIST  Final   Special Requests   Final    BOTTLES DRAWN AEROBIC AND ANAEROBIC Blood Culture results may not be optimal due to an excessive volume of blood received in culture bottles   Culture   Final    NO GROWTH 2 DAYS Performed at  [facility], [address] [place], [place] [address]    Report Status PENDING  Incomplete  Blood culture (routine x 2)     Status: None (Preliminary result)   Collection Time: 02/06/19 11:18 PM   Specimen: BLOOD  Result Value Ref Range Status   Specimen Description BLOOD RT FOREARM  Final   Special Requests   Final    BOTTLES DRAWN AEROBIC AND ANAEROBIC Blood Culture results may not be optimal due to an excessive volume of blood received in culture bottles   Culture   Final    NO GROWTH 2 DAYS Performed at [facility], [address] [place], [place] [address]    Report Status PENDING  Incomplete  Aerobic/Anaerobic Culture (surgical/deep wound)     Status: None (Preliminary result)   Collection Time: 02/07/19  1:44 PM   Specimen: Endometrium  Result Value Ref Range Status   Specimen Description   Final    ENDOMETRIUM Performed at [facility], [address] [place], [place] [address]    Special Requests   Final    NONE Performed at [facility], [address] [place], [place] [address]    Gram Stain   Final    MODERATE WBC SEEN NO ORGANISMS SEEN Performed at [facility], [address] [place], [place]  [address]    Culture   Final    RARE ESCHERICHIA COLI SUSCEPTIBILITIES TO FOLLOW NO ANAEROBES ISOLATED; CULTURE IN PROGRESS FOR 5 DAYS    Report Status PENDING  Incomplete    Imaging: Koreas Pelvis (transabdominal Only)  Result Date: 02/07/2019 CLINICAL DATA:  Initial evaluation for acute fever, status post recent vaginal delivery on 02/04/2019. EXAM: TRANSABDOMINAL ULTRASOUND OF PELVIS TECHNIQUE: Transabdominal ultrasound examination of the pelvis was performed including evaluation of the uterus, ovaries, adnexal regions, and pelvic cul-de-sac. COMPARISON:  None. FINDINGS: Uterus Measurements: 16.7 x 10.0 x 13.2 cm = volume: 1145.9 mL. Normal expected globular appearance of the uterus given postpartum status. No discrete fibroid or other mass. Endometrium Thickness: Up to 10.7 mm. No focal abnormality visualized. No increased vascularity or significant hyperemia to suggest endometritis. Right ovary Measurements: 3.1 x 2.1 x 2.7 cm = volume: 9.2 mL. Normal appearance/no adnexal mass. Left ovary Measurements: 3.6 x 1.8 x 3.3 cm = volume: 11.1 mL. Normal appearance/no adnexal mass. Other findings:  No abnormal free fluid. IMPRESSION: 1. Normal expected sonographic appearance of the postpartum uterus and endometrium. Although predominantly a clinical diagnosis, no gas, free fluid, or other sonographic features to suggest acute endometritis identified. 2. Otherwise negative pelvic ultrasound. No other acute abnormality identified. Electronically Signed   By: Rise MuBenjamin  McClintock M.D.   On: 02/07/2019 02:16   Dg Chest Portable 1 View  Result Date: 02/06/2019 CLINICAL DATA:  Fever. Sepsis workup. Recent postpartum. EXAM: PORTABLE CHEST 1 VIEW COMPARISON:  04/29/2018 FINDINGS: The cardiomediastinal contours are normal. Possible small pleural effusions. Pulmonary vasculature is normal. No consolidation or pneumothorax. No acute osseous abnormalities are seen. IMPRESSION: Possible small pleural effusions. No  consolidation to suggest pneumonia. Electronically Signed   By: Narda RutherfordMelanie  Sanford M.D.   On: 02/06/2019 23:53     Assessment   38 y.o. [id number] Hospital Day: 4   Plan   1. Fever of unknown origin:   -Patient with normal exam. Uterus non-tender.  -Blood cultures with no growth at 2 days. Urine culture pending. Endometrium with moderate WBCs and no organisms on gram stain, rare E Coli on culture.  -On ampicillin  2g q6h IV x 54 hours, clindamycin 900mb q8h IV x 66 hours, and gentamycin 5mg /kg q 24h x 56 hours.  -Lovenox 1mg /kg BID started just under 24 hours ago for possible septic throbophlebitis.  -Tmax today 100.3 at 1516. Tmax 02/08/2019 102.3 at 1929.  -Continue antibiotics and Lovenox.  -If patient spikes another fever, plan to consult Infectious Diseases for their input.  -Repeat CBC in the morning.  -Possible discharge planning when 48 hours afebrile.   2. Postpartum: -Baby is formula feeding. Patient reports that breast discomfort is improved.  -Lochia WNL -Patient upset at having to continue to be separated from baby, older children, and husband. Emotional support provided. Reviewed importance of treating source of infection adequately to prevent further issues, of which she is understanding.   Discussed with Dr. Feliberto GottronSchermerhorn, who is in agreement with the above plan.    Genia DelMargaret Alexyss Balzarini 02/09/2019 7:49 PM

## 2019-02-09 NOTE — Progress Notes (Signed)
Obstetric and Gynecology  POD/HD # 3  Subjective  Patient doing well, no complaints, tolerating PO intake, tolerating pain with PO meds, ambulating without difficulty, voiding spontaneously.     Denies CP, SOB,  N/V/D, or leg pain.  Is on Amp/Gent/Clinda for fever of unknown origin.  So far cultures are pending, including endometrium - rare GNR. Tmax @ 1929 = 102.3 , pvs Tmax @ 1312 yesterday, 103.1.  She has zero complaints and desperately wants to go home.   Objective  Objective:   Patient Vitals for the past 24 hrs:  BP Temp Temp src Pulse Resp SpO2  02/09/19 0020 (!) 100/57 - - 63 18 100 %  02/08/19 2356 (!) 102/44 98.4 F (36.9 C) Oral 77 18 100 %  02/08/19 2100 - (!) 100.7 F (38.2 C) Oral - - -  02/08/19 1950 - - - (!) 102 - -  02/08/19 1929 126/63 (!) 102.3 F (39.1 C) Oral (!) 106 18 99 %  02/08/19 1910 122/71 (!) 101 F (38.3 C) Oral (!) 111 18 98 %  02/08/19 1635 (!) 107/52 98.6 F (37 C) - 78 20 100 %  02/08/19 1152 (!) 99/59 98 F (36.7 C) - 66 18 98 %  02/08/19 0807 99/64 98.3 F (36.8 C) Oral 74 20 97 %  02/08/19 0418 104/60 97.6 F (36.4 C) Oral 69 18 94 %  02/08/19 0217 - 98.8 F (37.1 C) Oral - - -    General: NAD Cardiovascular: RRR, no murmurs Pulmonary: CTAB, normal respiratory effort Abdomen: Benign. Non-tender, +BS, no guarding. Extremities: No erythema or cords, no calf tenderness, with normal peripheral pulses.  Labs: Results for orders placed or performed during the hospital encounter of 02/06/19 (from the past 24 hour(s))  CBC with Differential/Platelet     Status: Abnormal   Collection Time: 02/08/19  5:18 AM  Result Value Ref Range   WBC 12.4 (H) 4.0 - 10.5 K/uL   RBC 3.59 (L) 3.87 - 5.11 MIL/uL   Hemoglobin 10.3 (L) 12.0 - 15.0 g/dL   HCT 29.531.9 (L) 62.136.0 - 30.846.0 %   MCV 88.9 80.0 - 100.0 fL   MCH 28.7 26.0 - 34.0 pg   MCHC 32.3 30.0 - 36.0 g/dL   RDW 65.714.1 84.611.5 - 96.215.5 %   Platelets 160 150 - 400 K/uL   nRBC 0.0 0.0 - 0.2 %   Neutrophils Relative % 81 %   Neutro Abs 10.0 (H) 1.7 - 7.7 K/uL   Lymphocytes Relative 11 %   Lymphs Abs 1.4 0.7 - 4.0 K/uL   Monocytes Relative 6 %   Monocytes Absolute 0.8 0.1 - 1.0 K/uL   Eosinophils Relative 1 %   Eosinophils Absolute 0.1 0.0 - 0.5 K/uL   Basophils Relative 0 %   Basophils Absolute 0.0 0.0 - 0.1 K/uL   Immature Granulocytes 1 %   Abs Immature Granulocytes 0.13 (H) 0.00 - 0.07 K/uL  Lactic acid, plasma     Status: None   Collection Time: 02/08/19  5:18 AM  Result Value Ref Range   Lactic Acid, Venous 0.8 0.5 - 1.9 mmol/L  Comprehensive metabolic panel     Status: Abnormal   Collection Time: 02/08/19  5:18 AM  Result Value Ref Range   Sodium 138 135 - 145 mmol/L   Potassium 3.9 3.5 - 5.1 mmol/L   Chloride 108 98 - 111 mmol/L   CO2 24 22 - 32 mmol/L   Glucose, Bld 106 (H) 70 - 99 mg/dL  BUN 8 6 - 20 mg/dL   Creatinine, Ser 1.77 0.44 - 1.00 mg/dL   Calcium 8.2 (L) 8.9 - 10.3 mg/dL   Total Protein 5.3 (L) 6.5 - 8.1 g/dL   Albumin 2.3 (L) 3.5 - 5.0 g/dL   AST 14 (L) 15 - 41 U/L   ALT 20 0 - 44 U/L   Alkaline Phosphatase 74 38 - 126 U/L   Total Bilirubin 0.2 (L) 0.3 - 1.2 mg/dL   GFR calc non Af Amer >60 >60 mL/min   GFR calc Af Amer >60 >60 mL/min   Anion gap 6 5 - 15    Cultures: Results for orders placed or performed during the hospital encounter of 02/06/19  SARS Coronavirus 2 [facility] order, Performed in [facility] hospital lab) Nasopharyngeal Nasopharyngeal Swab     Status: None   Collection Time: 02/06/19 11:10 PM   Specimen: Nasopharyngeal Swab  Result Value Ref Range Status   SARS Coronavirus 2 NEGATIVE NEGATIVE Final    Comment: (NOTE) If result is NEGATIVE SARS-CoV-2 target nucleic acids are NOT DETECTED. The SARS-CoV-2 RNA is generally detectable in upper and lower  respiratory specimens during the acute phase of infection. The lowest  concentration of SARS-CoV-2 viral copies this assay can detect is 250  copies / mL. A negative result  does not preclude SARS-CoV-2 infection  and should not be used as the sole basis for treatment or other  patient management decisions.  A negative result may occur with  improper specimen collection / handling, submission of specimen other  than nasopharyngeal swab, presence of viral mutation(s) within the  areas targeted by this assay, and inadequate number of viral copies  (<250 copies / mL). A negative result must be combined with clinical  observations, patient history, and epidemiological information. If result is POSITIVE SARS-CoV-2 target nucleic acids are DETECTED. The SARS-CoV-2 RNA is generally detectable in upper and lower  respiratory specimens dur ing the acute phase of infection.  Positive  results are indicative of active infection with SARS-CoV-2.  Clinical  correlation with patient history and other diagnostic information is  necessary to determine patient infection status.  Positive results do  not rule out bacterial infection or co-infection with other viruses. If result is PRESUMPTIVE POSTIVE SARS-CoV-2 nucleic acids MAY BE PRESENT.   A presumptive positive result was obtained on the submitted specimen  and confirmed on repeat testing.  While 2019 novel coronavirus  (SARS-CoV-2) nucleic acids may be present in the submitted sample  additional confirmatory testing may be necessary for epidemiological  and / or clinical management purposes  to differentiate between  SARS-CoV-2 and other Sarbecovirus currently known to infect humans.  If clinically indicated additional testing with an alternate test  methodology [phone]) is advised. The SARS-CoV-2 RNA is generally  detectable in upper and lower respiratory sp ecimens during the acute  phase of infection. The expected result is Negative. Fact Sheet for Patients:  BoilerBrush.com.cy Fact Sheet for Healthcare Providers: https://pope.com/ This test is not yet approved or  cleared by the [place] FDA and has been authorized for detection and/or diagnosis of SARS-CoV-2 by FDA under an Emergency Use Authorization (EUA).  This EUA will remain in effect (meaning this test can be used) for the duration of the COVID-19 declaration under Section 564(b)(1) of the Act, 21 U.S.C. section 360bbb-3(b)(1), unless the authorization is terminated or revoked sooner. Performed at [facility], [address] [place], [place] [address]   Blood culture (routine x 2)  Status: None (Preliminary result)   Collection Time: 02/06/19 11:18 PM   Specimen: BLOOD  Result Value Ref Range Status   Specimen Description BLOOD L WRIST  Final   Special Requests   Final    BOTTLES DRAWN AEROBIC AND ANAEROBIC Blood Culture results may not be optimal due to an excessive volume of blood received in culture bottles   Culture   Final    NO GROWTH 1 DAY Performed at [facility], [address] [place], [place] [address]    Report Status PENDING  Incomplete  Blood culture (routine x 2)     Status: None (Preliminary result)   Collection Time: 02/06/19 11:18 PM   Specimen: BLOOD  Result Value Ref Range Status   Specimen Description BLOOD RT FOREARM  Final   Special Requests   Final    BOTTLES DRAWN AEROBIC AND ANAEROBIC Blood Culture results may not be optimal due to an excessive volume of blood received in culture bottles   Culture   Final    NO GROWTH 1 DAY Performed at [facility], [address] [place], [place] [address]    Report Status PENDING  Incomplete  Aerobic/Anaerobic Culture (surgical/deep wound)     Status: None (Preliminary result)   Collection Time: 02/07/19  1:44 PM   Specimen: Endometrium  Result Value Ref Range Status   Specimen Description   Final    ENDOMETRIUM Performed at [facility], [address] [place], [place] [address]    Special Requests   Final    NONE Performed at [facility], [address] [place], [place] [address]    Gram Stain   Final    MODERATE WBC SEEN NO ORGANISMS SEEN Performed at [facility], [address] [place], [place] [address]    Culture   Final    RARE Romie MinusGRAM NEGATIVE RODS CULTURE REINCUBATED FOR BETTER GROWTH Performed at [facility], [address] [place], [place] [address]    Report Status PENDING  Incomplete    Imaging: Koreas Pelvis (transabdominal Only)  Result Date: 02/07/2019 CLINICAL DATA:  Initial evaluation for acute fever, status post recent vaginal delivery on 02/04/2019. EXAM: TRANSABDOMINAL ULTRASOUND OF PELVIS TECHNIQUE: Transabdominal ultrasound examination of the pelvis was performed including evaluation of the uterus, ovaries, adnexal regions, and pelvic cul-de-sac. COMPARISON:  None. FINDINGS: Uterus Measurements: 16.7 x 10.0 x 13.2 cm = volume: 1145.9 mL. Normal expected globular appearance of the uterus given postpartum status. No discrete fibroid or other mass. Endometrium Thickness: Up to 10.7 mm. No focal abnormality visualized. No increased vascularity or significant hyperemia to suggest endometritis. Right ovary Measurements: 3.1 x 2.1 x 2.7 cm = volume: 9.2 mL. Normal appearance/no adnexal mass. Left ovary Measurements: 3.6 x 1.8 x 3.3 cm = volume: 11.1 mL. Normal appearance/no adnexal mass. Other findings:  No abnormal free fluid. IMPRESSION: 1. Normal expected sonographic appearance of the postpartum uterus and endometrium. Although predominantly a clinical diagnosis, no gas, free fluid, or other sonographic features to suggest acute endometritis identified. 2. Otherwise negative pelvic ultrasound. No other acute abnormality identified. Electronically Signed   By: Rise MuBenjamin  McClintock M.D.   On: 02/07/2019 02:16   Dg Chest Portable 1 View  Result Date: 02/06/2019 CLINICAL DATA:  Fever. Sepsis workup. Recent postpartum. EXAM: PORTABLE CHEST 1 VIEW COMPARISON:  04/29/2018 FINDINGS: The cardiomediastinal  contours are normal. Possible small pleural effusions. Pulmonary vasculature is normal. No consolidation or pneumothorax. No acute osseous abnormalities are seen.  IMPRESSION: Possible small pleural effusions. No consolidation to suggest pneumonia. Electronically Signed   By: Keith Rake M.D.   On: 02/06/2019 23:53     Assessment   39 y.o. [id number] Hospital Day: 3   Plan   1. Fever of unknown origin:  Uterus is non-tender.  Blood and urine cultures negative so far, and endometrium shows rare GNR.  On abx to treat GNR; continue.  Have to consider STP, and will start lovenox 1mg /kg BID just in case.  2. Discharge planning:  Patient is not thrilled about the idea of staying.  Have to figure out if she needs more abx, and if so should we consider a picc and home health?  She seems she would rather take the risk of being home with her newborn and other children. If so would want to consider broad spectrum abx administered daily.   ----- Larey Days, MD, [place] Attending Obstetrician and Gynecologist [facility], Department of [facility]

## 2019-02-09 NOTE — Plan of Care (Signed)
Temp max 102.3 orally; controlled with po tylenol and po motrin; 3 IV antibiotics continued; IV fluids continued; good appetite; good po fluids; voiding.

## 2019-02-10 LAB — CBC
HCT: 32.9 % — ABNORMAL LOW (ref 36.0–46.0)
Hemoglobin: 10.8 g/dL — ABNORMAL LOW (ref 12.0–15.0)
MCH: 28.9 pg (ref 26.0–34.0)
MCHC: 32.8 g/dL (ref 30.0–36.0)
MCV: 88 fL (ref 80.0–100.0)
Platelets: 203 10*3/uL (ref 150–400)
RBC: 3.74 MIL/uL — ABNORMAL LOW (ref 3.87–5.11)
RDW: 13.9 % (ref 11.5–15.5)
WBC: 7.5 10*3/uL (ref 4.0–10.5)
nRBC: 0 % (ref 0.0–0.2)

## 2019-02-10 LAB — URINE CULTURE

## 2019-02-10 MED ORDER — SODIUM CHLORIDE 0.9 % IV SOLN
INTRAVENOUS | Status: DC | PRN
  Administered 2019-02-10: 500 mL via INTRAVENOUS

## 2019-02-10 MED ORDER — AMOXICILLIN-POT CLAVULANATE 875-125 MG PO TABS: 1.0000 | ORAL_TABLET | Freq: Two times a day (BID) | ORAL | 1 refills | Status: DC

## 2019-02-10 NOTE — Discharge Summary (Signed)
Subjective: The patient is doing well.  No nausea or vomiting. No further chills  Hospital course:  Admitted with high fevers to 103, relapsing and remitting. Started on triple abx, lovenox. Fevers resolved, now 48hrs afebrile.  Endometrial cultures grew out rare E. Coli Urine culture skin flora growth Blood culture no growth x4 days Covid neg   Objective: Vital signs in last 24 hours: Temp:  [98.2 F (36.8 C)-99.6 F (37.6 C)] 98.4 F (36.9 C) (09/04 1556) Pulse Rate:  [59-72] 59 (09/04 1556) Resp:  [18-20] 18 (09/04 1556) BP: (108-133)/(65-81) 110/65 (09/04 1556) SpO2:  [98 %-100 %] 100 % (09/04 1556)  Intake/Output  Intake/Output Summary (Last 24 hours) at 02/10/2019 1644 Last data filed at 02/10/2019 0937 Gross per 24 hour  Intake 2636.24 ml  Output -  Net 2636.24 ml    Physical Exam:  General: Alert and oriented. CV: RRR Lungs: Clear bilaterally. GI: Soft, Nondistended. Incisions: Clean and dry. Urine: Clear, adequate Extremities: Nontender, no erythema, no edema.  Lab Results: Recent Labs    02/08/19 0518 02/09/19 0803 02/10/19 0915  HGB 10.3* 10.2* 10.8*  HCT 31.9* 31.6* 32.9*  WBC 12.4* 9.2 7.5  PLT 160 174 203                 Results for orders placed or performed during the hospital encounter of 02/06/19 (from the past 24 hour(s))  CBC     Status: Abnormal   Collection Time: 02/10/19  9:15 AM  Result Value Ref Range   WBC 7.5 4.0 - 10.5 K/uL   RBC 3.74 (L) 3.87 - 5.11 MIL/uL   Hemoglobin 10.8 (L) 12.0 - 15.0 g/dL   HCT 32.9 (L) 36.0 - 46.0 %   MCV 88.0 80.0 - 100.0 fL   MCH 28.9 26.0 - 34.0 pg   MCHC 32.8 30.0 - 36.0 g/dL   RDW 13.9 11.5 - 15.5 %   Platelets 203 150 - 400 K/uL   nRBC 0.0 0.0 - 0.2 %    Assessment/Plan:  - discharge with augmentin - will not send with lovenox as now 48hrs afebrile - return to clinic in 2 weeks, and sooner if needed Precautions for infection given.   Benjaman Kindler, MD   LOS: 3 days   Benjaman Kindler 02/10/2019, 4:44 PM

## 2019-02-10 NOTE — Progress Notes (Signed)
Discharge instructions provided.  Pt verbalizes understanding of all instructions and follow-up care.  Pt discharged to home at 1723 on 02/10/19 via wheelchair by NT. Reed Breech, RN 02/10/2019 5:59 PM

## 2019-02-10 NOTE — Discharge Instructions (Signed)
Call your doctor for increased pain or vaginal bleeding, temperature above 100.4, depression, or concerns.  No strenuous activity or heavy lifting for 6 weeks.  No intercourse, tampons, or douching for 6 weeks.  No tub baths- showers only.  No driving for 2 weeks or while taking pain medications.

## 2019-02-12 LAB — CULTURE, BLOOD (ROUTINE X 2)
Culture: NO GROWTH
Culture: NO GROWTH

## 2019-02-12 LAB — AEROBIC/ANAEROBIC CULTURE W GRAM STAIN (SURGICAL/DEEP WOUND)

## 2019-02-15 NOTE — Progress Notes (Signed)
Pharmacy - Antimicrobial Stewardship   Patient discharged on 9/4 with 10d prescription for amoxicillin/clavulonate.  Patient's endometrial culture finalized 9/6 with E. Coli that was resistant to ampicillin, TMP/SMZ, and intermediate susceptibility to ampicillin/sulbactam.    Called Dr. Migdalia Dk office and discussed with CNM the above info.  I suggested possibility of changing to cephalexin +/- metronidazole for anaerobic coverage.  Doreene Eland, PharmD, BCPS.   Work Cell: [phone] 02/15/2019 10:59 AM

## 2019-03-09 DIAGNOSIS — U071 COVID-19: Secondary | ICD-10-CM

## 2019-03-09 HISTORY — DX: COVID-19: U07.1

## 2019-03-20 DIAGNOSIS — O24414 Gestational diabetes mellitus in pregnancy, insulin controlled: Secondary | ICD-10-CM | POA: Diagnosis not present

## 2019-09-07 DEATH — deceased

## 2019-12-21 DIAGNOSIS — R112 Nausea with vomiting, unspecified: Secondary | ICD-10-CM | POA: Diagnosis not present

## 2019-12-21 DIAGNOSIS — R5381 Other malaise: Secondary | ICD-10-CM | POA: Diagnosis not present

## 2019-12-21 DIAGNOSIS — R5383 Other fatigue: Secondary | ICD-10-CM | POA: Diagnosis not present

## 2019-12-21 DIAGNOSIS — J0140 Acute pansinusitis, unspecified: Secondary | ICD-10-CM | POA: Diagnosis not present

## 2019-12-21 DIAGNOSIS — R05 Cough: Secondary | ICD-10-CM | POA: Diagnosis not present

## 2020-03-06 ENCOUNTER — Encounter: Payer: Self-pay | Admitting: Family Medicine

## 2020-03-06 ENCOUNTER — Ambulatory Visit (INDEPENDENT_AMBULATORY_CARE_PROVIDER_SITE_OTHER): Payer: BC Managed Care – PPO | Admitting: Family Medicine

## 2020-03-06 ENCOUNTER — Other Ambulatory Visit: Payer: Self-pay

## 2020-03-06 VITALS — BP 144/90 | HR 82 | Temp 98.2°F | Ht 63.5 in | Wt 178.8 lb

## 2020-03-06 DIAGNOSIS — R1033 Periumbilical pain: Secondary | ICD-10-CM | POA: Diagnosis not present

## 2020-03-06 DIAGNOSIS — I152 Hypertension secondary to endocrine disorders: Secondary | ICD-10-CM | POA: Insufficient documentation

## 2020-03-06 DIAGNOSIS — Z8632 Personal history of gestational diabetes: Secondary | ICD-10-CM

## 2020-03-06 DIAGNOSIS — I10 Essential (primary) hypertension: Secondary | ICD-10-CM | POA: Diagnosis not present

## 2020-03-06 LAB — CBC
HCT: 40.5 % (ref 36.0–46.0)
Hemoglobin: 13.4 g/dL (ref 12.0–15.0)
MCHC: 33.1 g/dL (ref 30.0–36.0)
MCV: 92.4 fl (ref 78.0–100.0)
Platelets: 210 10*3/uL (ref 150.0–400.0)
RBC: 4.39 Mil/uL (ref 3.87–5.11)
RDW: 13.5 % (ref 11.5–15.5)
WBC: 8.3 10*3/uL (ref 4.0–10.5)

## 2020-03-06 LAB — COMPREHENSIVE METABOLIC PANEL
ALT: 21 U/L (ref 0–35)
AST: 26 U/L (ref 0–37)
Albumin: 4.3 g/dL (ref 3.5–5.2)
Alkaline Phosphatase: 66 U/L (ref 39–117)
BUN: 13 mg/dL (ref 6–23)
CO2: 28 mEq/L (ref 19–32)
Calcium: 9.3 mg/dL (ref 8.4–10.5)
Chloride: 100 mEq/L (ref 96–112)
Creatinine, Ser: 0.65 mg/dL (ref 0.40–1.20)
GFR: 101.8 mL/min (ref >60.00)
Glucose, Bld: 128 mg/dL — ABNORMAL HIGH (ref 70–99)
Potassium: 3.6 mEq/L (ref 3.5–5.1)
Sodium: 138 mEq/L (ref 135–145)
Total Bilirubin: 0.6 mg/dL (ref 0.2–1.2)
Total Protein: 7.2 g/dL (ref 6.0–8.3)

## 2020-03-06 LAB — LIPID PANEL
Cholesterol: 162 mg/dL (ref 0–200)
HDL: 41.1 mg/dL (ref >39.00)
NonHDL: 120.47
Total CHOL/HDL Ratio: 4
Triglycerides: 363 mg/dL — ABNORMAL HIGH (ref 0.0–149.0)
VLDL: 72.6 mg/dL — ABNORMAL HIGH (ref 0.0–40.0)

## 2020-03-06 LAB — LDL CHOLESTEROL, DIRECT: Direct LDL: 70 mg/dL

## 2020-03-06 LAB — HEMOGLOBIN A1C: Hgb A1c MFr Bld: 6.8 % — ABNORMAL HIGH (ref 4.6–6.5)

## 2020-03-06 LAB — TSH: TSH: 3.79 u[IU]/mL (ref 0.35–4.50)

## 2020-03-06 MED ORDER — LISINOPRIL 10 MG PO TABS: 10.0000 mg | ORAL_TABLET | Freq: Every day | ORAL | 3 refills | Status: DC

## 2020-03-06 NOTE — Progress Notes (Signed)
Subjective:     Amber Foster is a 39 y.o. female presenting for Establish Care, Hypertension (x 2 months ), Headache, Numbness (R arm x 2 months ), Dizziness (x 2 months ), Palpitations (x 2 months ), and Hernia (has knot in center of abdomen. with pressure causes nausea )     HPI  #HTN - no hx of HTN in pregnancy - has been checking at home - endorsing numbness, HA, dizziness - BP at home - will check first thing in the morning -  8/24:  157/93 - typically at home 150-170/90s  #Abdominal pain - getting nausea - has a knot in the abdomen  - Jan 2020 - felt sick and asked to have it checked - was also pregnant at that time - no pain today - will get nausea and pain if she presses on the the area - now having a hard time pushing it and will not go in  Review of Systems   Social History   Tobacco Use  Smoking Status Never Smoker  Smokeless Tobacco Never Used        Objective:    BP Readings from Last 3 Encounters:  03/06/20 (!) 144/90  02/10/19 110/65  02/05/19 92/70   Wt Readings from Last 3 Encounters:  03/06/20 178 lb 12 oz (81.1 kg)  02/06/19 170 lb (77.1 kg)  02/03/19 180 lb (81.6 kg)    BP (!) 144/90   Pulse 82   Temp 98.2 F (36.8 C) (Temporal)   Ht 5' 3.5" (1.613 m)   Wt 178 lb 12 oz (81.1 kg)   SpO2 97%   Breastfeeding No   BMI 31.17 kg/m    Physical Exam Constitutional:      General: She is not in acute distress.    Appearance: She is well-developed. She is not diaphoretic.  HENT:     Head: Normocephalic and atraumatic.     Right Ear: External ear normal.     Left Ear: External ear normal.     Nose: Nose normal.  Eyes:     Conjunctiva/sclera: Conjunctivae normal.  Cardiovascular:     Rate and Rhythm: Normal rate and regular rhythm.     Heart sounds: No murmur heard.   Pulmonary:     Effort: Pulmonary effort is normal. No respiratory distress.     Breath sounds: Normal breath sounds. No wheezing.  Abdominal:     General:  Abdomen is flat. Bowel sounds are normal. There is no distension.     Palpations: Abdomen is soft.     Tenderness: There is abdominal tenderness in the periumbilical area. There is no guarding or rebound.     Comments: With abdominal crunch does have some central bulge but unable to palpate discrete defect.   Musculoskeletal:     Cervical back: Neck supple.  Skin:    General: Skin is warm and dry.     Capillary Refill: Capillary refill takes less than 2 seconds.  Neurological:     Mental Status: She is alert. Mental status is at baseline.  Psychiatric:        Mood and Affect: Mood normal.        Behavior: Behavior normal.           Assessment & Plan:   Problem List Items Addressed This Visit      Cardiovascular and Mediastinum   Essential hypertension - Primary    BP elevated. Suspect this may be contributing to some of her symptoms (HA/dizziness)  given high numbers at home will start treatment and get blood work. Return in 1 month for check. Of note on OCPs - may also discuss stopping this at future visit is remaining elevated and alternative medication      Relevant Medications   lisinopril (ZESTRIL) 10 MG tablet   Other Relevant Orders   Comprehensive metabolic panel   CBC   TSH   Lipid panel     Other   History of gestational diabetes    Given new elevated bp will check hgb a1c to screen for diabetes      Relevant Orders   Hemoglobin A1c   Periumbilical abdominal pain    Hx is concerning for hernia. Suspect she may have diastasis recti based on exam and wonder about hernia but unable to assess. Given pain and symptoms will do CT to assess for hernia. If becoming firm again she will call and switch to stat ct      Relevant Orders   Comprehensive metabolic panel   CBC   CT Abdomen Pelvis W Contrast     Other concerns - numbness in the arm - will address at future visit   Return in about 4 weeks (around 04/03/2020).  Lynnda Child, MD  This visit  occurred during the SARS-CoV-2 public health emergency.  Safety protocols were in place, including screening questions prior to the visit, additional usage of staff PPE, and extensive cleaning of exam room while observing appropriate contact time as indicated for disinfecting solutions.

## 2020-03-06 NOTE — Assessment & Plan Note (Signed)
Hx is concerning for hernia. Suspect she may have diastasis recti based on exam and wonder about hernia but unable to assess. Given pain and symptoms will do CT to assess for hernia. If becoming firm again she will call and switch to stat ct

## 2020-03-06 NOTE — Assessment & Plan Note (Signed)
BP elevated. Suspect this may be contributing to some of her symptoms (HA/dizziness) given high numbers at home will start treatment and get blood work. Return in 1 month for check. Of note on OCPs - may also discuss stopping this at future visit is remaining elevated and alternative medication

## 2020-03-06 NOTE — Assessment & Plan Note (Signed)
Given new elevated bp will check hgb a1c to screen for diabetes

## 2020-03-06 NOTE — Patient Instructions (Signed)
Labs today Start lisinopril Continue to monitor blood pressure    Your blood pressure high.   High blood pressure increases your risk for heart attack and stroke.    Please check your blood pressure 2-4 times a week.   To check your blood pressure 1) Sit in a quiet and relaxed place for 5 minutes 2) Make sure your feet are flat on the ground 3) Consider checking first thing in the morning   Normal blood pressure is less than 140/90 Ideally you blood pressure should be around 120/80  Other ways you can reduce your blood pressure:  1) Regular exercise -- Try to get 150 minutes (30 minutes, 5 days a week) of moderate to vigorous aerobic excercise -- Examples: brisk walking (2.5 miles per hour), water aerobics, dancing, gardening, tennis, biking slower than 10 miles per hour 2) DASH Diet - low fat meats, more fresh fruits and vegetables, whole grains, low salt 3) Quit smoking if you smoke 4) Loose 5-10% of your body weight

## 2020-03-08 ENCOUNTER — Telehealth: Payer: Self-pay

## 2020-03-08 DIAGNOSIS — R0683 Snoring: Secondary | ICD-10-CM

## 2020-03-08 NOTE — Telephone Encounter (Signed)
Patient contacted the office and states she feels very nauseated after she takes her Lisinopril. She states this is the second day that she has taken this, and she gets so nauseous that she is unable to eat. She wants to know if this is normal when first starting this medication, and if this sx will subside overtime?  Patient also states that she would like a referral to pulmonology. She states that her husband mentioned to her the other day that sometimes when she is sleeping, her breathing gets very shallow and her lips almost seem to turn purple. She states her husband also states that when this happens, it is very hard for him to get her awake, and he has to roughly shake her to get her to wake up. She is wondering if she could have a referral to pulmonary for a sleep study? Please advise.

## 2020-03-11 ENCOUNTER — Telehealth: Payer: Self-pay | Admitting: Family Medicine

## 2020-03-11 DIAGNOSIS — I10 Essential (primary) hypertension: Secondary | ICD-10-CM

## 2020-03-11 MED ORDER — LOSARTAN POTASSIUM 25 MG PO TABS: 25.0000 mg | ORAL_TABLET | Freq: Every day | ORAL | 1 refills | Status: DC

## 2020-03-11 NOTE — Telephone Encounter (Signed)
Please call patient and advise her to stop lisinopril and start losartan - sent to pharmacy.   Also verify that she is able to eat and drink other things w/o nausea.

## 2020-03-11 NOTE — Telephone Encounter (Signed)
Please call patient to check in on nausea.   Nausea is not a common side effect - did it get better? Is she still taking the medication?   If persisting - would recommend changing medicine.   Referral to pulmonology placed

## 2020-03-11 NOTE — Telephone Encounter (Signed)
Please see phone note response on latest 03/11/20 call.

## 2020-03-11 NOTE — Telephone Encounter (Signed)
Left VM (DPR) for pt advising her to stop lisinopril and start the losartan that was sent in, per Dr. Selena Batten.   Advised her that a referral was sent into pulmonology as she requested.  Asked pt to please call back and let us know if she is able to eat and drink other things w/o nausea.

## 2020-03-11 NOTE — Telephone Encounter (Signed)
Pt returned call

## 2020-03-11 NOTE — Telephone Encounter (Signed)
Patient called in stating she is having issues keeping down the lisinopril. States she cannot keep down any food with it. Wondering if dose is too high or if she needs to change completely. Please advise.

## 2020-03-12 ENCOUNTER — Encounter: Payer: Self-pay | Admitting: Family Medicine

## 2020-03-13 ENCOUNTER — Ambulatory Visit (INDEPENDENT_AMBULATORY_CARE_PROVIDER_SITE_OTHER)
Admission: RE | Admit: 2020-03-13 | Discharge: 2020-03-13 | Disposition: A | Discharge status: Home / Self Care | Payer: BC Managed Care – PPO | Source: Ambulatory Visit | Attending: Family Medicine | Admitting: Family Medicine

## 2020-03-13 ENCOUNTER — Other Ambulatory Visit: Payer: Self-pay

## 2020-03-13 DIAGNOSIS — R1033 Periumbilical pain: Secondary | ICD-10-CM | POA: Diagnosis not present

## 2020-03-13 DIAGNOSIS — R109 Unspecified abdominal pain: Secondary | ICD-10-CM | POA: Diagnosis not present

## 2020-03-13 DIAGNOSIS — R1110 Vomiting, unspecified: Secondary | ICD-10-CM | POA: Diagnosis not present

## 2020-03-13 DIAGNOSIS — K760 Fatty (change of) liver, not elsewhere classified: Secondary | ICD-10-CM | POA: Diagnosis not present

## 2020-03-13 MED ORDER — IOHEXOL 300 MG/ML  SOLN
100.0000 mL | Freq: Once | INTRAMUSCULAR | Status: AC | PRN
  Administered 2020-03-13: 100 mL via INTRAVENOUS

## 2020-03-13 NOTE — Telephone Encounter (Signed)
Spoke to pt and pt let me know that she stopped taking the lisinopril 2 days ago as instructed but has not started the losartan yet.  Pt states she is still vomiting after eating and/or drinking. She vomited today.  Pt is going for her CT this afternoon.

## 2020-03-14 ENCOUNTER — Encounter: Payer: Self-pay | Admitting: Family Medicine

## 2020-03-14 DIAGNOSIS — K429 Umbilical hernia without obstruction or gangrene: Secondary | ICD-10-CM

## 2020-03-14 DIAGNOSIS — K760 Fatty (change of) liver, not elsewhere classified: Secondary | ICD-10-CM | POA: Insufficient documentation

## 2020-03-14 NOTE — Addendum Note (Signed)
Addended by: Lynnda Child on: 03/14/2020 03:14 PM   Modules accepted: Orders

## 2020-03-26 ENCOUNTER — Other Ambulatory Visit: Payer: Self-pay

## 2020-03-26 ENCOUNTER — Ambulatory Visit (INDEPENDENT_AMBULATORY_CARE_PROVIDER_SITE_OTHER): Payer: BC Managed Care – PPO | Admitting: Surgery

## 2020-03-26 ENCOUNTER — Telehealth: Payer: Self-pay | Admitting: Surgery

## 2020-03-26 ENCOUNTER — Encounter: Payer: Self-pay | Admitting: Surgery

## 2020-03-26 ENCOUNTER — Ambulatory Visit: Payer: Self-pay | Admitting: Surgery

## 2020-03-26 VITALS — BP 146/89 | HR 62 | Temp 98.3°F | Ht 64.0 in | Wt 177.4 lb

## 2020-03-26 DIAGNOSIS — K439 Ventral hernia without obstruction or gangrene: Secondary | ICD-10-CM

## 2020-03-26 DIAGNOSIS — M6208 Separation of muscle (nontraumatic), other site: Secondary | ICD-10-CM | POA: Diagnosis not present

## 2020-03-26 NOTE — Patient Instructions (Addendum)
Our surgery scheduler Britta Mccreedy will contact you within the next 24-48 hours to discuss surgery and preparation prior to surgery. Please have the BLUE sheet available when she contacts you, she would discuss the different dates and times that work with your schedule. If you have any questions or concerns, please do not hesitate to give our office a call.  Ventral Hernia  A ventral hernia is a bulge of tissue from inside the abdomen that pushes through a weak area of the muscles that form the front wall of the abdomen. The tissues inside the abdomen are inside a sac (peritoneum). These tissues include the small intestine, large intestine, and the fatty tissue that covers the intestines (omentum). Sometimes, the bulge that forms a hernia contains intestines. Other hernias contain only fat. Ventral hernias do not go away without surgical treatment. There are several types of ventral hernias. You may have:  A hernia at an incision site from previous abdominal surgery (incisional hernia).  A hernia just above the belly button (epigastric hernia), or at the belly button (umbilical hernia). These types of hernias can develop from heavy lifting or straining.  A hernia that comes and goes (reducible hernia). It may be visible only when you lift or strain. This type of hernia can be pushed back into the abdomen (reduced).  A hernia that traps abdominal tissue inside the hernia (incarcerated hernia). This type of hernia does not reduce.  A hernia that cuts off blood flow to the tissues inside the hernia (strangulated hernia). The tissues can start to die if this happens. This is a very painful bulge that cannot be reduced. A strangulated hernia is a medical emergency. What are the causes? This condition is caused by abdominal tissue putting pressure on an area of weakness in the abdominal muscles. What increases the risk? The following factors may make you more likely to develop this condition:  Being  female.  Being 76 or older.  Being overweight or obese.  Having had previous abdominal surgery, especially if there was an infection after surgery.  Having had an injury to the abdominal wall.  Having had several pregnancies.  Having a buildup of fluid inside the abdomen (ascites). What are the signs or symptoms? The only symptom of a ventral hernia may be a painless bulge in the abdomen. A reducible hernia may be visible only when you strain, cough, or lift. Other symptoms may include:  Dull pain.  A feeling of pressure. Signs and symptoms of a strangulated hernia may include:  Increasing pain.  Nausea and vomiting.  Pain when pressing on the hernia.  The skin over the hernia turning red or purple.  Constipation.  Blood in the stool (feces). How is this diagnosed? This condition may be diagnosed based on:  Your symptoms.  Your medical history.  A physical exam. You may be asked to cough or strain while standing. These actions increase the pressure inside your abdomen and force the hernia through the opening in your muscles. Your health care provider may try to reduce the hernia by pressing on it.  Imaging studies, such as an ultrasound or CT scan. How is this treated? This condition is treated with surgery. If you have a strangulated hernia, surgery is done as soon as possible. If your hernia is small and not incarcerated, you may be asked to lose some weight before surgery. Follow these instructions at home:  Follow instructions from your health care provider about eating or drinking restrictions.  If you are  overweight, your health care provider may recommend that you increase your activity level and eat a healthier diet.  Do not lift anything that is heavier than 10 lb (4.5 kg).  Return to your normal activities as told by your health care provider. Ask your health care provider what activities are safe for you. You may need to avoid activities that increase  pressure on your hernia.  Take over-the-counter and prescription medicines only as told by your health care provider.  Keep all follow-up visits as told by your health care provider. This is important. Contact a health care provider if:  Your hernia gets larger.  Your hernia becomes painful. Get help right away if:  Your hernia becomes increasingly painful.  You have pain along with any of the following: ? Changes in skin color in the area of the hernia. ? Nausea. ? Vomiting. ? Fever. Summary  A ventral hernia is a bulge of tissue from inside the abdomen that pushes through a weak area of the muscles that form the front wall of the abdomen.  This condition is treated with surgery, which may be urgent depending on your hernia.  Do not lift anything that is heavier than 10 lb (4.5 kg), and follow activity instructions from your health care provider. This information is not intended to replace advice given to you by your health care provider. Make sure you discuss any questions you have with your health care provider. Document Revised: 07/07/2017 Document Reviewed: 12/14/2016 Elsevier Patient Education  2020 [employer] Inc[employer]  Diastasis Recti  Diastasis recti is when the muscles of the abdomen (rectus abdominis muscles) become thin and separate. The result is a wider space between the right and left abdomen (abdominal) muscles. This wider space between the muscles may cause a bulge in the middle of your abdomen. You may notice this bulge when you are straining or when you sit up from a lying down position. Diastasis recti can affect men and women. It is most common among pregnant women, infants, people who are obese, and people who have had abdominal surgery. Exercise or surgical treatment may help correct it. What are the causes? Common causes of this condition include:  Pregnancy. The growing uterus puts pressure on the abdominal muscles, which causes the muscles to  separate.  Obesity. Excess fat puts pressure on abdominal muscles.  Weightlifting.  Some abdomen exercises.  Advanced age.  Genetics.  Prior abdominal surgery. What increases the risk? This condition is more likely to develop in:  Women.  Newborns, especially newborns who are born early (prematurely). What are the signs or symptoms? Common symptoms of this condition include:  A bulge in the middle of the abdomen. You will notice it most when you sit up or strain.  Pain in the low back, pelvis, or hips.  Constipation.  Inability to control when you urinate (urinary incontinence).  Bloating.  Poor posture. How is this diagnosed? This condition is diagnosed with a physical exam. Your health care provider will ask you to lie flat on your back and do a crunch or half sit-up. If you have diastasis recti, a vertical bulge will appear between your abdominal muscles in the center of your abdomen. Your health care provider will measure the gap between your muscles with one of the following:  A medical device used to measure the space between two objects (caliper).  A tape measure.  CT scan.  Ultrasound.  Finger spaces. Your health care provider will measure the space using their fingers.  How is this treated? If your muscle separation is not too large, you may not need treatment. However, if you are a woman who plans to become pregnant again, you should treat this condition before your next pregnancy. Treatment may include:  Physical therapy to strengthen and tighten your abdominal muscles.  Lifestyle changes such as weight loss and exercise.  Over-the-counter pain medicines as needed.  Surgery to correct the separation. Follow these instructions at home: Activity  Return to your normal activities as told by your health care provider. Ask your health care provider what activities are safe for you.  When lifting weights or doing exercises using your abdominal muscles  or the muscles in the center of your body that give stability (core muscles), make sure you are doing your exercises and movements correctly. Proper form can help to prevent the condition from happening again. General instructions  If you are overweight, ask your health care provider for help with weight loss. Losing even a small amount of weight can help to improve your diastasis recti.  Take over-the-counter or prescription medicines only as told by your health care provider.  Do not strain. Straining can make the separation worse. Examples of straining include: ? Pushing hard to have a bowel movement, such as due to constipation. ? Lifting heavy objects, including children. ? Standing up and sitting down.  Take steps to prevent constipation: ? Drink enough fluid to keep your urine clear or pale yellow. ? Take over-the-counter or prescription medicines only as directed. ? Eat foods that are high in fiber, such as fresh fruits and vegetables, whole grains, and beans. ? Limit foods that are high in fat and processed sugars, such as fried and sweet foods. Contact a health care provider if:  You notice a new bulge in your abdomen. Get help right away if:  You experience severe discomfort in your abdomen.  You develop severe abdominal pain along with nausea, vomiting, or fever. Summary  Diastasis recti is when the abdomen (abdominal) muscles become thin and separate. Your abdomen will stick out because the space between your right and left abdomen muscles has widened.  The most common symptom is a bulge in your abdomen. You will notice it most when you sit up or are straining.  This condition is diagnosed during a physical exam.  If the abdomen separation is not too big, you may choose not to have treatment. Otherwise, you may need to undergo physical therapy or surgery. This information is not intended to replace advice given to you by your health care provider. Make sure you discuss  any questions you have with your health care provider. Document Revised: 05/07/2017 Document Reviewed: 07/20/2016 Elsevier Patient Education  2020 [employer] Inc.   Laparoscopic Ventral Hernia Repair Laparoscopic ventral hernia repairis a procedure to fix a bulge of tissue that pushes through a weak area of muscle in the abdomen (ventral hernia). A ventral hernia may be at the belly button (umbilical), above the belly button (epigastric), or at the incision site from previous abdominal surgery (incisional hernia). You may have this procedure as emergency surgery if part of your intestine gets trapped inside the hernia and starts to lose its blood supply (strangulation). Laparoscopic surgery is done through small incisions using a thin surgical telescope with a light and camera on the end (laparoscope). During surgery, your surgeon will use images from the laparoscope to guide the procedure. A mesh screen will be placed in the hernia to close the opening and strengthen  the abdominal wall. Tell a health care provider about:  Any allergies you have.  All medicines you are taking, including vitamins, herbs, eye drops, creams, and over-the-counter medicines.  Any problems you or family members have had with anesthetic medicines.  Any blood disorders you have.  Any surgeries you have had.  Any medical conditions you have.  Whether you are pregnant or may be pregnant. What are the risks? Generally, this is a safe procedure. However, problems may occur, including:  Infection.  Bleeding.  Allergic reactions to medicines.  Damage to other structures or organs in the abdomen.  Trouble urinating or having a bowel movement after surgery.  Pneumonia.  Blood clots.  The hernia coming back after surgery.  Fluid buildup in the area of the hernia. In some cases, your health care provider may need to switch from a laparoscopic procedure to a procedure that is done through a single, larger  incision in the abdomen (open procedure). You may need an open procedure if:  You have a hernia that is difficult to repair.  Your organs are hard to see.  You have bleeding problems during the laparoscopic procedure. What happens before the procedure? Staying hydrated Follow instructions from your health care provider about hydration, which may include:  Up to 2 hours before the procedure - you may continue to drink clear liquids, such as water, clear fruit juice, black coffee, and plain tea. Eating and drinking restrictions Follow instructions from your health care provider about eating and drinking, which may include:  8 hours before the procedure - stop eating heavy meals or foods such as meat, fried foods, or fatty foods.  6 hours before the procedure - stop eating light meals or foods, such as toast or cereal.  6 hours before the procedure - stop drinking milk or drinks that contain milk.  2 hours before the procedure - stop drinking clear liquids. Medicines  Ask your health care provider about: ? Changing or stopping your regular medicines. This is especially important if you are taking diabetes medicines or blood thinners. ? Taking medicines such as aspirin and ibuprofen. These medicines can thin your blood. Do not take these medicines before your procedure if your health care provider instructs you not to.  You may be given antibiotic medicine to help prevent infection. General instructions   You may be asked to take a laxative or do an enema to empty your bowel before surgery (bowel prep).  Do not use any products that contain nicotine or tobacco, such as cigarettes and e-cigarettes. If you need help quitting, ask your health care provider.  You may need to have tests before the procedure, such as: ? Blood tests. ? Urine tests. ? Abdominal ultrasound. ? Chest X-ray. ? Electrocardiogram (ECG).  Plan to have someone take you home from the hospital or clinic.  If  you will be going home right after the procedure, plan to have someone with you for 24 hours. What happens during the procedure?  To reduce your risk of infection: ? Your health care team will wash or sanitize their hands. ? Your skin will be washed with soap.  An IV tube will be inserted into one of your veins.  You will be given one or more of the following: ? A medicine to help you relax (sedative). ? A medicine to make you fall asleep (general anesthetic).  A small incision will be made in your abdomen. A hollow metal tube (trocar) will be placed through the  incision.  A tube will be placed through the trocar to inflate your abdomen with air-like gas. This makes it easier for your surgeon to see inside your abdomen and do the repair.  The laparoscope will be inserted into your abdomen through the trocar. The laparoscope will send images to a monitor in the operating room.  Other trocars will be put through other small incisions in your abdomen. The surgical instruments needed for the procedure will be placed through these trocars.  The tissue or intestines that make up the hernia will be moved back into place.  The edges of the hernia may be stitched together.  A piece of mesh will be used to close the hernia. Stitches (sutures), clips, or staples will be used to keep the mesh in place.  A bandage (dressing) or skin glue will be put over the incisions. The procedure may vary among health care providers and hospitals. What happens after the procedure?  Your blood pressure, heart rate, breathing rate, and blood oxygen level will be monitored until the medicines you were given have worn off.  You will continue to receive fluids and medicines through an IV tube. Your IV tube will be removed when you can drink clear fluids.  You will be given pain medicine as needed.  You will be encouraged to get up and walk around as soon as possible.  You may have to wear compression  stockings. These stockings help to prevent blood clots and reduce swelling in your legs.  You will be shown how to do deep breathing exercises to help prevent a lung infection.  Do not drive for 24 hours if you were given a sedative. This information is not intended to replace advice given to you by your health care provider. Make sure you discuss any questions you have with your health care provider. Document Revised: 05/07/2017 Document Reviewed: 01/10/2016 Elsevier Patient Education  2020 [employer]

## 2020-03-26 NOTE — H&P (View-Only) (Signed)
Patient ID: Amber Foster, female   DOB: 1981-04-02, 39 y.o.   MRN: [id number]  Chief Complaint:  Peri-umbilical hernia  History of Present Illness Amber Foster is a 39 y.o. 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  Her last pregnancy was breech and underwent a significant amount of bearing down and manipulative adjustments to reposition the child.  Following that delivery she noted a larger bulge which did not spontaneously resolved.  She has to manipulate or push the hernia back in, she also has a degree of supraumbilical diastases recti associated.  She is most uncomfortable when she standing.  She has had some loose stools but no diarrhea, no constipation.  She reports a history of nausea/vomiting.  She denies any fevers or chills.  Past Medical History Past Medical History:  Diagnosis Date  . Back pain   . Congenital anomaly of spinal meninges (HCC)   . Diabetes mellitus without complication (HCC)   . Gestational diabetes   . Headache(784.0)   . Polycystic ovarian syndrome 10/2014  . UTI (lower urinary tract infection)   . Yeast infection       Past Surgical History:  Procedure Laterality Date  . CHOLECYSTECTOMY    . HYSTEROSCOPY N/A 07/25/2015   Procedure: HYSTEROSCOPY;  Surgeon: Reva Bores, MD;  Location: [facility];  Service: Gynecology;  Laterality: N/A;  . LAPAROSCOPY N/A 07/25/2015   Procedure: LAPAROSCOPY DIAGNOSTIC, POSTERIOR CUL DE SAC BIOPSY;  Surgeon: Reva Bores, MD;  Location: [facility];  Service: Gynecology;  Laterality: N/A;  . TONSILLECTOMY    . WISDOM TOOTH EXTRACTION      Allergies  Allergen Reactions  . Other Other (See Comments)    Pt prefers no liquid medications, causes shaking    Current Outpatient Medications  Medication Sig Dispense Refill  . losartan (COZAAR) 25 MG tablet Take 1 tablet (25 mg total) by mouth daily. 30 tablet 1  . SPRINTEC 28 0.25-35 MG-MCG tablet Take 1 tablet by mouth daily.     No current  facility-administered medications for this visit.    Family History Family History  Problem Relation Age of Onset  . Hypertension Mother   . Cancer Paternal Grandfather        brain tumor  . Seizures Sister   . Bipolar disorder Sister   . Hypertension Maternal Grandmother   . Alzheimer's disease Maternal Grandmother       Social History Social History   Tobacco Use  . Smoking status: Never Smoker  . Smokeless tobacco: Never Used  Vaping Use  . Vaping Use: Never used  Substance Use Topics  . Alcohol use: No  . Drug use: No        Review of Systems  Constitutional: Positive for diaphoresis and malaise/fatigue.  HENT: Negative.   Eyes: Negative.   Respiratory: Positive for shortness of breath.   Cardiovascular: Positive for palpitations. Negative for orthopnea and claudication.  Gastrointestinal: Positive for abdominal pain, diarrhea, nausea and vomiting. Negative for blood in stool.  Genitourinary: Positive for frequency and urgency.  Skin: Negative.   Neurological: Positive for dizziness, tingling and headaches.  Psychiatric/Behavioral: Negative.       Physical Exam Blood pressure (!) 146/89, pulse 62, temperature 98.3 F (36.8 C), temperature source Oral, height 5\' 4"  (1.626 m), weight 177 lb 6.4 oz (80.5 kg), SpO2 98 %, not currently breastfeeding. Last Weight  Most recent update: 03/26/2020 11:03 AM   Weight  80.5 kg (177 lb 6.4 oz)  CONSTITUTIONAL: Well developed, and nourished, appropriately responsive and aware without distress.   EYES: Sclera non-icteric.   EARS, NOSE, MOUTH AND THROAT: Mask worn.    Hearing is intact to voice.  NECK: Trachea is midline, and there is no jugular venous distension.  LYMPH NODES:  Lymph nodes in the neck are not enlarged. RESPIRATORY:  Lungs are clear, and breath sounds are equal bilaterally. Normal respiratory effort without pathologic use of accessory muscles. CARDIOVASCULAR: Heart is regular in rate and  rhythm. GI: The abdomen is in general quite lax, there is a supraumbilical diastases recti, there is a left sided fascial defect in the supraumbilical area, otherwise soft, nontender, and nondistended. There were no palpable masses. I did not appreciate hepatosplenomegaly. There were normal bowel sounds. MUSCULOSKELETAL:  Symmetrical muscle tone appreciated in all four extremities.    SKIN: Skin turgor is normal. No pathologic skin lesions appreciated.  NEUROLOGIC:  Motor and sensation appear grossly normal.  Cranial nerves are grossly without defect. PSYCH:  Alert and oriented to person, place and time. Affect is appropriate for situation.  Data Reviewed I have personally reviewed what is currently available of the patient's imaging, recent labs and medical records.   Labs:  CBC Latest Ref Rng & Units 03/06/2020 02/10/2019 02/09/2019  WBC 4.0 - 10.5 K/uL 8.3 7.5 9.2  Hemoglobin 12.0 - 15.0 g/dL 78.2 10.8(L) 10.2(L)  Hematocrit 36 - 46 % 40.5 32.9(L) 31.6(L)  Platelets 150 - 400 K/uL 210.0 203 174   CMP Latest Ref Rng & Units 03/06/2020 02/08/2019 02/07/2019  Glucose 70 - 99 mg/dL 956(O) 130(Q) 657(Q)  BUN 6 - 23 mg/dL 13 8 8   Creatinine 0.40 - 1.20 mg/dL 4.69 6.29  Sodium 135 - 145 mEq/L 138 138 138  Potassium 3.5 - 5.1 mEq/L 3.6 3.9 3.3(L)  Chloride 96 - 112 mEq/L 100 108 108  CO2 19 - 32 mEq/L 28 24 20(L)  Calcium 8.4 - 10.5 mg/dL 9.3 5.28) 7.9(L)  Total Protein 6.0 - 8.3 g/dL 7.2 5.3(L) 5.6(L)  Total Bilirubin 0.2 - 1.2 mg/dL 0.6 4.1(L) 0.4  Alkaline Phos 39 - 117 U/L 66 74 80  AST 0 - 37 U/L 26 14(L) 24  ALT 0 - 35 U/L 21 20 21       Imaging:  Within last 24 hrs: No results found.  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 Ventral hernia. Patient Active Problem List   Diagnosis Date Noted  . Hepatic steatosis 03/14/2020  . Periumbilical abdominal pain 03/06/2020  . Essential hypertension 03/06/2020  . Abnormal uterine bleeding  08/14/2015  . Dyspareunia in female 08/14/2015  . PCOS (polycystic ovarian syndrome) 05/07/2015  . Adnexal pain 11/08/2014  . History of gestational diabetes 07/28/2013    Plan    Robotic assisted ventral hernia repair with mesh.  Options discussed with patient including more limited direct suture repair. She desires to have a more lasting confidence in her anterior abdominal wall, and is willing to undergo the additional risks involving mesh repair, hopefully with some associated plication of her linea alba. I discussed possibility of incarceration, strangulation, enlargement in size over time, and the risk of emergency surgery in the face of strangulation.  Also discussed the risk of surgery including recurrence which can be up to 30% in the case of complex hernias, use of prosthetic materials (mesh) and the increased risk of infxn, post-op infxn and the possible need for re-operation and removal of mesh if used, possibility of post-op SBO  or ileus, and the risks of general anesthetic including MI, CVA, sudden death or even reaction to anesthetic medications. The patient and family understands the risks, any and all questions were answered to the patient's satisfaction.   Face-to-face time spent with the patient and accompanying care providers(if present) was 30 minutes, with more than 50% of the time spent counseling, educating, and coordinating care of the patient.      [name] M.D., FACS 03/26/2020, 11:27 AM

## 2020-03-26 NOTE — Telephone Encounter (Signed)
Received call back from the patient, she is informed of dates regarding her surgery and voices understanding.

## 2020-03-26 NOTE — Progress Notes (Signed)
Patient ID: Amber Foster, female   DOB: 1981-04-02, 39 y.o.   MRN: [id number]  Chief Complaint:  Peri-umbilical hernia  History of Present Illness Amber Foster is a 39 y.o. 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  Her last pregnancy was breech and underwent a significant amount of bearing down and manipulative adjustments to reposition the child.  Following that delivery she noted a larger bulge which did not spontaneously resolved.  She has to manipulate or push the hernia back in, she also has a degree of supraumbilical diastases recti associated.  She is most uncomfortable when she standing.  She has had some loose stools but no diarrhea, no constipation.  She reports a history of nausea/vomiting.  She denies any fevers or chills.  Past Medical History Past Medical History:  Diagnosis Date  . Back pain   . Congenital anomaly of spinal meninges (HCC)   . Diabetes mellitus without complication (HCC)   . Gestational diabetes   . Headache(784.0)   . Polycystic ovarian syndrome 10/2014  . UTI (lower urinary tract infection)   . Yeast infection       Past Surgical History:  Procedure Laterality Date  . CHOLECYSTECTOMY    . HYSTEROSCOPY N/A 07/25/2015   Procedure: HYSTEROSCOPY;  Surgeon: Reva Bores, MD;  Location: [facility];  Service: Gynecology;  Laterality: N/A;  . LAPAROSCOPY N/A 07/25/2015   Procedure: LAPAROSCOPY DIAGNOSTIC, POSTERIOR CUL DE SAC BIOPSY;  Surgeon: Reva Bores, MD;  Location: [facility];  Service: Gynecology;  Laterality: N/A;  . TONSILLECTOMY    . WISDOM TOOTH EXTRACTION      Allergies  Allergen Reactions  . Other Other (See Comments)    Pt prefers no liquid medications, causes shaking    Current Outpatient Medications  Medication Sig Dispense Refill  . losartan (COZAAR) 25 MG tablet Take 1 tablet (25 mg total) by mouth daily. 30 tablet 1  . SPRINTEC 28 0.25-35 MG-MCG tablet Take 1 tablet by mouth daily.     No current  facility-administered medications for this visit.    Family History Family History  Problem Relation Age of Onset  . Hypertension Mother   . Cancer Paternal Grandfather        brain tumor  . Seizures Sister   . Bipolar disorder Sister   . Hypertension Maternal Grandmother   . Alzheimer's disease Maternal Grandmother       Social History Social History   Tobacco Use  . Smoking status: Never Smoker  . Smokeless tobacco: Never Used  Vaping Use  . Vaping Use: Never used  Substance Use Topics  . Alcohol use: No  . Drug use: No        Review of Systems  Constitutional: Positive for diaphoresis and malaise/fatigue.  HENT: Negative.   Eyes: Negative.   Respiratory: Positive for shortness of breath.   Cardiovascular: Positive for palpitations. Negative for orthopnea and claudication.  Gastrointestinal: Positive for abdominal pain, diarrhea, nausea and vomiting. Negative for blood in stool.  Genitourinary: Positive for frequency and urgency.  Skin: Negative.   Neurological: Positive for dizziness, tingling and headaches.  Psychiatric/Behavioral: Negative.       Physical Exam Blood pressure (!) 146/89, pulse 62, temperature 98.3 F (36.8 C), temperature source Oral, height 5\' 4"  (1.626 m), weight 177 lb 6.4 oz (80.5 kg), SpO2 98 %, not currently breastfeeding. Last Weight  Most recent update: 03/26/2020 11:03 AM   Weight  80.5 kg (177 lb 6.4 oz)  CONSTITUTIONAL: Well developed, and nourished, appropriately responsive and aware without distress.   EYES: Sclera non-icteric.   EARS, NOSE, MOUTH AND THROAT: Mask worn.    Hearing is intact to voice.  NECK: Trachea is midline, and there is no jugular venous distension.  LYMPH NODES:  Lymph nodes in the neck are not enlarged. RESPIRATORY:  Lungs are clear, and breath sounds are equal bilaterally. Normal respiratory effort without pathologic use of accessory muscles. CARDIOVASCULAR: Heart is regular in rate and  rhythm. GI: The abdomen is in general quite lax, there is a supraumbilical diastases recti, there is a left sided fascial defect in the supraumbilical area, otherwise soft, nontender, and nondistended. There were no palpable masses. I did not appreciate hepatosplenomegaly. There were normal bowel sounds. MUSCULOSKELETAL:  Symmetrical muscle tone appreciated in all four extremities.    SKIN: Skin turgor is normal. No pathologic skin lesions appreciated.  NEUROLOGIC:  Motor and sensation appear grossly normal.  Cranial nerves are grossly without defect. PSYCH:  Alert and oriented to person, place and time. Affect is appropriate for situation.  Data Reviewed I have personally reviewed what is currently available of the patient's imaging, recent labs and medical records.   Labs:  CBC Latest Ref Rng & Units 03/06/2020 02/10/2019 02/09/2019  WBC 4.0 - 10.5 K/uL 8.3 7.5 9.2  Hemoglobin 12.0 - 15.0 g/dL 78.2 10.8(L) 10.2(L)  Hematocrit 36 - 46 % 40.5 32.9(L) 31.6(L)  Platelets 150 - 400 K/uL 210.0 203 174   CMP Latest Ref Rng & Units 03/06/2020 02/08/2019 02/07/2019  Glucose 70 - 99 mg/dL 956(O) 130(Q) 657(Q)  BUN 6 - 23 mg/dL 13 8 8   Creatinine 0.40 - 1.20 mg/dL 4.69 6.29  Sodium 135 - 145 mEq/L 138 138 138  Potassium 3.5 - 5.1 mEq/L 3.6 3.9 3.3(L)  Chloride 96 - 112 mEq/L 100 108 108  CO2 19 - 32 mEq/L 28 24 20(L)  Calcium 8.4 - 10.5 mg/dL 9.3 5.28) 7.9(L)  Total Protein 6.0 - 8.3 g/dL 7.2 5.3(L) 5.6(L)  Total Bilirubin 0.2 - 1.2 mg/dL 0.6 4.1(L) 0.4  Alkaline Phos 39 - 117 U/L 66 74 80  AST 0 - 37 U/L 26 14(L) 24  ALT 0 - 35 U/L 21 20 21       Imaging:  Within last 24 hrs: No results found.  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 Ventral hernia. Patient Active Problem List   Diagnosis Date Noted  . Hepatic steatosis 03/14/2020  . Periumbilical abdominal pain 03/06/2020  . Essential hypertension 03/06/2020  . Abnormal uterine bleeding  08/14/2015  . Dyspareunia in female 08/14/2015  . PCOS (polycystic ovarian syndrome) 05/07/2015  . Adnexal pain 11/08/2014  . History of gestational diabetes 07/28/2013    Plan    Robotic assisted ventral hernia repair with mesh.  Options discussed with patient including more limited direct suture repair. She desires to have a more lasting confidence in her anterior abdominal wall, and is willing to undergo the additional risks involving mesh repair, hopefully with some associated plication of her linea alba. I discussed possibility of incarceration, strangulation, enlargement in size over time, and the risk of emergency surgery in the face of strangulation.  Also discussed the risk of surgery including recurrence which can be up to 30% in the case of complex hernias, use of prosthetic materials (mesh) and the increased risk of infxn, post-op infxn and the possible need for re-operation and removal of mesh if used, possibility of post-op SBO  or ileus, and the risks of general anesthetic including MI, CVA, sudden death or even reaction to anesthetic medications. The patient and family understands the risks, any and all questions were answered to the patient's satisfaction.   Face-to-face time spent with the patient and accompanying care providers(if present) was 30 minutes, with more than 50% of the time spent counseling, educating, and coordinating care of the patient.      [name] M.D., FACS 03/26/2020, 11:27 AM

## 2020-03-26 NOTE — Telephone Encounter (Signed)
Outgoing call is made, left message for patient to call.  Please advise patient of Pre-Admission date/time, COVID Testing date and Surgery date.  Surgery Date: 04/10/20 Preadmission Testing Date: 04/01/20 (phone 8a-1p) Covid Testing Date: 04/08/20 - patient advised to go to [facility] ([address] [facility]) between 8a-1p   Also patient needs to call [phone], between 1-3:00pm the day before surgery, to find out what time to arrive for surgery.

## 2020-04-01 ENCOUNTER — Other Ambulatory Visit: Payer: Self-pay

## 2020-04-01 ENCOUNTER — Encounter
Admission: RE | Admit: 2020-04-01 | Discharge: 2020-04-01 | Disposition: A | Discharge status: Home / Self Care | Payer: BC Managed Care – PPO | Source: Ambulatory Visit | Attending: Surgery | Admitting: Surgery

## 2020-04-01 DIAGNOSIS — Z01812 Encounter for preprocedural laboratory examination: Secondary | ICD-10-CM | POA: Insufficient documentation

## 2020-04-01 HISTORY — DX: Other intervertebral disc degeneration, lumbar region: M51.36

## 2020-04-01 HISTORY — DX: Other intervertebral disc displacement, lumbar region: M51.26

## 2020-04-01 HISTORY — DX: Essential (primary) hypertension: I10

## 2020-04-01 HISTORY — DX: Personal history of urinary calculi: Z87.442

## 2020-04-01 HISTORY — DX: Other intervertebral disc degeneration, lumbar region without mention of lumbar back pain or lower extremity pain: M51.369

## 2020-04-01 NOTE — Pre-Procedure Instructions (Addendum)
Patient stated during pre op interview for her upcoming surgery that she does not want to receive blood from anyone who has been vaccinated.  I explained that in an emergency, they provide her with the blood that works best for her situation and that I could not guarantee nor confirm if that was the case for blood.  Patient also informed me that when she is sleeping, her husband states that her breathing gets really shallow and sometimes her lips turn a bluish-purple and she is very difficult to arouse at that time. She is scheduled for a sleep study in December 2021.

## 2020-04-01 NOTE — Patient Instructions (Signed)
INSTRUCTIONS FOR SURGERY     Your surgery is scheduled for:   Wednesday, November 3RD     To find out your arrival time for the day of surgery,          please call [phone] between 1 pm and 3 pm on :  Tuesday, November 2ND     When you arrive for surgery, report to the [facility].       Do NOT stop on the first floor to register.    REMEMBER: Instructions that are not followed completely may result in serious medical risk,  up to and including death, or upon the discretion of your surgeon and anesthesiologist,            your surgery may need to be rescheduled.  __X__ 1. Do not eat food after midnight the night before your procedure.                    No gum, candy, lozenger, tic tacs, tums or hard candies.                  ABSOLUTELY NOTHING SOLID IN YOUR MOUTH AFTER MIDNIGHT                    You may drink unlimited clear liquids up to 2 hours before you are scheduled to arrive for surgery.                   Do not drink anything within those 2 hours unless you need to take medicine, then take the                   smallest amount you need.  Clear liquids include:  water, apple juice without pulp,                   any flavor Gatorade, Black coffee, black tea.  Sugar may be added but no dairy/ honey /lemon.                        Broth and jello is not considered a clear liquid.  __x__  2. On the morning of surgery, please brush your teeth with toothpaste and water. You may rinse with                  mouthwash if you wish but DO NOT SWALLOW TOOTHPASTE OR MOUTHWASH  __X___3. NO alcohol for 24 hours before or after surgery.  __x___ 4.  Do NOT smoke or use e-cigarettes for 24 HOURS PRIOR TO SURGERY.                      DO NOT Use any chewable tobacco products for at least 6 hours prior to surgery.  __x___ 5. If you start any new medication after this appointment and prior to surgery, please                    Bring it with you on the day of surgery.  ___x__ 6. Notify your doctor if there is any change in  your medical condition, such as fever,                  infection, vomitting, diarrhea or any open sores.  __x___ 7.  USE the CHG SOAP as instructed, the night before surgery and the day of surgery.                   Once you have washed with this soap, do NOT use any of the following: Powders, perfumes                    or lotions. Please do not wear make up, hairpins, clips or nail polish. You MAY wear deodorant.                   Men may shave their face and neck.  Women need to shave 48 hours prior to surgery.                   DO NOT wear ANY jewelry on the day of surgery. If there are rings that are too tight to                    remove easily, please address this prior to the surgery day. Piercings need to be removed.                                                                     NO METAL ON YOUR BODY.                    Do NOT bring any valuables.  If you came to Pre-Admit testing then you will not need license,                     insurance card or credit card.  If you will be staying overnight, please either leave your things in                     the car or have your family be responsible for these items.                     [facility] IS NOT RESPONSIBLE FOR BELONGINGS OR VALUABLES.  ___X__ 8. DO NOT wear contact lenses on surgery day.  You may not have dentures,                     Hearing aides, contacts or glasses in the operating room. These items can be                    Placed in the Recovery Room to receive immediately after surgery.  __x___ 9. IF YOU ARE SCHEDULED TO GO HOME ON THE SAME DAY, YOU MUST                   Have someone to drive you home and to stay with you  for the first 24 hours.                    Have an arrangement prior to arriving on surgery day.  ___x__ 10. Take the following medications on the  morning of surgery with a sip of water:                               1. SPRINTEC                     2.                     3.  _____ 11.  Follow any instructions provided to you by your surgeon.                        Such as enema, clear liquid bowel prep  __X__  12. STOP ALL ASPIRIN PRODUCTS AS OF 04/01/20                       THIS INCLUDES BC POWDERS / GOODIES POWDER  __x___ 13. STOP Anti-inflammatories as of 04/01/20.                      This includes IBUPROFEN / MOTRIN / ADVIL / ALEVE/ NAPROXYN                    YOU MAY TAKE TYLENOL ANY TIME PRIOR TO SURGERY.  ___x__ 14.  Stop supplements until after surgery.                     This includes: BLACK ELDERBERRY WITH C/D/ZINC                 You may continue taking Vitamin B12 / Vitamin D3 but do not take on the morning of surgery.  __X____17.  Continue to take the following medications but do not take on the morning of surgery:                         LOSARTAN  ______18. Wear clean and comfortable clothing to the hospital. HAVE A LOOSE FITTING WAISTBAND.

## 2020-04-02 ENCOUNTER — Other Ambulatory Visit: Payer: BC Managed Care – PPO | Attending: Surgery

## 2020-04-03 ENCOUNTER — Encounter
Admission: RE | Admit: 2020-04-03 | Discharge: 2020-04-03 | Disposition: A | Discharge status: Home / Self Care | Payer: BC Managed Care – PPO | Source: Ambulatory Visit | Attending: Surgery | Admitting: Surgery

## 2020-04-03 ENCOUNTER — Other Ambulatory Visit: Payer: Self-pay

## 2020-04-03 DIAGNOSIS — I10 Essential (primary) hypertension: Secondary | ICD-10-CM | POA: Diagnosis not present

## 2020-04-03 DIAGNOSIS — Z01818 Encounter for other preprocedural examination: Secondary | ICD-10-CM | POA: Diagnosis not present

## 2020-04-06 ENCOUNTER — Other Ambulatory Visit: Payer: Self-pay | Admitting: Family Medicine

## 2020-04-06 DIAGNOSIS — I10 Essential (primary) hypertension: Secondary | ICD-10-CM

## 2020-04-08 ENCOUNTER — Other Ambulatory Visit
Admission: RE | Admit: 2020-04-08 | Discharge: 2020-04-08 | Disposition: A | Discharge status: Home / Self Care | Payer: BC Managed Care – PPO | Source: Ambulatory Visit | Attending: Surgery | Admitting: Surgery

## 2020-04-08 ENCOUNTER — Other Ambulatory Visit: Payer: Self-pay

## 2020-04-08 ENCOUNTER — Ambulatory Visit (INDEPENDENT_AMBULATORY_CARE_PROVIDER_SITE_OTHER): Payer: BC Managed Care – PPO | Admitting: Family Medicine

## 2020-04-08 VITALS — BP 118/80 | HR 67 | Temp 97.6°F | Wt 178.8 lb

## 2020-04-08 DIAGNOSIS — E118 Type 2 diabetes mellitus with unspecified complications: Secondary | ICD-10-CM

## 2020-04-08 DIAGNOSIS — I10 Essential (primary) hypertension: Secondary | ICD-10-CM | POA: Diagnosis not present

## 2020-04-08 DIAGNOSIS — R200 Anesthesia of skin: Secondary | ICD-10-CM | POA: Diagnosis not present

## 2020-04-08 DIAGNOSIS — Z20822 Contact with and (suspected) exposure to covid-19: Secondary | ICD-10-CM | POA: Diagnosis not present

## 2020-04-08 DIAGNOSIS — R202 Paresthesia of skin: Secondary | ICD-10-CM | POA: Diagnosis not present

## 2020-04-08 DIAGNOSIS — E1129 Type 2 diabetes mellitus with other diabetic kidney complication: Secondary | ICD-10-CM | POA: Insufficient documentation

## 2020-04-08 DIAGNOSIS — Z01812 Encounter for preprocedural laboratory examination: Secondary | ICD-10-CM | POA: Diagnosis not present

## 2020-04-08 LAB — BASIC METABOLIC PANEL
BUN: 12 mg/dL (ref 6–23)
CO2: 26 mEq/L (ref 19–32)
Calcium: 9.2 mg/dL (ref 8.4–10.5)
Chloride: 102 mEq/L (ref 96–112)
Creatinine, Ser: 0.81 mg/dL (ref 0.40–1.20)
GFR: 92.05 mL/min (ref >60.00)
Glucose, Bld: 208 mg/dL — ABNORMAL HIGH (ref 70–99)
Potassium: 3.9 mEq/L (ref 3.5–5.1)
Sodium: 137 mEq/L (ref 135–145)

## 2020-04-08 LAB — SARS CORONAVIRUS 2 (TAT 6-24 HRS): SARS Coronavirus 2: NEGATIVE

## 2020-04-08 MED ORDER — LOSARTAN POTASSIUM 25 MG PO TABS: 25.0000 mg | ORAL_TABLET | Freq: Every day | ORAL | 3 refills | Status: DC

## 2020-04-08 NOTE — Progress Notes (Signed)
Subjective:     Amber Foster is a 39 y.o. female presenting for Follow-up (hypertension )     HPI   #Arm numbness - still present in the right arm - numbness in the fingertips mostly - on the right side will get numbness in the entire arm - pain starts in the right bicep area - and the whole arm will get numb - worse at night when she sleeps - would get numbness in her legs as a teenager - got a flu shot in 2012 and has pain and numbness in the arm at the injection site since then - told by another doctor that it was possible a nerve was damaged - multiple times a week - gets better on its own - will wake up overnight due to poor sleep and will get symptoms - cannot throw with that arm - activity will trigger her symptoms - is right hand dominate - worse with using poor mechanics to pick something  Does get neck pain and told it was associated with her back pain   Did physical therapy for back in the past and "could not walk" after hre    #HTN - taking losartan - doing well - no cp, sob, HA   Review of Systems   Social History   Tobacco Use  Smoking Status Never Smoker  Smokeless Tobacco Never Used        Objective:    BP Readings from Last 3 Encounters:  04/08/20 118/80  03/26/20 (!) 146/89  03/06/20 (!) 144/90   Wt Readings from Last 3 Encounters:  04/08/20 178 lb 12 oz (81.1 kg)  04/01/20 178 lb (80.7 kg)  03/26/20 177 lb 6.4 oz (80.5 kg)    BP 118/80   Pulse 67   Temp 97.6 F (36.4 C) (Temporal)   Wt 178 lb 12 oz (81.1 kg)   SpO2 97%   BMI 30.68 kg/m    Physical Exam Constitutional:      General: She is not in acute distress.    Appearance: She is well-developed. She is not diaphoretic.  HENT:     Right Ear: External ear normal.     Left Ear: External ear normal.     Nose: Nose normal.  Eyes:     Conjunctiva/sclera: Conjunctivae normal.  Neck:     Comments: Negative spurling Cardiovascular:     Rate and Rhythm: Normal rate.    Pulmonary:     Effort: Pulmonary effort is normal.  Musculoskeletal:     Cervical back: Normal range of motion and neck supple. No rigidity or tenderness.     Comments: Right shoulder Inspection: no abnormality Palpation: TTP on the posterior scapula, and the mid upper arm ROM: normal, but with pain/tingling with lifting the shoulder above 90 degrees Strength: normal Neg Neers Elbow: neg tinel's, normal pronation/supination Wrist: unable to lift arm for phallen, but neg tinel and normal wrist strength rom w/o symptoms  Skin:    General: Skin is warm and dry.     Capillary Refill: Capillary refill takes less than 2 seconds.  Neurological:     Mental Status: She is alert. Mental status is at baseline.  Psychiatric:        Mood and Affect: Mood normal.        Behavior: Behavior normal.           Assessment & Plan:   Problem List Items Addressed This Visit      Cardiovascular and Mediastinum  Essential hypertension    BP controlled. Cont losartan 25 mg. Labs today      Relevant Medications   losartan (COZAAR) 25 MG tablet   Other Relevant Orders   Basic metabolic panel     Endocrine   Controlled diabetes mellitus type 2 with complications (HCC)    Complicated by HTN. Working on diet/exercise. Return for recheck in 3-4 months.       Relevant Medications   losartan (COZAAR) 25 MG tablet     Other   Numbness and tingling of right arm - Primary    Etiology unclear. Wonder about mid arm pain as referred - movement of the shoulder triggers symptoms but no clear weakness identified. Neck exam reassuring but still wonder if symptoms are coming from the neck. Advised PT evaluation and treatment - she is getting surgery soon so will defer currently. She will mychart if wanting to try PT and if no improvement f/u with me or Dr. Patsy Lager to consider imaging.           Return in about 4 months (around 08/06/2020) for annual physical .  Amber Child, MD  This visit  occurred during the SARS-CoV-2 public health emergency.  Safety protocols were in place, including screening questions prior to the visit, additional usage of staff PPE, and extensive cleaning of exam room while observing appropriate contact time as indicated for disinfecting solutions.

## 2020-04-08 NOTE — Assessment & Plan Note (Signed)
Complicated by HTN. Working on diet/exercise. Return for recheck in 3-4 months.

## 2020-04-08 NOTE — Assessment & Plan Note (Signed)
Etiology unclear. Wonder about mid arm pain as referred - movement of the shoulder triggers symptoms but no clear weakness identified. Neck exam reassuring but still wonder if symptoms are coming from the neck. Advised PT evaluation and treatment - she is getting surgery soon so will defer currently. She will mychart if wanting to try PT and if no improvement f/u with me or Dr. Patsy Lager to consider imaging.

## 2020-04-08 NOTE — Assessment & Plan Note (Signed)
BP controlled. Cont losartan 25 mg. Labs today

## 2020-04-08 NOTE — Patient Instructions (Addendum)
Send a MyChart message if you decide you want to try physical therapy

## 2020-04-09 MED ORDER — CHLORHEXIDINE GLUCONATE 0.12 % MT SOLN: 15.0000 mL | Freq: Once | OROMUCOSAL | Status: AC

## 2020-04-09 MED ORDER — GABAPENTIN 300 MG PO CAPS: 300.0000 mg | ORAL_CAPSULE | ORAL | Status: AC

## 2020-04-09 MED ORDER — CEFAZOLIN SODIUM-DEXTROSE 2-4 GM/100ML-% IV SOLN
2.0000 g | INTRAVENOUS | Status: AC
  Administered 2020-04-10: 2 g via INTRAVENOUS

## 2020-04-09 MED ORDER — ORAL CARE MOUTH RINSE: 15.0000 mL | Freq: Once | OROMUCOSAL | Status: AC

## 2020-04-09 MED ORDER — BUPIVACAINE LIPOSOME 1.3 % IJ SUSP: 20.0000 mL | Freq: Once | INTRAMUSCULAR | Status: DC

## 2020-04-09 MED ORDER — CELECOXIB 200 MG PO CAPS: 200.0000 mg | ORAL_CAPSULE | ORAL | Status: AC

## 2020-04-09 MED ORDER — ACETAMINOPHEN 500 MG PO TABS: 1000.0000 mg | ORAL_TABLET | ORAL | Status: AC

## 2020-04-09 MED ORDER — FAMOTIDINE 20 MG PO TABS: 20.0000 mg | ORAL_TABLET | Freq: Once | ORAL | Status: AC

## 2020-04-09 MED ORDER — SODIUM CHLORIDE 0.9 % IV SOLN: INTRAVENOUS | Status: DC

## 2020-04-10 ENCOUNTER — Encounter
Admission: RE | Disposition: A | Discharge status: Home / Self Care | Payer: Self-pay | Source: Home / Self Care | Attending: Surgery

## 2020-04-10 ENCOUNTER — Ambulatory Visit
Admission: RE | Admit: 2020-04-10 | Discharge: 2020-04-10 | Disposition: A | Discharge status: Home / Self Care | Payer: BC Managed Care – PPO | Attending: Surgery | Admitting: Surgery

## 2020-04-10 ENCOUNTER — Other Ambulatory Visit: Payer: Self-pay

## 2020-04-10 ENCOUNTER — Ambulatory Visit: Payer: BC Managed Care – PPO | Admitting: Urgent Care

## 2020-04-10 ENCOUNTER — Encounter: Payer: Self-pay | Admitting: Surgery

## 2020-04-10 DIAGNOSIS — M6208 Separation of muscle (nontraumatic), other site: Secondary | ICD-10-CM | POA: Diagnosis not present

## 2020-04-10 DIAGNOSIS — K439 Ventral hernia without obstruction or gangrene: Secondary | ICD-10-CM | POA: Diagnosis not present

## 2020-04-10 DIAGNOSIS — Q7959 Other congenital malformations of abdominal wall: Secondary | ICD-10-CM | POA: Diagnosis not present

## 2020-04-10 HISTORY — PX: XI ROBOTIC ASSISTED VENTRAL HERNIA: SHX6789

## 2020-04-10 LAB — GLUCOSE, CAPILLARY
Glucose-Capillary: 140 mg/dL — ABNORMAL HIGH (ref 70–99)
Glucose-Capillary: 184 mg/dL — ABNORMAL HIGH (ref 70–99)

## 2020-04-10 LAB — POCT PREGNANCY, URINE: Preg Test, Ur: NEGATIVE

## 2020-04-10 SURGERY — REPAIR, HERNIA, VENTRAL, ROBOT-ASSISTED
Anesthesia: General | Site: Abdomen

## 2020-04-10 MED ORDER — PHENYLEPHRINE HCL (PRESSORS) 10 MG/ML IV SOLN
INTRAVENOUS | Status: DC | PRN
  Administered 2020-04-10: 50 ug via INTRAVENOUS

## 2020-04-10 MED ORDER — LACTATED RINGERS IV SOLN: INTRAVENOUS | Status: DC | PRN

## 2020-04-10 MED ORDER — FAMOTIDINE 20 MG PO TABS
ORAL_TABLET | ORAL | Status: AC
  Administered 2020-04-10: 20 mg via ORAL
  Filled 2020-04-10: qty 1

## 2020-04-10 MED ORDER — ONDANSETRON HCL 4 MG/2ML IJ SOLN
INTRAMUSCULAR | Status: AC
  Filled 2020-04-10: qty 2

## 2020-04-10 MED ORDER — SUGAMMADEX SODIUM 200 MG/2ML IV SOLN
INTRAVENOUS | Status: DC | PRN
  Administered 2020-04-10 (×2): 200 mg via INTRAVENOUS

## 2020-04-10 MED ORDER — CELECOXIB 200 MG PO CAPS
ORAL_CAPSULE | ORAL | Status: AC
  Administered 2020-04-10: 200 mg via ORAL
  Filled 2020-04-10: qty 1

## 2020-04-10 MED ORDER — FENTANYL CITRATE (PF) 100 MCG/2ML IJ SOLN
INTRAMUSCULAR | Status: DC | PRN
  Administered 2020-04-10: 50 ug via INTRAVENOUS
  Administered 2020-04-10: 100 ug via INTRAVENOUS

## 2020-04-10 MED ORDER — CHLORHEXIDINE GLUCONATE CLOTH 2 % EX PADS
6.0000 | MEDICATED_PAD | Freq: Once | CUTANEOUS | Status: AC
  Administered 2020-04-10: 6 via TOPICAL

## 2020-04-10 MED ORDER — CEFAZOLIN SODIUM-DEXTROSE 2-4 GM/100ML-% IV SOLN
INTRAVENOUS | Status: AC
  Filled 2020-04-10: qty 100

## 2020-04-10 MED ORDER — FENTANYL CITRATE (PF) 100 MCG/2ML IJ SOLN
INTRAMUSCULAR | Status: AC
  Filled 2020-04-10: qty 2

## 2020-04-10 MED ORDER — BUPIVACAINE-EPINEPHRINE 0.25% -1:200000 IJ SOLN
INTRAMUSCULAR | Status: DC | PRN
  Administered 2020-04-10: 30 mL

## 2020-04-10 MED ORDER — CHLORHEXIDINE GLUCONATE CLOTH 2 % EX PADS: 6.0000 | MEDICATED_PAD | Freq: Once | CUTANEOUS | Status: DC

## 2020-04-10 MED ORDER — ONDANSETRON HCL 4 MG/2ML IJ SOLN
4.0000 mg | Freq: Once | INTRAMUSCULAR | Status: AC | PRN
  Administered 2020-04-10: 4 mg via INTRAVENOUS

## 2020-04-10 MED ORDER — MIDAZOLAM HCL 2 MG/2ML IJ SOLN
INTRAMUSCULAR | Status: DC | PRN
  Administered 2020-04-10: 2 mg via INTRAVENOUS

## 2020-04-10 MED ORDER — PROPOFOL 10 MG/ML IV BOLUS
INTRAVENOUS | Status: DC | PRN
  Administered 2020-04-10: 180 mg via INTRAVENOUS

## 2020-04-10 MED ORDER — ONDANSETRON HCL 4 MG/2ML IJ SOLN
INTRAMUSCULAR | Status: DC | PRN
  Administered 2020-04-10: 4 mg via INTRAVENOUS

## 2020-04-10 MED ORDER — HYDROCODONE-ACETAMINOPHEN 5-325 MG PO TABS: 1.0000 | ORAL_TABLET | Freq: Once | ORAL | Status: AC | PRN

## 2020-04-10 MED ORDER — PROPOFOL 10 MG/ML IV BOLUS
INTRAVENOUS | Status: AC
  Filled 2020-04-10: qty 40

## 2020-04-10 MED ORDER — EPINEPHRINE PF 1 MG/ML IJ SOLN
INTRAMUSCULAR | Status: AC
  Filled 2020-04-10: qty 1

## 2020-04-10 MED ORDER — BUPIVACAINE LIPOSOME 1.3 % IJ SUSP
INTRAMUSCULAR | Status: DC | PRN
  Administered 2020-04-10: 20 mL

## 2020-04-10 MED ORDER — MIDAZOLAM HCL 2 MG/2ML IJ SOLN
INTRAMUSCULAR | Status: AC
  Filled 2020-04-10: qty 2

## 2020-04-10 MED ORDER — FENTANYL CITRATE (PF) 100 MCG/2ML IJ SOLN
25.0000 ug | INTRAMUSCULAR | Status: DC | PRN
  Administered 2020-04-10 (×2): 25 ug via INTRAVENOUS

## 2020-04-10 MED ORDER — ACETAMINOPHEN 500 MG PO TABS
ORAL_TABLET | ORAL | Status: AC
  Administered 2020-04-10: 1000 mg via ORAL
  Filled 2020-04-10: qty 2

## 2020-04-10 MED ORDER — LIDOCAINE HCL (CARDIAC) PF 100 MG/5ML IV SOSY
PREFILLED_SYRINGE | INTRAVENOUS | Status: DC | PRN
  Administered 2020-04-10: 100 mg via INTRAVENOUS

## 2020-04-10 MED ORDER — GABAPENTIN 300 MG PO CAPS
ORAL_CAPSULE | ORAL | Status: AC
  Administered 2020-04-10: 300 mg via ORAL
  Filled 2020-04-10: qty 1

## 2020-04-10 MED ORDER — HYDROCODONE-ACETAMINOPHEN 5-325 MG PO TABS
ORAL_TABLET | ORAL | Status: AC
  Administered 2020-04-10: 1 via ORAL
  Filled 2020-04-10: qty 1

## 2020-04-10 MED ORDER — ROCURONIUM BROMIDE 100 MG/10ML IV SOLN
INTRAVENOUS | Status: DC | PRN
  Administered 2020-04-10: 20 mg via INTRAVENOUS
  Administered 2020-04-10: 10 mg via INTRAVENOUS
  Administered 2020-04-10: 50 mg via INTRAVENOUS
  Administered 2020-04-10: 10 mg via INTRAVENOUS

## 2020-04-10 MED ORDER — HYDROCODONE-ACETAMINOPHEN 5-325 MG PO TABS: 1.0000 | ORAL_TABLET | Freq: Four times a day (QID) | ORAL | 0 refills | Status: DC | PRN

## 2020-04-10 MED ORDER — BUPIVACAINE LIPOSOME 1.3 % IJ SUSP
INTRAMUSCULAR | Status: AC
  Filled 2020-04-10: qty 20

## 2020-04-10 MED ORDER — CHLORHEXIDINE GLUCONATE 0.12 % MT SOLN
OROMUCOSAL | Status: AC
  Administered 2020-04-10: 15 mL via OROMUCOSAL
  Filled 2020-04-10: qty 15

## 2020-04-10 MED ORDER — BUPIVACAINE HCL (PF) 0.5 % IJ SOLN
INTRAMUSCULAR | Status: AC
  Filled 2020-04-10: qty 30

## 2020-04-10 MED ORDER — FENTANYL CITRATE (PF) 100 MCG/2ML IJ SOLN
INTRAMUSCULAR | Status: AC
  Administered 2020-04-10: 25 ug via INTRAVENOUS
  Filled 2020-04-10: qty 2

## 2020-04-10 MED ORDER — DEXAMETHASONE SODIUM PHOSPHATE 10 MG/ML IJ SOLN
INTRAMUSCULAR | Status: DC | PRN
  Administered 2020-04-10: 8 mg via INTRAVENOUS

## 2020-04-10 SURGICAL SUPPLY — 51 items
ADH SKN CLS APL DERMABOND .7 (GAUZE/BANDAGES/DRESSINGS) ×1
APL PRP STRL LF DISP 70% ISPRP (MISCELLANEOUS) ×1
CANISTER SUCT 1200ML W/VALVE (MISCELLANEOUS) ×3 IMPLANT
CHLORAPREP W/TINT 26 (MISCELLANEOUS) ×3 IMPLANT
COVER TIP SHEARS 8 DVNC (MISCELLANEOUS) ×1 IMPLANT
COVER TIP SHEARS 8MM DA VINCI (MISCELLANEOUS) ×3
COVER WAND RF STERILE (DRAPES) ×3 IMPLANT
DECANTER SPIKE VIAL GLASS SM (MISCELLANEOUS) ×3 IMPLANT
DEFOGGER SCOPE WARMER CLEARIFY (MISCELLANEOUS) ×3 IMPLANT
DERMABOND ADVANCED (GAUZE/BANDAGES/DRESSINGS) ×2
DERMABOND ADVANCED .7 DNX12 (GAUZE/BANDAGES/DRESSINGS) ×1 IMPLANT
DEVICE SECURE STRAP 25 ABSORB (INSTRUMENTS) ×3 IMPLANT
DRAPE ARM DVNC X/XI (DISPOSABLE) ×4 IMPLANT
DRAPE COLUMN DVNC XI (DISPOSABLE) ×1 IMPLANT
DRAPE DA VINCI XI ARM (DISPOSABLE) ×12
DRAPE DA VINCI XI COLUMN (DISPOSABLE) ×3
ELECT REM PT RETURN 9FT ADLT (ELECTROSURGICAL) ×3
ELECTRODE REM PT RTRN 9FT ADLT (ELECTROSURGICAL) ×1 IMPLANT
GLOVE ORTHO TXT STRL SZ7.5 (GLOVE) ×9 IMPLANT
GOWN STRL REUS W/ TWL LRG LVL3 (GOWN DISPOSABLE) ×4 IMPLANT
GOWN STRL REUS W/TWL LRG LVL3 (GOWN DISPOSABLE) ×12
GRASPER SUT TROCAR 14GX15 (MISCELLANEOUS) ×2 IMPLANT
IRRIGATION STRYKERFLOW (MISCELLANEOUS) IMPLANT
IRRIGATOR STRYKERFLOW (MISCELLANEOUS)
IV NS 1000ML (IV SOLUTION)
IV NS 1000ML BAXH (IV SOLUTION) IMPLANT
KIT PINK PAD W/HEAD ARE REST (MISCELLANEOUS) ×3
KIT PINK PAD W/HEAD ARM REST (MISCELLANEOUS) ×1 IMPLANT
KIT TURNOVER KIT A (KITS) ×3 IMPLANT
MESH VENTRALIGHT ST 4X6IN (Mesh General) ×2 IMPLANT
NDL INSUFFLATION 14GA 120MM (NEEDLE) ×1 IMPLANT
NEEDLE HYPO 22GX1.5 SAFETY (NEEDLE) ×3 IMPLANT
NEEDLE INSUFFLATION 14GA 120MM (NEEDLE) ×3 IMPLANT
NS IRRIG 500ML POUR BTL (IV SOLUTION) ×3 IMPLANT
PACK LAP CHOLECYSTECTOMY (MISCELLANEOUS) ×3 IMPLANT
SCISSORS METZENBAUM CVD 33 (INSTRUMENTS) ×3 IMPLANT
SEAL CANN UNIV 5-8 DVNC XI (MISCELLANEOUS) ×3 IMPLANT
SEAL XI 5MM-8MM UNIVERSAL (MISCELLANEOUS) ×9
SET TUBE SMOKE EVAC HIGH FLOW (TUBING) ×3 IMPLANT
SOLUTION ELECTROLUBE (MISCELLANEOUS) ×3 IMPLANT
SUT MNCRL 4-0 (SUTURE) ×3
SUT MNCRL 4-0 27XMFL (SUTURE) ×1
SUT STRATAFIX 0 PDS+ CT-2 23 (SUTURE) ×3
SUT VIC AB 2-0 SH 27 (SUTURE) ×3
SUT VIC AB 2-0 SH 27XBRD (SUTURE) IMPLANT
SUT VICRYL 0 AB UR-6 (SUTURE) ×3 IMPLANT
SUT VLOC 90 2/L VL 12 GS22 (SUTURE) ×5 IMPLANT
SUTURE MNCRL 4-0 27XMF (SUTURE) ×1 IMPLANT
SUTURE STRATFX 0 PDS+ CT-2 23 (SUTURE) ×1 IMPLANT
TROCAR XCEL 12X100 BLDLESS (ENDOMECHANICALS) ×3 IMPLANT
TROCAR Z-THREAD FIOS 11X100 BL (TROCAR) ×3 IMPLANT

## 2020-04-10 NOTE — Discharge Instructions (Signed)

## 2020-04-10 NOTE — Anesthesia Procedure Notes (Signed)
Procedure Name: Intubation Date/Time: 04/10/2020 8:47 AM Performed by: Lowry Bowl, CRNA Pre-anesthesia Checklist: Patient identified, Emergency Drugs available, Suction available and Patient being monitored Patient Re-evaluated:Patient Re-evaluated prior to induction Oxygen Delivery Method: Circle system utilized Preoxygenation: Pre-oxygenation with 100% oxygen Induction Type: IV induction and Cricoid Pressure applied Ventilation: Mask ventilation without difficulty Laryngoscope Size: Mac and 3 Grade View: Grade I Tube type: Oral Tube size: 6.5 mm Number of attempts: 1 Airway Equipment and Method: Stylet Placement Confirmation: ETT inserted through vocal cords under direct vision,  positive ETCO2 and breath sounds checked- equal and bilateral Secured at: 21 cm Tube secured with: Tape Dental Injury: Teeth and Oropharynx as per pre-operative assessment

## 2020-04-10 NOTE — Op Note (Addendum)
Robotic assisted laparoscopic ventral hernia Repair IPOM using  Oval ventralight BARD mesh   Pre-operative Diagnosis: ventral hernia   Post-operative Diagnosis: same, umbilical, and epigastric with associated diastasis.    Surgeon:  [name], M.D., FACS   Anesthesia: Gen. with endotracheal tube   Findings: 2 cm umbilical and 3 cm epigastric fascial defects separated by approximately 2 cm  Estimated Blood Loss: 25 cc   Complications: none           Procedure Details  The patient was seen again in the Holding Room. The benefits, complications, treatment options, and expected outcomes were discussed with the patient. The risks of bleeding, infection, recurrence of symptoms, failure to resolve symptoms, bowel injury, mesh placement, mesh infection, any of which could require further surgery were reviewed with the patient. The likelihood of improving the patient's symptoms with return to their baseline status is good.  The patient and/or family concurred with the proposed plan, giving informed consent.  The patient was taken to Operating Room, identified and the procedure verified.  A Time Out was held and the above information confirmed.   Prior to the induction of general anesthesia, antibiotic prophylaxis was administered. VTE prophylaxis was in place. General endotracheal anesthesia was then administered and tolerated well. After the induction, the abdomen was prepped with Chloraprep and draped in the sterile fashion. The patient was positioned in the supine position.  After local infiltration of quarter percent Marcaine with epinephrine, stab incision was made left upper quadrant.  Just below the costal margin approximately midclavicular line the Veress needle is passed with sensation of the layers to penetrate the abdominal wall and into the peritoneum.  Saline drop test is confirmed peritoneal placement.  Insufflation is initiated with carbon dioxide to pressures of 15 mmHg. Marcaine  quarter percent with Exparel was used to inject all the incision sites. We used a left upper quadrant subcostal incision and using a robotic 8.5 mm trocar, it was inserted with pneumoperitoneum obtained.  No hemodynamic compromise, no evidence of adjacent adhesions or injury.   2 additional 8.5 mm ports were placed under direct visualization on the left lateral abdomen.  I visualized the hernia and there was a widemouth umbilical defect, with an epigastric hernia measuring approximately 3 cm.    The robot was brought to the surgical field and docked in the standard fashion.  We made sure that all instrumentation was kept under direct vision at all times and there was no collision between the arms.  I scrubbed out and went to the console. Falciform and adjacent preperitoneal adipose and umbilical ligaments and hernia sac were taken down with electrocautery to allow adequate mesh placement to be secured to the fascial tissue. Confirm and measured that the defect was 3 cm.    Using a 0 V-lock suture we closed the ventral defect primarily in a long axis including the umbilical defect and the epigastric diastases.   At this time I went ahead and inserted the oval ventralight ST mesh and the sutures. The PMI was used to pierce the center, of anticipated placement.  Using a centering suture we were able to bring the mesh against the abdominal wall.  The 10 x 15 cm oval mesh was secured circumferentially with the long axis covering the midline to the abdominal wall using 2-0 V-lock in the standard running fashion. Additional suture was utilized to run a second inner ring of running suture to add additional adherence of the mesh to the anterior abdominal wall.  The mesh appeared well secured against the  abdominal wall. A second look laparoscopy revealed no evidence of intra-abdominal injury.    All the needles were removed under direct visualization.  The instruments were removed and the robot was undocked.  The  laparoscopic ports were removed under direct visualization and the pneumoperitoneum was deflated. Incisions were closed with  4-0 Monocryl, Dermabond was used to coat the skin.  Patient tolerated procedure well and there were no immediate complications. Needle and laparotomy counts were correct    [name] M.D., [facility] 04/10/2020 10:45 AM

## 2020-04-10 NOTE — Transfer of Care (Signed)
Immediate Anesthesia Transfer of Care Note  Patient: Amber Foster  Procedure(s) Performed: XI ROBOTIC ASSISTED VENTRAL HERNIA with mesh (N/A Abdomen)  Patient Location: PACU  Anesthesia Type:General  Level of Consciousness: drowsy and patient cooperative  Airway & Oxygen Therapy: Patient Spontanous Breathing and Patient connected to face mask oxygen  Post-op Assessment: Report given to RN and Post -op Vital signs reviewed and stable  Post vital signs: Reviewed and stable  Last Vitals:  Vitals Value Taken Time  BP 124/76 04/10/20 1051  Temp 37.3 C 04/10/20 1051  Pulse 79 04/10/20 1102  Resp 25 04/10/20 1102  SpO2 95 % 04/10/20 1102  Vitals shown include unvalidated device data.  Last Pain:  Vitals:   04/10/20 1051  TempSrc:   PainSc: 0-No pain         Complications: No complications documented.

## 2020-04-10 NOTE — Anesthesia Preprocedure Evaluation (Addendum)
Anesthesia Evaluation  Patient identified by MRN, date of birth, ID band Patient awake    Reviewed: Allergy & Precautions, NPO status , Patient's Chart, lab work & pertinent test results  History of Anesthesia Complications Negative for: history of anesthetic complications  Airway Mallampati: II  TM Distance: >3 FB Neck ROM: Full    Dental no notable dental hx. (+) Dental Advisory Given   Pulmonary neg pulmonary ROS,    Pulmonary exam normal breath sounds clear to auscultation       Cardiovascular hypertension, negative cardio ROS Normal cardiovascular exam Rhythm:Regular Rate:Normal     Neuro/Psych  Headaches, negative psych ROS   GI/Hepatic negative GI ROS, Neg liver ROS,   Endo/Other  diabetes, Gestationalobesity  Renal/GU negative Renal ROS  negative genitourinary   Musculoskeletal negative musculoskeletal ROS (+)   Abdominal   Peds negative pediatric ROS (+)  Hematology negative hematology ROS (+)   Anesthesia Other Findings   Reproductive/Obstetrics negative OB ROS                             Anesthesia Physical  Anesthesia Plan  ASA: II  Anesthesia Plan: General   Post-op Pain Management:    Induction: Intravenous  PONV Risk Score and Plan:   Airway Management Planned: Oral ETT  Additional Equipment:   Intra-op Plan:   Post-operative Plan: Extubation in OR  Informed Consent: I have reviewed the patients History and Physical, chart, labs and discussed the procedure including the risks, benefits and alternatives for the proposed anesthesia with the patient or authorized representative who has indicated his/her understanding and acceptance.     Dental advisory given  Plan Discussed with: CRNA  Anesthesia Plan Comments:         Anesthesia Quick Evaluation

## 2020-04-10 NOTE — Interval H&P Note (Signed)
History and Physical Interval Note:  04/10/2020 8:26 AM  Amber Foster  has presented today for surgery, with the diagnosis of Ventral hernia.  The various methods of treatment have been discussed with the patient and family. After consideration of risks, benefits and other options for treatment, the patient has consented to  Procedure(s): XI ROBOTIC ASSISTED VENTRAL HERNIA with mesh (N/A) as a surgical intervention.  The patient's history has been reviewed, patient examined, no change in status, stable for surgery.  I have reviewed the patient's chart and labs.  Questions were answered to the patient's satisfaction.     [name]

## 2020-04-11 ENCOUNTER — Encounter: Payer: Self-pay | Admitting: Surgery

## 2020-04-11 ENCOUNTER — Other Ambulatory Visit: Payer: Self-pay | Admitting: Surgery

## 2020-04-11 ENCOUNTER — Telehealth: Payer: Self-pay | Admitting: Surgery

## 2020-04-11 MED ORDER — OXYCODONE-ACETAMINOPHEN 7.5-325 MG PO TABS
1.0000 | ORAL_TABLET | Freq: Four times a day (QID) | ORAL | 0 refills | Status: DC | PRN
Start: 2020-04-11 — End: 2020-04-25

## 2020-04-11 NOTE — Telephone Encounter (Signed)
Per Dr.Rodenberg informed me to advised the patient that he would sent over prescription to patient's pharmacy for Oxycodone for patient's pain. Patient notified and has no further questions or concerns. Patient advised to give our office a call if she has any questions or concerns to arise.

## 2020-04-11 NOTE — Telephone Encounter (Signed)
Patient calls, she had ventral hernia surgery on 04/10/20 with Dr. Claudine Mouton.  Patient states that the pain meds she was given is not helping at all.  She does state that she also has back issues and the only thing that has helped her with the pain is Oxycodone.  Patient is requesting Oxycodone.  Please call her, thank you.

## 2020-04-11 NOTE — Anesthesia Postprocedure Evaluation (Signed)
Anesthesia Post Note  Patient: Jenesa Foresta  Procedure(s) Performed: XI ROBOTIC ASSISTED VENTRAL HERNIA with mesh (N/A Abdomen)  Patient location during evaluation: Endoscopy Anesthesia Type: General Level of consciousness: awake and alert and oriented Pain management: pain level controlled Vital Signs Assessment: post-procedure vital signs reviewed and stable Respiratory status: spontaneous breathing Cardiovascular status: blood pressure returned to baseline Anesthetic complications: no   No complications documented.   Last Vitals:  Vitals:   04/10/20 1226 04/10/20 1301  BP: 122/70 114/74  Pulse: 78   Resp: 16   Temp: (!) 36.2 C   SpO2: 96% 96%    Last Pain:  Vitals:   04/11/20 0822  TempSrc:   PainSc: 7                  Zayd Bonet

## 2020-04-17 ENCOUNTER — Telehealth: Payer: Self-pay | Admitting: Surgery

## 2020-04-17 NOTE — Telephone Encounter (Signed)
She reports that she has itching and some red bumps only on her abdomen surrounding the surgical site. She reports no other areas of itching or rash. Patient instructed this may be a reaction to the surgical prep used. She may take oral Benadryl as directed and she may also apply Cortizone cream to the rash but to avoid the surgical wounds. She may also use a light shirt or camisole top between her skin and the binder to minimize irritation. She is amendable to this and will call back with any further questions or if the rash or itching worsens.

## 2020-04-17 NOTE — Telephone Encounter (Signed)
Patient had surgery on 04/10/20, ventral hernia with Dr. Claudine Mouton.  Patient states having trouble with the brace that she is wearing.  She tries to take it off for a while to let all areas breathe, but now is having a lot of small red, raised areas that are itching her significantly.  Mostly all in the belly area and around the surgical site. Patient wants to know if there is anything she can do or take.  Please call her.  Thank you.

## 2020-04-25 ENCOUNTER — Encounter: Payer: Self-pay | Admitting: Surgery

## 2020-04-25 ENCOUNTER — Other Ambulatory Visit: Payer: Self-pay

## 2020-04-25 ENCOUNTER — Ambulatory Visit (INDEPENDENT_AMBULATORY_CARE_PROVIDER_SITE_OTHER): Payer: BC Managed Care – PPO | Admitting: Surgery

## 2020-04-25 VITALS — BP 114/78 | HR 67 | Temp 98.3°F | Ht 64.0 in | Wt 174.8 lb

## 2020-04-25 DIAGNOSIS — Z9889 Other specified postprocedural states: Secondary | ICD-10-CM

## 2020-04-25 DIAGNOSIS — Z8719 Personal history of other diseases of the digestive system: Secondary | ICD-10-CM

## 2020-04-25 NOTE — Progress Notes (Signed)
[facility] POST-OP OFFICE VISIT  04/25/2020  HPI: Amber Foster is a 39 y.o. female 15 days s/p robotic repair of ventral hernia.  She reports no pain.  But notes some discomfort with exertion.  She reports normal bowel activity, denying nausea, vomiting, fevers or chills.  She wears her abdominal binder religiously, but has noted developing a rather diffuse rash along with the torso where her binder covers.  She is utilized of multiple different topical agents including cortisone without help.  She describes having a lot of itching.  Benadryl was not helpful, as it makes her more hyper.  She is utilized a [employer] fast streamer for under the abdominal binder as well.  She admits that her husband keeps the house warmer than she likes it and this is added to her discomfort.  Vital signs: BP 114/78   Pulse 67   Temp 98.3 F (36.8 C) (Oral)   Ht 5\' 4"  (1.626 m)   Wt 174 lb 12.8 oz (79.3 kg)   SpO2 95%   BMI 30.00 kg/m    Physical Exam: Constitutional: She appears well Abdomen: Soft, nontender. Skin: Incisions are clean, dry and intact.  There is a diffuse papular nonraised rash along her abdominal region, circumferentially consistent with the area covered by the binder.  Dermabond is gone.  All incisions appear to be uninvolved.  Assessment/Plan: This is a 39 y.o. female 15 days s/p robotic repair of ventral hernia.  Patient Active Problem List   Diagnosis Date Noted  . Controlled diabetes mellitus type 2 with complications (HCC) 04/08/2020  . Numbness and tingling of right arm 04/08/2020  . Hepatic steatosis 03/14/2020  . Periumbilical abdominal pain 03/06/2020  . Essential hypertension 03/06/2020  . Abnormal uterine bleeding 08/14/2015  . Dyspareunia in female 08/14/2015  . PCOS (polycystic ovarian syndrome) 05/07/2015  . Adnexal pain 11/08/2014  . History of gestational diabetes 07/28/2013    -Discontinue wearing abdominal binder.  Continue lifting and activity precautions.   She should continue to avoid returning to work at this time.  Follow-up in 1 month or as needed.   07/30/2013 M.D., FACS 04/25/2020, 1:25 PM

## 2020-04-25 NOTE — Patient Instructions (Addendum)
Stop wearing the binder to see if this helps with the rash. Please see your follow up appointment listed below.    GENERAL POST-OPERATIVE PATIENT INSTRUCTIONS   WOUND CARE INSTRUCTIONS:  Keep a dry clean dressing on the wound if there is drainage. The initial bandage may be removed after 24 hours.  Once the wound has quit draining you may leave it open to air.  If clothing rubs against the wound or causes irritation and the wound is not draining you may cover it with a dry dressing during the daytime.  Try to keep the wound dry and avoid ointments on the wound unless directed to do so.  If the wound becomes bright red and painful or starts to drain infected material that is not clear, please contact your physician immediately.  If the wound is mildly pink and has a thick firm ridge underneath it, this is normal, and is referred to as a healing ridge.  This will resolve over the next 4-6 weeks.  BATHING: You may shower if you have been informed of this by your surgeon. However, Please do not submerge in a tub, hot tub, or pool until incisions are completely sealed or have been told by your surgeon that you may do so.  DIET:  You may eat any foods that you can tolerate.  It is a good idea to eat a high fiber diet and take in plenty of fluids to prevent constipation.  If you do become constipated you may want to take a mild laxative or take ducolax tablets on a daily basis until your bowel habits are regular.  Constipation can be very uncomfortable, along with straining, after recent surgery.  ACTIVITY:  You are encouraged to cough and deep breath or use your incentive spirometer if you were given one, every 15-30 minutes when awake.  This will help prevent respiratory complications and low grade fevers post-operatively if you had a general anesthetic.  You may want to hug a pillow when coughing and sneezing to add additional support to the surgical area, if you had abdominal or chest surgery, which will  decrease pain during these times.  You are encouraged to walk and engage in light activity for the next two weeks.  You should not lift more than 20 pounds, until   05/22/20  as it could put you at increased risk for complications.  Twenty pounds is roughly equivalent to a plastic bag of groceries. At that time- Listen to your body when lifting, if you have pain when lifting, stop and then try again in a few days. Soreness after doing exercises or activities of daily living is normal as you get back in to your normal routine.  MEDICATIONS:  Try to take narcotic medications and anti-inflammatory medications, such as tylenol, ibuprofen, naprosyn, etc., with food.  This will minimize stomach upset from the medication.  Should you develop nausea and vomiting from the pain medication, or develop a rash, please discontinue the medication and contact your physician.  You should not drive, make important decisions, or operate machinery when taking narcotic pain medication.  SUNBLOCK Use sun block to incision area over the next year if this area will be exposed to sun. This helps decrease scarring and will allow you avoid a permanent darkened area over your incision.  QUESTIONS:  Please feel free to call our office if you have any questions, and we will be glad to assist you. [phone]

## 2020-05-13 ENCOUNTER — Institutional Professional Consult (permissible substitution): Payer: BC Managed Care – PPO | Admitting: Pulmonary Disease

## 2020-05-28 ENCOUNTER — Encounter: Payer: Self-pay | Admitting: Surgery

## 2020-05-28 ENCOUNTER — Ambulatory Visit (INDEPENDENT_AMBULATORY_CARE_PROVIDER_SITE_OTHER): Payer: BC Managed Care – PPO | Admitting: Surgery

## 2020-05-28 ENCOUNTER — Other Ambulatory Visit: Payer: Self-pay

## 2020-05-28 VITALS — BP 131/85 | HR 69 | Temp 98.4°F | Ht 64.0 in | Wt 178.8 lb

## 2020-05-28 DIAGNOSIS — Z9889 Other specified postprocedural states: Secondary | ICD-10-CM

## 2020-05-28 DIAGNOSIS — Z8719 Personal history of other diseases of the digestive system: Secondary | ICD-10-CM

## 2020-05-28 NOTE — Patient Instructions (Signed)
Please call our office if you have questions at this time- You may resume normal activities at this time.

## 2020-05-28 NOTE — Progress Notes (Signed)
[facility] POST-OP OFFICE VISIT  05/28/2020  HPI: Amber Foster is a 39 y.o. female 6 weeks s/p ventral hernia repair.  She reports near complete resolution of all her abdominal issues, minimal soreness in the area centrally, all the lateral incision sites are nontender and she can move much more freely now.  Vital signs: BP 131/85   Pulse 69   Temp 98.4 F (36.9 C) (Oral)   Ht 5\' 4"  (1.626 m)   Wt 178 lb 12.8 oz (81.1 kg)   SpO2 97%   BMI 30.69 kg/m    Physical Exam: Constitutional: She appears great. Abdomen: Soft and benign, with head lift there is no evidence of any significant bulge or elicitation of pain. Skin: All incisions are well-healed.  Assessment/Plan: This is a 39 y.o. female 6 weeks s/p robotic ventral hernia repair with mesh.  Patient Active Problem List   Diagnosis Date Noted  . Controlled diabetes mellitus type 2 with complications (HCC) 04/08/2020  . Numbness and tingling of right arm 04/08/2020  . Hepatic steatosis 03/14/2020  . Periumbilical abdominal pain 03/06/2020  . Essential hypertension 03/06/2020  . Abnormal uterine bleeding 08/14/2015  . Dyspareunia in female 08/14/2015  . PCOS (polycystic ovarian syndrome) 05/07/2015  . Adnexal pain 11/08/2014  . History of gestational diabetes 07/28/2013    -Full release to work.  Questions answered.  She may follow-up as needed.   07/30/2013 M.D., FACS 05/28/2020, 9:55 AM m

## 2020-11-07 IMAGING — DX DG CHEST 1V PORT
1 series · 1 of 1 positions shown · non-contrast
Comparison: 04/29/2018

CLINICAL DATA: Fever. Sepsis workup. Recent postpartum.

EXAM:
PORTABLE CHEST 1 VIEW

[chest ap]
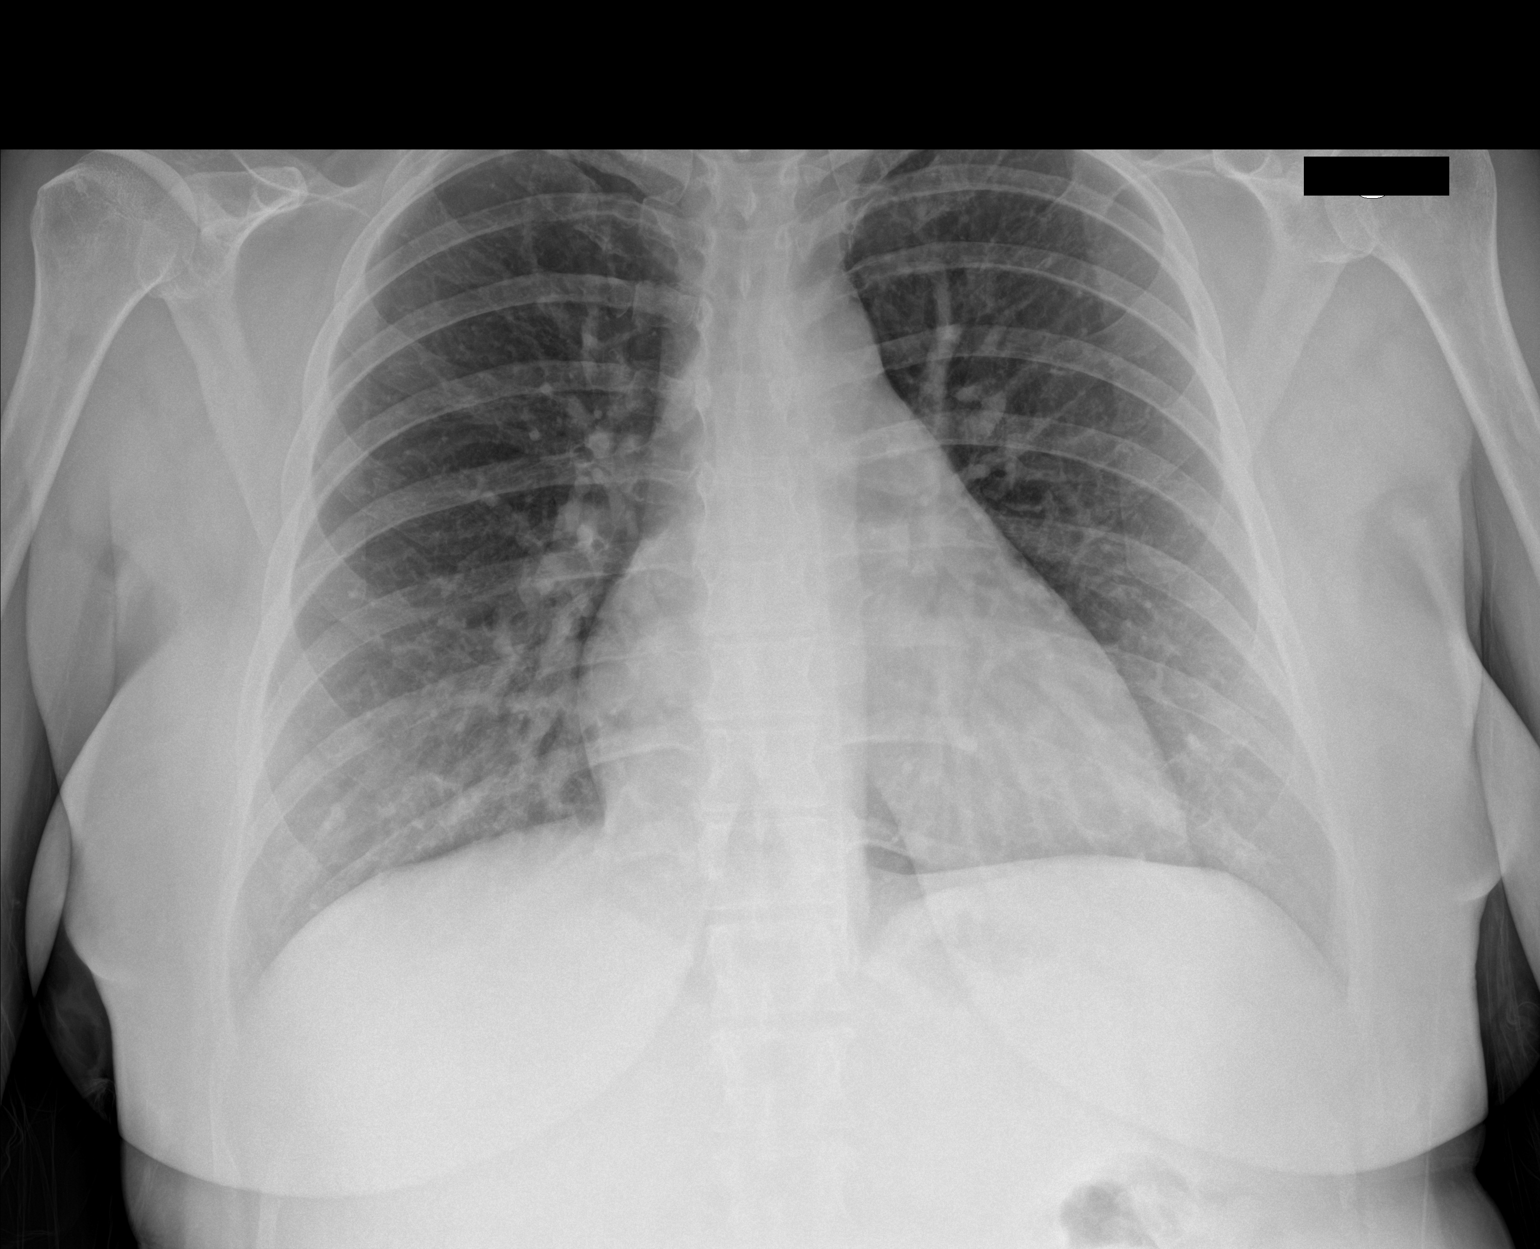

[1 of 1 positions shown; findings below may reference images not displayed]

FINDINGS: The cardiomediastinal contours are normal. Possible small pleural
effusions. Pulmonary vasculature is normal. No consolidation or
pneumothorax. No acute osseous abnormalities are seen.
IMPRESSION: Possible small pleural effusions. No consolidation to suggest
pneumonia.

## 2021-08-18 ENCOUNTER — Ambulatory Visit (INDEPENDENT_AMBULATORY_CARE_PROVIDER_SITE_OTHER): Payer: BC Managed Care – PPO | Admitting: Family Medicine

## 2021-08-18 ENCOUNTER — Other Ambulatory Visit: Payer: Self-pay

## 2021-08-18 VITALS — BP 160/104 | HR 72 | Temp 97.8°F | Ht 63.5 in | Wt 170.0 lb

## 2021-08-18 DIAGNOSIS — R200 Anesthesia of skin: Secondary | ICD-10-CM | POA: Diagnosis not present

## 2021-08-18 DIAGNOSIS — I10 Essential (primary) hypertension: Secondary | ICD-10-CM | POA: Diagnosis not present

## 2021-08-18 DIAGNOSIS — E282 Polycystic ovarian syndrome: Secondary | ICD-10-CM | POA: Diagnosis not present

## 2021-08-18 DIAGNOSIS — R202 Paresthesia of skin: Secondary | ICD-10-CM | POA: Diagnosis not present

## 2021-08-18 DIAGNOSIS — E118 Type 2 diabetes mellitus with unspecified complications: Secondary | ICD-10-CM

## 2021-08-18 DIAGNOSIS — Z3009 Encounter for other general counseling and advice on contraception: Secondary | ICD-10-CM

## 2021-08-18 LAB — COMPREHENSIVE METABOLIC PANEL
ALT: 42 U/L — ABNORMAL HIGH (ref 0–35)
AST: 30 U/L (ref 0–37)
Albumin: 4.3 g/dL (ref 3.5–5.2)
Alkaline Phosphatase: 97 U/L (ref 39–117)
BUN: 14 mg/dL (ref 6–23)
CO2: 28 mEq/L (ref 19–32)
Calcium: 9.6 mg/dL (ref 8.4–10.5)
Chloride: 99 mEq/L (ref 96–112)
Creatinine, Ser: 0.63 mg/dL (ref 0.40–1.20)
GFR: 111.42 mL/min (ref >60.00)
Glucose, Bld: 182 mg/dL — ABNORMAL HIGH (ref 70–99)
Potassium: 4 mEq/L (ref 3.5–5.1)
Sodium: 138 mEq/L (ref 135–145)
Total Bilirubin: 0.6 mg/dL (ref 0.2–1.2)
Total Protein: 7 g/dL (ref 6.0–8.3)

## 2021-08-18 LAB — CBC
HCT: 42.4 % (ref 36.0–46.0)
Hemoglobin: 14.4 g/dL (ref 12.0–15.0)
MCHC: 34 g/dL (ref 30.0–36.0)
MCV: 88.5 fl (ref 78.0–100.0)
Platelets: 234 10*3/uL (ref 150.0–400.0)
RBC: 4.79 Mil/uL (ref 3.87–5.11)
RDW: 12.8 % (ref 11.5–15.5)
WBC: 8.9 10*3/uL (ref 4.0–10.5)

## 2021-08-18 LAB — LIPID PANEL
Cholesterol: 188 mg/dL (ref 0–200)
HDL: 37.9 mg/dL — ABNORMAL LOW (ref >39.00)
LDL Cholesterol: 113 mg/dL — ABNORMAL HIGH (ref 0–99)
NonHDL: 149.65
Total CHOL/HDL Ratio: 5
Triglycerides: 181 mg/dL — ABNORMAL HIGH (ref 0.0–149.0)
VLDL: 36.2 mg/dL (ref 0.0–40.0)

## 2021-08-18 LAB — POCT GLYCOSYLATED HEMOGLOBIN (HGB A1C): Hemoglobin A1C: 8 % — AB (ref 4.0–5.6)

## 2021-08-18 LAB — VITAMIN B12: Vitamin B-12: 660 pg/mL (ref 211–911)

## 2021-08-18 LAB — TSH: TSH: 2.68 u[IU]/mL (ref 0.35–5.50)

## 2021-08-18 LAB — POCT URINE PREGNANCY: Preg Test, Ur: NEGATIVE

## 2021-08-18 MED ORDER — SPRINTEC 28 0.25-35 MG-MCG PO TABS: 1.0000 | ORAL_TABLET | Freq: Every day | ORAL | 3 refills | Status: DC

## 2021-08-18 MED ORDER — MELOXICAM 7.5 MG PO TABS: 7.5000 mg | ORAL_TABLET | Freq: Every day | ORAL | 0 refills | Status: DC

## 2021-08-18 MED ORDER — METFORMIN HCL 500 MG PO TABS: 1000.0000 mg | ORAL_TABLET | Freq: Two times a day (BID) | ORAL | 0 refills | Status: DC

## 2021-08-18 MED ORDER — LOSARTAN POTASSIUM 25 MG PO TABS: 25.0000 mg | ORAL_TABLET | Freq: Every day | ORAL | 0 refills | Status: DC

## 2021-08-18 NOTE — Assessment & Plan Note (Signed)
Pressure not at goal in setting of being out of medication.  Restart losartan 25 mg daily, update if blood pressure not improving. ?

## 2021-08-18 NOTE — Assessment & Plan Note (Signed)
She notes heavy bleeding, restarting Sprintec as this was effective in the past.  Urine pregnancy was negative today.  Labs to assess for anemia due to heavy bleeding. ?

## 2021-08-18 NOTE — Assessment & Plan Note (Signed)
Complicated by hypertension.  No longer with good control off of medication.  Discussed starting metformin and working her way to 1000 mg twice daily.  Encouraged diabetic diet at this time she is not interested in nutrition referral.  Return in 3 months ?

## 2021-08-18 NOTE — Patient Instructions (Addendum)
Your hemoglobin A1c was elevated and shows that you have diabetes.  ? ?Diabetes is treated with diet and medication- avoid sugar (especially sugary beverages like soda and sweet tea), carbohydrates - like baked goods and bread. Try to eat lean protein (fish and chicken) and lots of green veggies. You can go to Diabetes.org for more information on dietary changes and call the clinic and I can place a referral to see a diabetes nutritionist.  ? ?I would like you to start a medication call Metformin.  ? ?I am prescribing a 500 mg tablet. The most common side effect is stomach upset (nausea and diarrhea). Below is a 4 week plan to increase. If you are experiencing side effects, do not move on to the next week unless your symptoms are better.  ? ?Week 1: Take 500 mg in the morning with food ?Week 2: Take 500 mg in the morning and the evening with food ?Week 3: Take 1000 mg (2 tablets) in the morning and 500 mg in the evening with food ?Week 4: Take 1000 mg (2 tablets) in the morning and evening with food ? ?#Shoulder pain ?- taking meloxicam daily for 2 weeks ?- physical therapy if you can ?- Dr. Lorelei Pont - if no improvement in 6 weeks - sports medicine ? ?#Blood pressure ?- update if still high on medication ?

## 2021-08-18 NOTE — Progress Notes (Signed)
? ?Subjective:  ? ?  ?Amber Foster is a 41 y.o. female presenting for Follow-up (BP/DM /Has been out of BP meds for a couple months. ) ?  ? ? ?HPI ? ?#HTN ?- has been out of losartan x 2 months ?- home monitoring - 140/80s - off medication ? ?#OCPs ?- is out of birth control ?- last sexual activity 1 week ago ?- on her period currently - LMP 08/14/2021 ?- helps with PCOS - which is worse since stopping ?- having very heavy bleeding ?- ran out 2 months ago ? ?#Diabetes ?Currently taking nothing  ?Not following diet ?Last HgbA1c:  ?Lab Results  ?Component Value Date  ? HGBA1C 8.0 (A) 08/18/2021  ? ? ?Diabetes Health Maintenance Due:  ?  ?Diabetes Health Maintenance Due  ?Topic Date Due  ? FOOT EXAM  Never done  ? OPHTHALMOLOGY EXAM  Never done  ? HEMOGLOBIN A1C  02/18/2022  ? ? ?#arm pain ?- persisting  ?- trouble lifting ?- drops things due to pain ?-not sure if she can do PT ? ? ? ? ?Review of Systems ? ? ?Social History  ? ?Tobacco Use  ?Smoking Status Never  ?Smokeless Tobacco Never  ? ? ? ?   ?Objective:  ?  ?BP Readings from Last 3 Encounters:  ?08/18/21 (!) 160/104  ?05/28/20 131/85  ?04/25/20 114/78  ? ?Wt Readings from Last 3 Encounters:  ?08/18/21 170 lb (77.1 kg)  ?05/28/20 178 lb 12.8 oz (81.1 kg)  ?04/25/20 174 lb 12.8 oz (79.3 kg)  ? ? ?BP (!) 160/104   Pulse 72   Temp 97.8 ?F (36.6 ?C) (Oral)   Ht 5' 3.5" (1.613 m)   Wt 170 lb (77.1 kg)   SpO2 98%   BMI 29.64 kg/m?  ? ? ?Physical Exam ?Constitutional:   ?   General: She is not in acute distress. ?   Appearance: She is well-developed. She is not diaphoretic.  ?HENT:  ?   Right Ear: External ear normal.  ?   Left Ear: External ear normal.  ?Eyes:  ?   Conjunctiva/sclera: Conjunctivae normal.  ?Cardiovascular:  ?   Rate and Rhythm: Normal rate and regular rhythm.  ?   Heart sounds: No murmur heard. ?Pulmonary:  ?   Effort: Pulmonary effort is normal. No respiratory distress.  ?   Breath sounds: Normal breath sounds. No wheezing.  ?Musculoskeletal:  ?    Cervical back: Neck supple.  ?   Comments: Right shoulder ?Inspection: No swelling ?Palpation: some TTP along the anterior shoulder and the biceps muscle ?ROM: normal, but with popping and discomfort with the end of flexion/abduction ?Strength: normal rotator cuff ?Impingement testing - induces pain symptoms  ?Skin: ?   General: Skin is warm and dry.  ?   Capillary Refill: Capillary refill takes less than 2 seconds.  ?Neurological:  ?   Mental Status: She is alert. Mental status is at baseline.  ?Psychiatric:     ?   Mood and Affect: Mood normal.     ?   Behavior: Behavior normal.  ? ? ? ? ? ?   ?Assessment & Plan:  ? ?Problem List Items Addressed This Visit   ? ?  ? Cardiovascular and Mediastinum  ? Essential hypertension  ?  Pressure not at goal in setting of being out of medication.  Restart losartan 25 mg daily, update if blood pressure not improving. ?  ?  ? Relevant Medications  ? losartan (COZAAR) 25 MG tablet  ?  Other Relevant Orders  ? Comprehensive metabolic panel  ? CBC  ?  ? Endocrine  ? PCOS (polycystic ovarian syndrome)  ?  She notes heavy bleeding, restarting Sprintec as this was effective in the past.  Urine pregnancy was negative today.  Labs to assess for anemia due to heavy bleeding. ?  ?  ? Relevant Medications  ? SPRINTEC 28 0.25-35 MG-MCG tablet  ? Other Relevant Orders  ? POCT urine pregnancy (Completed)  ? Controlled diabetes mellitus type 2 with complications (HCC) - Primary  ?  Complicated by hypertension.  No longer with good control off of medication.  Discussed starting metformin and working her way to 1000 mg twice daily.  Encouraged diabetic diet at this time she is not interested in nutrition referral.  Return in 3 months ?  ?  ? Relevant Medications  ? losartan (COZAAR) 25 MG tablet  ? metFORMIN (GLUCOPHAGE) 500 MG tablet  ? Other Relevant Orders  ? POCT glycosylated hemoglobin (Hb A1C) (Completed)  ? Lipid panel  ?  ? Other  ? Numbness and tingling of right arm  ?  Suspect this may  be some impingement syndrome, trial of meloxicam once daily for 2 weeks.  Advised PT referral given symptoms for greater than 1 year she is not sure she is good to be able to go.  Advised follow-up with Dr. Lurlean Nanny if symptoms not improving in 6 weeks. ?  ?  ? Relevant Medications  ? meloxicam (MOBIC) 7.5 MG tablet  ? Other Relevant Orders  ? TSH  ? Vitamin B12  ? Ambulatory referral to Physical Therapy  ? ?Other Visit Diagnoses   ? ? Birth control counseling      ? Relevant Medications  ? SPRINTEC 28 0.25-35 MG-MCG tablet  ? Other Relevant Orders  ? POCT urine pregnancy (Completed)  ? ?  ? ? ? ?Return in about 3 months (around 11/18/2021) for Diabetes. ? ?Lynnda Child, MD ? ?This visit occurred during the SARS-CoV-2 public health emergency.  Safety protocols were in place, including screening questions prior to the visit, additional usage of staff PPE, and extensive cleaning of exam room while observing appropriate contact time as indicated for disinfecting solutions.  ? ?

## 2021-08-18 NOTE — Assessment & Plan Note (Signed)
Suspect this may be some impingement syndrome, trial of meloxicam once daily for 2 weeks.  Advised PT referral given symptoms for greater than 1 year she is not sure she is good to be able to go.  Advised follow-up with Dr. Lurlean Nanny if symptoms not improving in 6 weeks. ?

## 2021-08-27 ENCOUNTER — Other Ambulatory Visit: Payer: Self-pay | Admitting: Family Medicine

## 2021-08-27 DIAGNOSIS — R200 Anesthesia of skin: Secondary | ICD-10-CM

## 2021-08-27 NOTE — Progress Notes (Signed)
error 

## 2021-11-24 ENCOUNTER — Ambulatory Visit: Payer: BC Managed Care – PPO | Admitting: Family Medicine

## 2021-11-25 ENCOUNTER — Other Ambulatory Visit: Payer: Self-pay | Admitting: Family Medicine

## 2021-11-25 ENCOUNTER — Ambulatory Visit (INDEPENDENT_AMBULATORY_CARE_PROVIDER_SITE_OTHER): Payer: BC Managed Care – PPO | Admitting: Family Medicine

## 2021-11-25 VITALS — BP 128/80 | HR 77 | Temp 97.6°F | Ht 63.5 in | Wt 162.1 lb

## 2021-11-25 DIAGNOSIS — K760 Fatty (change of) liver, not elsewhere classified: Secondary | ICD-10-CM | POA: Diagnosis not present

## 2021-11-25 DIAGNOSIS — E118 Type 2 diabetes mellitus with unspecified complications: Secondary | ICD-10-CM

## 2021-11-25 DIAGNOSIS — I10 Essential (primary) hypertension: Secondary | ICD-10-CM

## 2021-11-25 LAB — POCT GLYCOSYLATED HEMOGLOBIN (HGB A1C): Hemoglobin A1C: 6 % — AB (ref 4.0–5.6)

## 2021-11-25 MED ORDER — ACCU-CHEK SOFTCLIX LANCETS MISC: 12 refills | Status: DC

## 2021-11-25 MED ORDER — METFORMIN HCL ER 500 MG PO TB24: 1000.0000 mg | ORAL_TABLET | Freq: Every day | ORAL | 0 refills | Status: DC

## 2021-11-25 MED ORDER — ACCU-CHEK AVIVA PLUS VI STRP: ORAL_STRIP | 12 refills | Status: DC

## 2021-11-25 MED ORDER — LOSARTAN POTASSIUM 25 MG PO TABS: 25.0000 mg | ORAL_TABLET | Freq: Every day | ORAL | 0 refills | Status: DC

## 2021-11-25 MED ORDER — BLOOD GLUCOSE MONITOR KIT: PACK | 0 refills | Status: DC

## 2021-11-25 NOTE — Assessment & Plan Note (Signed)
Blood pressure at goal, continue losartan 25 mg.

## 2021-11-25 NOTE — Patient Instructions (Signed)
Stop twice daily metformin  Start Daily metformin long acting 1000 mg with food  If nausea or diarrhea decrease to 500 mg   Fasting CBG <140 2 hours after eating goal <160

## 2021-11-25 NOTE — Assessment & Plan Note (Signed)
Lab Results  Component Value Date   HGBA1C 6.0 (A) 11/25/2021   Significant improvement, she has side effects with metformin.  Discussed switching to long-acting metformin and reducing dose from 2000 mg daily to 1000 mg daily.  Update if side effects do not improve.  In 3 months for recheck

## 2021-11-25 NOTE — Assessment & Plan Note (Signed)
Plan to repeat LFTs in 3 months.  Continue to work on weight loss, healthy eating, exercise.

## 2021-11-25 NOTE — Progress Notes (Signed)
Subjective:     Amber Foster is a 41 y.o. female presenting for Follow-up (3 mo DM )     HPI  #Diabetes Currently taking metformin  Using medications without difficulties: vomiting initially at the higher dose, stomach bad for 3 months - much better but still getting some nausea in the evening Hypoglycemic episodes: unable to check, but suspects she has had some lows - typically if she has not eaten for a long time Hyperglycemic episodes: does not check, but thinks she gets some highs Feet problems:No  Blood Sugars averaging: does not check Last HgbA1c:  Lab Results  Component Value Date   HGBA1C 6.0 (A) 11/25/2021    Diabetes Health Maintenance Due:    Diabetes Health Maintenance Due  Topic Date Due   FOOT EXAM  Never done   OPHTHALMOLOGY EXAM  Never done   HEMOGLOBIN A1C  05/27/2022   Diet: doing better, has a glucometer     Review of Systems   Social History   Tobacco Use  Smoking Status Never  Smokeless Tobacco Never        Objective:    BP Readings from Last 3 Encounters:  11/25/21 128/80  08/18/21 (!) 160/104  05/28/20 131/85   Wt Readings from Last 3 Encounters:  11/25/21 162 lb 2 oz (73.5 kg)  08/18/21 170 lb (77.1 kg)  05/28/20 178 lb 12.8 oz (81.1 kg)    BP 128/80   Pulse 77   Temp 97.6 F (36.4 C) (Temporal)   Ht 5' 3.5" (1.613 m)   Wt 162 lb 2 oz (73.5 kg)   LMP 11/06/2021 (Exact Date)   SpO2 98%   BMI 28.27 kg/m    Physical Exam Constitutional:      General: She is not in acute distress.    Appearance: She is well-developed. She is not diaphoretic.  HENT:     Right Ear: External ear normal.     Left Ear: External ear normal.     Nose: Nose normal.  Eyes:     Conjunctiva/sclera: Conjunctivae normal.  Cardiovascular:     Rate and Rhythm: Normal rate and regular rhythm.     Heart sounds: No murmur heard. Pulmonary:     Effort: Pulmonary effort is normal. No respiratory distress.     Breath sounds: Normal breath sounds.  No wheezing.  Musculoskeletal:     Cervical back: Neck supple.  Skin:    General: Skin is warm and dry.     Capillary Refill: Capillary refill takes less than 2 seconds.  Neurological:     Mental Status: She is alert. Mental status is at baseline.  Psychiatric:        Mood and Affect: Mood normal.        Behavior: Behavior normal.           Assessment & Plan:   Problem List Items Addressed This Visit       Cardiovascular and Mediastinum   Essential hypertension    Blood pressure at goal, continue losartan 25 mg.      Relevant Medications   losartan (COZAAR) 25 MG tablet     Digestive   Hepatic steatosis    Plan to repeat LFTs in 3 months.  Continue to work on weight loss, healthy eating, exercise.        Endocrine   Controlled diabetes mellitus type 2 with complications [facility]) - Primary    Lab Results  Component Value Date   HGBA1C 6.0 (A) 11/25/2021  Significant improvement, she has side effects with metformin.  Discussed switching to long-acting metformin and reducing dose from 2000 mg daily to 1000 mg daily.  Update if side effects do not improve.  In 3 months for recheck      Relevant Medications   losartan (COZAAR) 25 MG tablet   blood glucose meter kit and supplies KIT   metFORMIN (GLUCOPHAGE-XR) 500 MG 24 hr tablet   Other Relevant Orders   POCT glycosylated hemoglobin (Hb A1C) (Completed)     Return in about 3 months (around 02/25/2022) for diabetes.  Jessica R Cody, MD    

## 2021-12-13 IMAGING — CT CT ABD-PELV W/ CM
2 of 4 series · 16 of 46 positions shown, 18 images · IV contrast (OMNIPAQUE 300)
Comparison: None.

CLINICAL DATA: Paraumbilical abdominal pain and vomiting for 3
weeks. Suspected hernia.

EXAM:
CT ABDOMEN AND PELVIS WITH CONTRAST
TECHNIQUE: Multidetector CT imaging of the abdomen and pelvis was performed
using the standard protocol following bolus administration of
intravenous contrast.
CONTRAST:  100mL OMNIPAQUE IOHEXOL 300 MG/ML  SOLN

[Series 2: abd/pel w · axial · 0.77mm/px · z∈[-472,-67]mm · 13 of 89 slices shown, 15 images]
[im 4/89  soft-tissue]
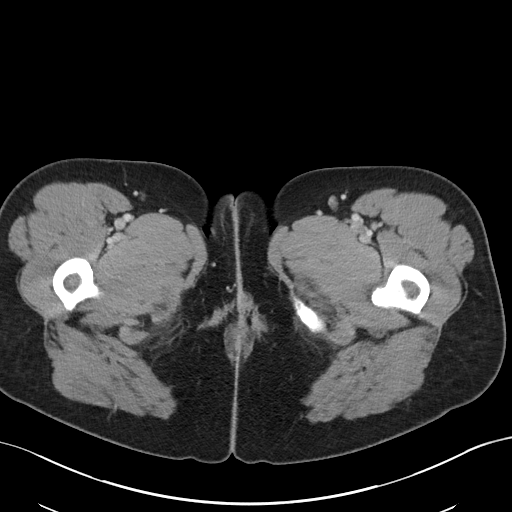
[im 4/89  bone]
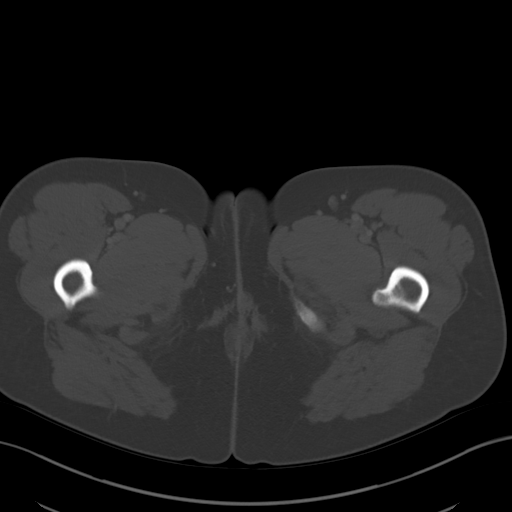
[im 11/89  soft-tissue]
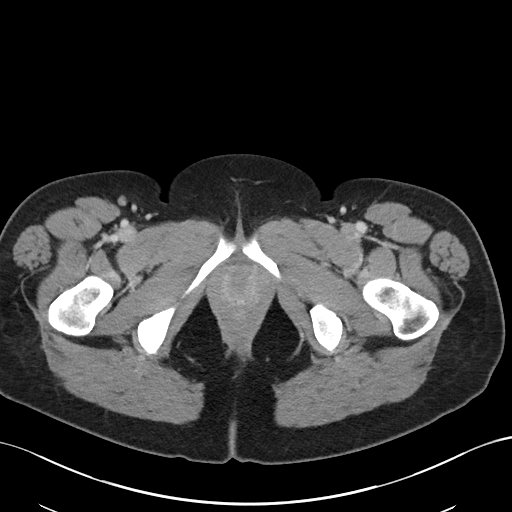
[im 18/89  soft-tissue]
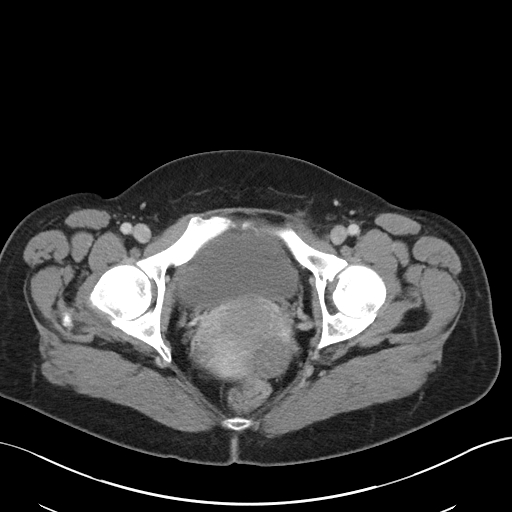
[im 25/89  soft-tissue]
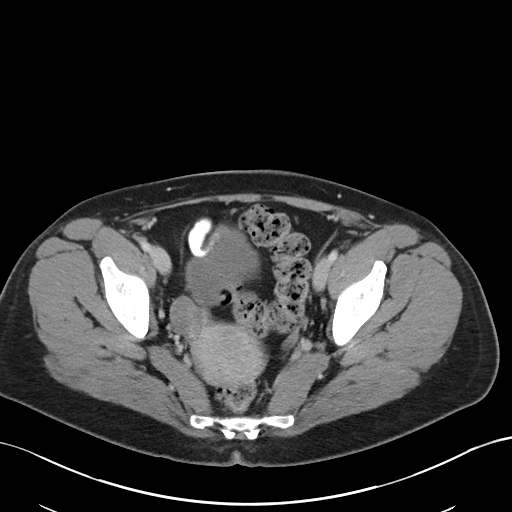
[im 32/89  soft-tissue]
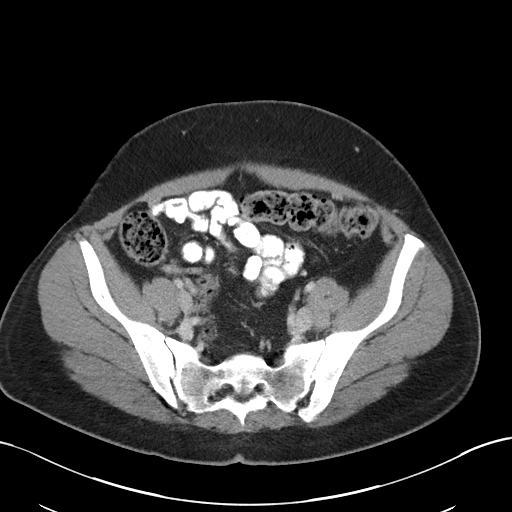
[im 39/89  soft-tissue]
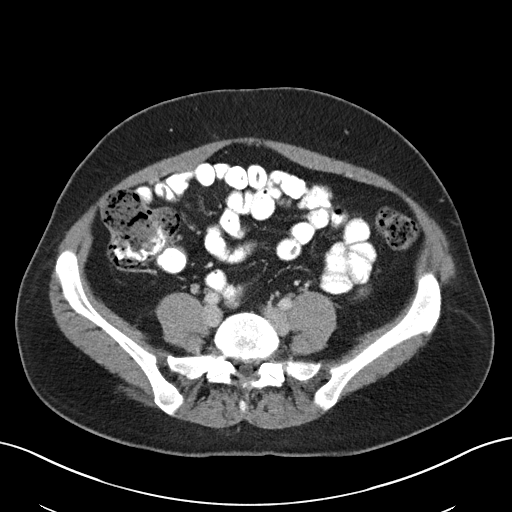
[im 46/89  soft-tissue]
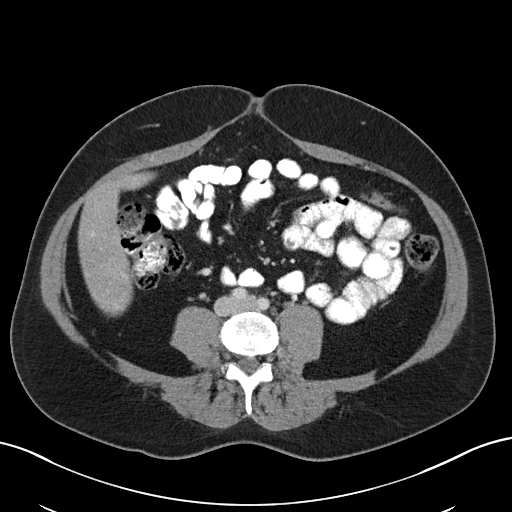
[im 50/89  soft-tissue]
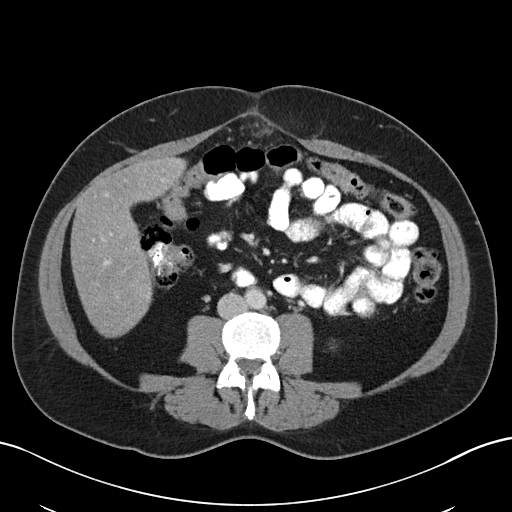
[im 57/89  soft-tissue]
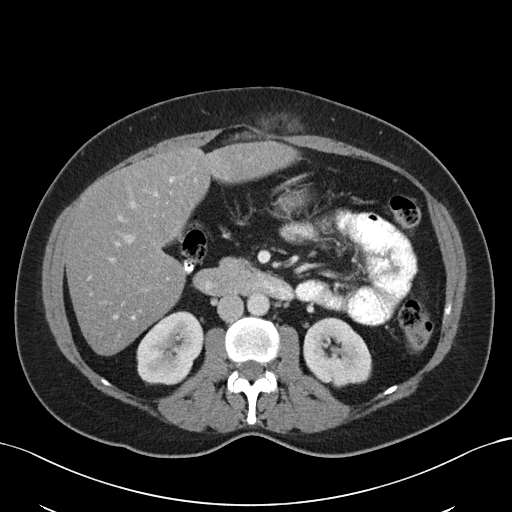
[im 57/89  bone]
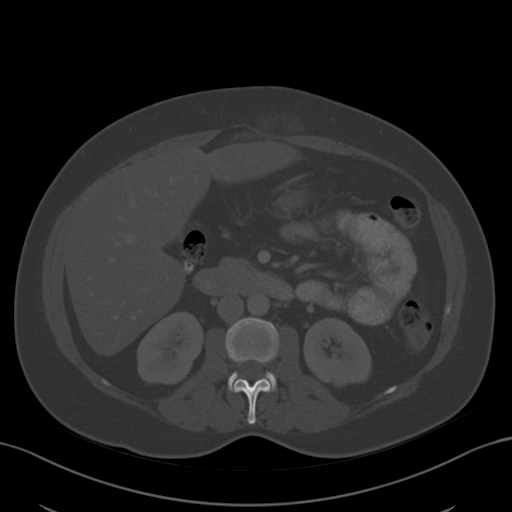
[im 64/89  soft-tissue]
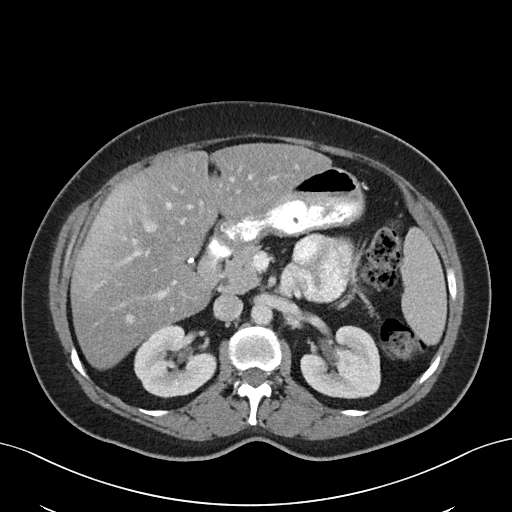
[im 71/89  soft-tissue]
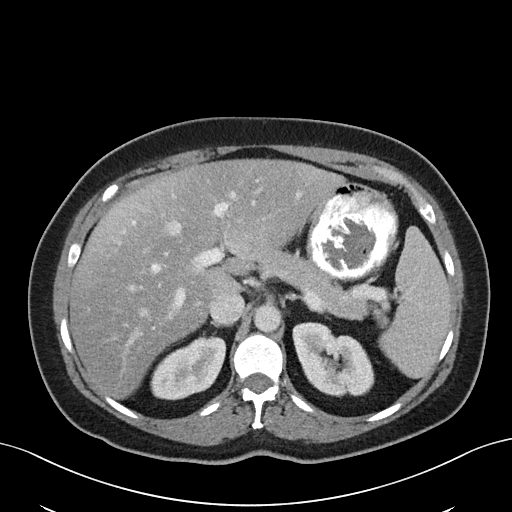
[im 78/89  soft-tissue]
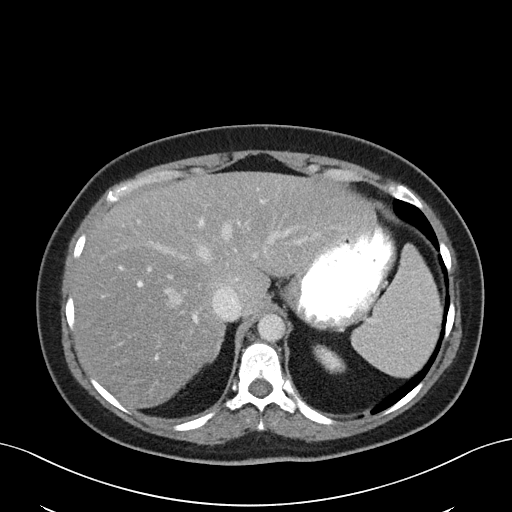
[im 85/89  soft-tissue]
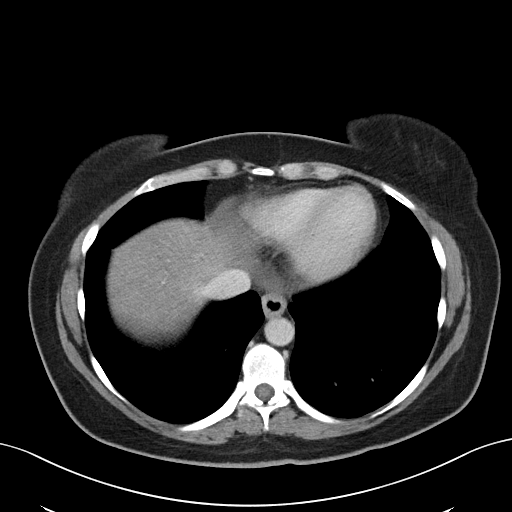

[Series 5: abd/pel w st · coronal · 0.68mm/px · 3 of 89 slices shown]
[im 30/89  soft-tissue]
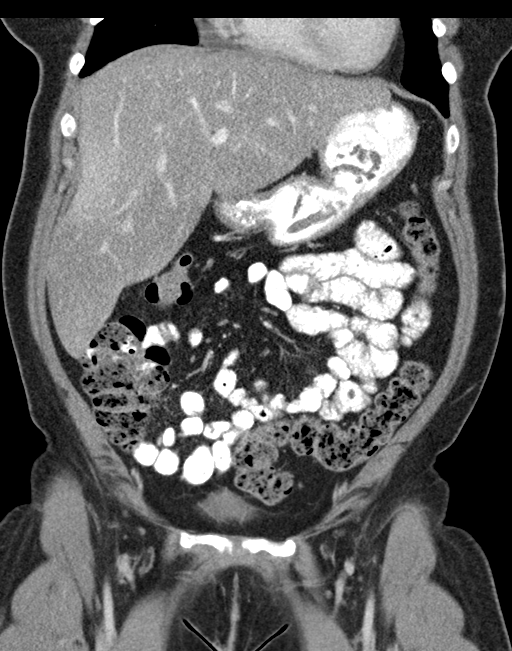
[im 40/89  soft-tissue]
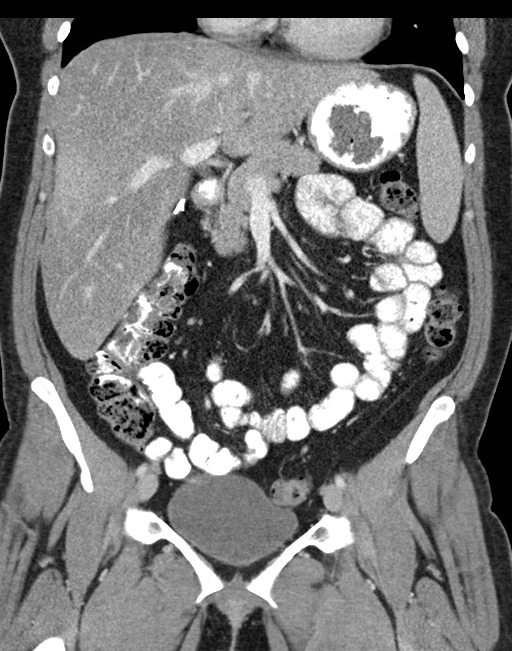
[im 49/89  soft-tissue]
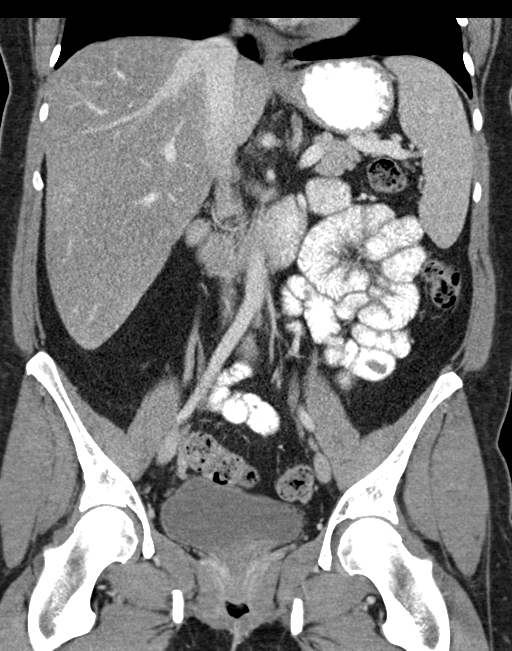

[16 of 46 positions shown; findings below may reference images not displayed]

FINDINGS: Lower Chest: No acute findings.

Hepatobiliary: No hepatic masses identified. Moderate diffuse
hepatic steatosis noted. Prior cholecystectomy. No evidence of
biliary obstruction.

Pancreas:  No mass or inflammatory changes.

Spleen: Within normal limits in size and appearance.

Adrenals/Urinary Tract: No masses identified. A few tiny less than 5
mm calculi are seen in the lower pole of the left kidney. No
evidence of ureteral calculi or hydronephrosis.

Stomach/Bowel: No evidence of obstruction, inflammatory process or
abnormal fluid collections. Normal appendix visualized.

Vascular/Lymphatic: No pathologically enlarged lymph nodes. No
abdominal aortic aneurysm.

Reproductive:  No mass or other significant abnormality.

Other: A small paraumbilical ventral hernia is seen containing only
omental fat.

Musculoskeletal:  No suspicious bone lesions identified.
IMPRESSION: Small paraumbilical ventral hernia containing only omental fat.

Tiny nonobstructing left renal calculi. No evidence of ureteral
calculi or hydronephrosis.

Moderate hepatic steatosis.

## 2022-02-20 ENCOUNTER — Other Ambulatory Visit: Payer: Self-pay | Admitting: Family Medicine

## 2022-02-20 DIAGNOSIS — I10 Essential (primary) hypertension: Secondary | ICD-10-CM

## 2022-02-20 DIAGNOSIS — E118 Type 2 diabetes mellitus with unspecified complications: Secondary | ICD-10-CM

## 2022-02-25 ENCOUNTER — Ambulatory Visit (INDEPENDENT_AMBULATORY_CARE_PROVIDER_SITE_OTHER): Payer: BC Managed Care – PPO | Admitting: Family Medicine

## 2022-02-25 DIAGNOSIS — E118 Type 2 diabetes mellitus with unspecified complications: Secondary | ICD-10-CM | POA: Diagnosis not present

## 2022-02-25 DIAGNOSIS — I10 Essential (primary) hypertension: Secondary | ICD-10-CM | POA: Diagnosis not present

## 2022-02-25 LAB — POCT GLYCOSYLATED HEMOGLOBIN (HGB A1C): Hemoglobin A1C: 6.3 % — AB (ref 4.0–5.6)

## 2022-02-25 MED ORDER — LOSARTAN POTASSIUM 25 MG PO TABS: 25.0000 mg | ORAL_TABLET | Freq: Every day | ORAL | 1 refills | Status: DC

## 2022-02-25 MED ORDER — METFORMIN HCL ER 500 MG PO TB24: 500.0000 mg | ORAL_TABLET | Freq: Every day | ORAL | 1 refills | Status: DC

## 2022-02-25 NOTE — Patient Instructions (Addendum)
Decrease metformin to 500 mg daily  If low CBG persist, then stop the medication   Stop Losartan 25 mg Monitor at home Update in 2 weeks with readings

## 2022-02-25 NOTE — Assessment & Plan Note (Signed)
Lab Results  Component Value Date   HGBA1C 6.3 (A) 02/25/2022   Controlled, however with low CBG. Decrease metfromin from 1000 mg daily to 500 mg daily. Update if lows do no improve or if high cbg.

## 2022-02-25 NOTE — Assessment & Plan Note (Addendum)
BP low normal and getting lightheaded. Stop losartan 25 mg. Update in 2 weeks with home readings.

## 2022-02-25 NOTE — Progress Notes (Signed)
Subjective:     Amber Foster is a 41 y.o. female presenting for Follow-up (3 mo- DM )     HPI  #Diabetes Currently taking metformin (Glucophage, [place])  Using medications without difficulties: Yes Hypoglycemic episodes:Yes  Hyperglycemic episodes:Yes  Feet problems:No  Blood Sugars averaging: 60-160 Last HgbA1c:  Lab Results  Component Value Date   HGBA1C 6.3 (A) 02/25/2022    Diabetes Health Maintenance Due:    Diabetes Health Maintenance Due  Topic Date Due   FOOT EXAM  Never done   OPHTHALMOLOGY EXAM  Never done   HEMOGLOBIN A1C  08/26/2022    HTN - has cuff at home - taking losartan 25 mg  11/25/2021: DM - metformin reduced to 1000 mg daily   Review of Systems   Social History   Tobacco Use  Smoking Status Never  Smokeless Tobacco Never        Objective:    BP Readings from Last 3 Encounters:  02/25/22 110/70  11/25/21 128/80  08/18/21 (!) 160/104   Wt Readings from Last 3 Encounters:  02/25/22 163 lb 4 oz (74 kg)  11/25/21 162 lb 2 oz (73.5 kg)  08/18/21 170 lb (77.1 kg)    BP 110/70   Pulse 74   Temp (!) 97.4 F (36.3 C) (Temporal)   Ht 5' 3.5" (1.613 m)   Wt 163 lb 4 oz (74 kg)   SpO2 97%   BMI 28.46 kg/m    Physical Exam Constitutional:      General: She is not in acute distress.    Appearance: She is well-developed. She is not diaphoretic.  HENT:     Right Ear: External ear normal.     Left Ear: External ear normal.     Nose: Nose normal.  Eyes:     Conjunctiva/sclera: Conjunctivae normal.  Cardiovascular:     Rate and Rhythm: Normal rate and regular rhythm.     Heart sounds: No murmur heard. Pulmonary:     Effort: Pulmonary effort is normal. No respiratory distress.     Breath sounds: Normal breath sounds. No wheezing.  Musculoskeletal:     Cervical back: Neck supple.  Skin:    General: Skin is warm and dry.     Capillary Refill: Capillary refill takes less than 2 seconds.  Neurological:     Mental Status:  She is alert. Mental status is at baseline.  Psychiatric:        Mood and Affect: Mood normal.        Behavior: Behavior normal.           Assessment & Plan:   Problem List Items Addressed This Visit       Cardiovascular and Mediastinum   Essential hypertension    BP low normal and getting lightheaded. Stop losartan 25 mg. Update in 2 weeks with home readings.         Endocrine   Controlled diabetes mellitus type 2 with complications ([place])    Lab Results  Component Value Date   HGBA1C 6.3 (A) 02/25/2022  Controlled, however with low CBG. Decrease metfromin from 1000 mg daily to 500 mg daily. Update if lows do no improve or if high cbg.       Relevant Medications   metFORMIN (GLUCOPHAGE-XR) 500 MG 24 hr tablet   Other Relevant Orders   POCT glycosylated hemoglobin (Hb A1C) (Completed)   Microalbumin / creatinine urine ratio     Return in about 3 months (around 05/27/2022) for TOC  with Matt - DM.  Lynnda Child, MD

## 2022-05-13 DIAGNOSIS — E119 Type 2 diabetes mellitus without complications: Secondary | ICD-10-CM | POA: Diagnosis not present

## 2022-05-13 DIAGNOSIS — H40013 Open angle with borderline findings, low risk, bilateral: Secondary | ICD-10-CM | POA: Diagnosis not present

## 2022-05-13 DIAGNOSIS — H5213 Myopia, bilateral: Secondary | ICD-10-CM | POA: Diagnosis not present

## 2022-05-27 ENCOUNTER — Encounter: Payer: BC Managed Care – PPO | Admitting: Nurse Practitioner

## 2022-06-05 ENCOUNTER — Encounter (HOSPITAL_COMMUNITY): Payer: Self-pay | Admitting: Anesthesiology

## 2022-06-05 ENCOUNTER — Emergency Department (HOSPITAL_COMMUNITY): Payer: BC Managed Care – PPO | Admitting: Anesthesiology

## 2022-06-05 ENCOUNTER — Emergency Department (HOSPITAL_COMMUNITY): Payer: BC Managed Care – PPO

## 2022-06-05 ENCOUNTER — Ambulatory Visit (HOSPITAL_COMMUNITY)
Admission: EM | Admit: 2022-06-05 | Discharge: 2022-06-06 | Disposition: A | Discharge status: Home / Self Care | Payer: BC Managed Care – PPO | Attending: Urology | Admitting: Urology

## 2022-06-05 ENCOUNTER — Encounter (HOSPITAL_COMMUNITY)
Admission: EM | Disposition: A | Discharge status: Home / Self Care | Payer: Self-pay | Source: Home / Self Care | Attending: Emergency Medicine

## 2022-06-05 DIAGNOSIS — R112 Nausea with vomiting, unspecified: Secondary | ICD-10-CM | POA: Diagnosis not present

## 2022-06-05 DIAGNOSIS — E1136 Type 2 diabetes mellitus with diabetic cataract: Secondary | ICD-10-CM | POA: Insufficient documentation

## 2022-06-05 DIAGNOSIS — N136 Pyonephrosis: Secondary | ICD-10-CM | POA: Diagnosis not present

## 2022-06-05 DIAGNOSIS — N200 Calculus of kidney: Secondary | ICD-10-CM | POA: Diagnosis not present

## 2022-06-05 DIAGNOSIS — N12 Tubulo-interstitial nephritis, not specified as acute or chronic: Secondary | ICD-10-CM

## 2022-06-05 DIAGNOSIS — I10 Essential (primary) hypertension: Secondary | ICD-10-CM | POA: Insufficient documentation

## 2022-06-05 DIAGNOSIS — R319 Hematuria, unspecified: Secondary | ICD-10-CM | POA: Diagnosis not present

## 2022-06-05 DIAGNOSIS — Z9049 Acquired absence of other specified parts of digestive tract: Secondary | ICD-10-CM | POA: Diagnosis not present

## 2022-06-05 DIAGNOSIS — Z435 Encounter for attention to cystostomy: Secondary | ICD-10-CM | POA: Diagnosis not present

## 2022-06-05 DIAGNOSIS — N10 Acute tubulo-interstitial nephritis: Secondary | ICD-10-CM | POA: Diagnosis not present

## 2022-06-05 DIAGNOSIS — N132 Hydronephrosis with renal and ureteral calculous obstruction: Secondary | ICD-10-CM | POA: Diagnosis not present

## 2022-06-05 DIAGNOSIS — N202 Calculus of kidney with calculus of ureter: Secondary | ICD-10-CM | POA: Diagnosis not present

## 2022-06-05 DIAGNOSIS — N201 Calculus of ureter: Secondary | ICD-10-CM | POA: Diagnosis not present

## 2022-06-05 DIAGNOSIS — R509 Fever, unspecified: Secondary | ICD-10-CM | POA: Diagnosis not present

## 2022-06-05 HISTORY — PX: CYSTOSCOPY W/ URETERAL STENT PLACEMENT: SHX1429

## 2022-06-05 LAB — CBC WITH DIFFERENTIAL/PLATELET
Abs Immature Granulocytes: 0.09 10*3/uL — ABNORMAL HIGH (ref 0.00–0.07)
Basophils Absolute: 0 10*3/uL (ref 0.0–0.1)
Basophils Relative: 0 %
Eosinophils Absolute: 0 10*3/uL (ref 0.0–0.5)
Eosinophils Relative: 0 %
HCT: 38.5 % (ref 36.0–46.0)
Hemoglobin: 13.1 g/dL (ref 12.0–15.0)
Immature Granulocytes: 1 %
Lymphocytes Relative: 5 %
Lymphs Abs: 0.7 10*3/uL (ref 0.7–4.0)
MCH: 30.3 pg (ref 26.0–34.0)
MCHC: 34 g/dL (ref 30.0–36.0)
MCV: 89.1 fL (ref 80.0–100.0)
Monocytes Absolute: 1.3 10*3/uL — ABNORMAL HIGH (ref 0.1–1.0)
Monocytes Relative: 10 %
Neutro Abs: 11.7 10*3/uL — ABNORMAL HIGH (ref 1.7–7.7)
Neutrophils Relative %: 84 %
Platelets: 178 10*3/uL (ref 150–400)
RBC: 4.32 MIL/uL (ref 3.87–5.11)
RDW: 12.4 % (ref 11.5–15.5)
WBC: 13.9 10*3/uL — ABNORMAL HIGH (ref 4.0–10.5)
nRBC: 0 % (ref 0.0–0.2)

## 2022-06-05 LAB — I-STAT BETA HCG BLOOD, ED (MC, WL, AP ONLY): I-stat hCG, quantitative: <5 m[IU]/mL (ref <5)

## 2022-06-05 LAB — COMPREHENSIVE METABOLIC PANEL
ALT: 21 U/L (ref 0–44)
AST: 20 U/L (ref 15–41)
Albumin: 3.2 g/dL — ABNORMAL LOW (ref 3.5–5.0)
Alkaline Phosphatase: 68 U/L (ref 38–126)
Anion gap: 12 (ref 5–15)
BUN: 10 mg/dL (ref 6–20)
CO2: 25 mmol/L (ref 22–32)
Calcium: 8.7 mg/dL — ABNORMAL LOW (ref 8.9–10.3)
Chloride: 96 mmol/L — ABNORMAL LOW (ref 98–111)
Creatinine, Ser: 0.68 mg/dL (ref 0.44–1.00)
GFR, Estimated: >60 mL/min (ref >60)
Glucose, Bld: 146 mg/dL — ABNORMAL HIGH (ref 70–99)
Potassium: 3.5 mmol/L (ref 3.5–5.1)
Sodium: 133 mmol/L — ABNORMAL LOW (ref 135–145)
Total Bilirubin: 0.6 mg/dL (ref 0.3–1.2)
Total Protein: 7.1 g/dL (ref 6.5–8.1)

## 2022-06-05 LAB — URINALYSIS, ROUTINE W REFLEX MICROSCOPIC
Bilirubin Urine: NEGATIVE
Glucose, UA: NEGATIVE mg/dL
Ketones, ur: 20 mg/dL — AB
Nitrite: POSITIVE — AB
Protein, ur: 100 mg/dL — AB
Specific Gravity, Urine: 1.021 (ref 1.005–1.030)
WBC, UA: >50 WBC/hpf — ABNORMAL HIGH (ref 0–5)
pH: 5 (ref 5.0–8.0)

## 2022-06-05 LAB — LIPASE, BLOOD: Lipase: 29 U/L (ref 11–51)

## 2022-06-05 SURGERY — CYSTOSCOPY, WITH RETROGRADE PYELOGRAM AND URETERAL STENT INSERTION
Anesthesia: General | Site: Ureter | Laterality: Left

## 2022-06-05 MED ORDER — PROPOFOL 10 MG/ML IV BOLUS
INTRAVENOUS | Status: DC | PRN
  Administered 2022-06-05: 160 mg via INTRAVENOUS

## 2022-06-05 MED ORDER — MIDAZOLAM HCL 2 MG/2ML IJ SOLN
INTRAMUSCULAR | Status: AC
  Filled 2022-06-05: qty 2

## 2022-06-05 MED ORDER — PROPOFOL 10 MG/ML IV BOLUS
INTRAVENOUS | Status: AC
  Filled 2022-06-05: qty 20

## 2022-06-05 MED ORDER — MIDAZOLAM HCL 5 MG/5ML IJ SOLN
INTRAMUSCULAR | Status: DC | PRN
  Administered 2022-06-05: 2 mg via INTRAVENOUS

## 2022-06-05 MED ORDER — SODIUM CHLORIDE 0.9 % IV BOLUS
1000.0000 mL | Freq: Once | INTRAVENOUS | Status: AC
  Administered 2022-06-05: 1000 mL via INTRAVENOUS

## 2022-06-05 MED ORDER — DIPHENHYDRAMINE HCL 50 MG/ML IJ SOLN
12.5000 mg | Freq: Once | INTRAMUSCULAR | Status: AC
  Administered 2022-06-05: 12.5 mg via INTRAVENOUS
  Filled 2022-06-05: qty 1

## 2022-06-05 MED ORDER — MORPHINE SULFATE (PF) 2 MG/ML IV SOLN
2.0000 mg | Freq: Once | INTRAVENOUS | Status: AC
  Administered 2022-06-05: 2 mg via INTRAVENOUS
  Filled 2022-06-05: qty 1

## 2022-06-05 MED ORDER — PHENYLEPHRINE HCL (PRESSORS) 10 MG/ML IV SOLN
INTRAVENOUS | Status: DC | PRN
  Administered 2022-06-05: 160 ug via INTRAVENOUS

## 2022-06-05 MED ORDER — IBUPROFEN 400 MG PO TABS: 600.0000 mg | ORAL_TABLET | Freq: Once | ORAL | Status: DC

## 2022-06-05 MED ORDER — FENTANYL CITRATE (PF) 250 MCG/5ML IJ SOLN
INTRAMUSCULAR | Status: AC
  Filled 2022-06-05: qty 5

## 2022-06-05 MED ORDER — FENTANYL CITRATE (PF) 100 MCG/2ML IJ SOLN
INTRAMUSCULAR | Status: DC | PRN
  Administered 2022-06-05: 100 ug via INTRAVENOUS
  Administered 2022-06-06: 50 ug via INTRAVENOUS

## 2022-06-05 MED ORDER — ACETAMINOPHEN 325 MG PO TABS
650.0000 mg | ORAL_TABLET | Freq: Once | ORAL | Status: DC
  Filled 2022-06-05: qty 2

## 2022-06-05 MED ORDER — LIDOCAINE HCL (CARDIAC) PF 50 MG/5ML IV SOSY
PREFILLED_SYRINGE | INTRAVENOUS | Status: DC | PRN
  Administered 2022-06-05: 60 mg via INTRAVENOUS

## 2022-06-05 MED ORDER — ONDANSETRON HCL 4 MG/2ML IJ SOLN
4.0000 mg | Freq: Once | INTRAMUSCULAR | Status: AC
  Administered 2022-06-05: 4 mg via INTRAVENOUS
  Filled 2022-06-05: qty 2

## 2022-06-05 MED ORDER — SODIUM CHLORIDE 0.9 % IV SOLN
1.0000 g | Freq: Once | INTRAVENOUS | Status: AC
  Administered 2022-06-05: 1 g via INTRAVENOUS
  Filled 2022-06-05: qty 10

## 2022-06-05 MED ORDER — SUCCINYLCHOLINE CHLORIDE 20 MG/ML IJ SOLN
INTRAMUSCULAR | Status: DC | PRN
  Administered 2022-06-05: 120 mg via INTRAVENOUS

## 2022-06-05 SURGICAL SUPPLY — 24 items
BAG DRN RND TRDRP ANRFLXCHMBR (UROLOGICAL SUPPLIES)
BAG URINE DRAIN 2000ML AR STRL (UROLOGICAL SUPPLIES) ×1 IMPLANT
BAG URO CATCHER STRL LF (MISCELLANEOUS) ×1 IMPLANT
CATH FOLEY 2WAY SLVR  5CC 16FR (CATHETERS)
CATH FOLEY 2WAY SLVR 5CC 16FR (CATHETERS) IMPLANT
CATH URET 5FR 28IN OPEN ENDED (CATHETERS) IMPLANT
CATH URETL OPEN END 6FR 70 (CATHETERS) ×1 IMPLANT
GLOVE BIO SURGEON STRL SZ8 (GLOVE) ×1 IMPLANT
GOWN STRL REUS W/ TWL LRG LVL3 (GOWN DISPOSABLE) ×1 IMPLANT
GOWN STRL REUS W/ TWL XL LVL3 (GOWN DISPOSABLE) ×1 IMPLANT
GOWN STRL REUS W/TWL LRG LVL3 (GOWN DISPOSABLE) ×1
GOWN STRL REUS W/TWL XL LVL3 (GOWN DISPOSABLE) ×1
GUIDEWIRE ANG ZIPWIRE 038X150 (WIRE) IMPLANT
GUIDEWIRE STR DUAL SENSOR (WIRE) ×1 IMPLANT
KIT TURNOVER KIT B (KITS) ×1 IMPLANT
MANIFOLD NEPTUNE II (INSTRUMENTS) IMPLANT
NS IRRIG 1000ML POUR BTL (IV SOLUTION) IMPLANT
PACK CYSTO (CUSTOM PROCEDURE TRAY) ×1 IMPLANT
STENT URET 6FRX24 CONTOUR (STENTS) IMPLANT
STENT URET 6FRX26 CONTOUR (STENTS) IMPLANT
SYPHON OMNI JUG (MISCELLANEOUS) ×1 IMPLANT
TOWEL GREEN STERILE FF (TOWEL DISPOSABLE) ×1 IMPLANT
TUBE CONNECTING 12X1/4 (SUCTIONS) IMPLANT
WATER STERILE IRR 3000ML UROMA (IV SOLUTION) ×1 IMPLANT

## 2022-06-05 NOTE — ED Triage Notes (Signed)
Pt to ED c/o flank pain, blood in urine and generalized weakness that started on Monday; progressively getting worse. Went to urgent care and was instructed to come to ED for further eval.

## 2022-06-05 NOTE — Consult Note (Signed)
I have been asked to see the patient by Dr. Octaviano Glow, for evaluation and management of left infected ureteral stone.  History of present illness: 41 year old female who presented to the emergency department with 3 days of progressively worsening left-sided flank pain.  She had fevers of 102.  She had some associated nausea and vomiting.  She presented to the emergency department where she had a workup for flank pain with concern for kidney stone.  She had a urine analysis that demonstrated nitrite positive urine.  Her white blood cell count was 13.9.  Her CT scan demonstrated a 5 mm stone at the left UPJ.  She was febrile in the ED but otherwise hemodynamically stable.  The patient has passed the kidney stone on her own in the past.  She is otherwise fairly healthy.  Review of systems: A 12 point comprehensive review of systems was obtained and is negative unless otherwise stated in the history of present illness.  Patient Active Problem List   Diagnosis Date Noted   Status post laparoscopic hernia repair 05/28/2020   Controlled diabetes mellitus type 2 with complications ([place]) 10/16/209   Numbness and tingling of right arm 04/08/2020   Hepatic steatosis 03/14/2020   Essential hypertension 03/06/2020   Abnormal uterine bleeding 08/14/2015   Dyspareunia in female 08/14/2015   PCOS (polycystic ovarian syndrome) 05/07/2015   Adnexal pain 11/08/2014   History of gestational diabetes 07/28/2013    No current facility-administered medications on file prior to encounter.   Current Outpatient Medications on File Prior to Encounter  Medication Sig Dispense Refill   Accu-Chek Softclix Lancets lancets Use to check blood sugar up to 2 times a day 100 each 12   Blood Glucose Monitoring Suppl (ACCU-CHEK AVIVA PLUS) w/Device KIT Use to check blood sugar up to 2 times a day 1 kit 0   glucose blood (ACCU-CHEK AVIVA PLUS) test strip Use to check blood sugar up to 2 times a day 100 each 12    metFORMIN (GLUCOPHAGE-XR) 500 MG 24 hr tablet Take 1 tablet (500 mg total) by mouth daily with breakfast. 90 tablet 1   SPRINTEC 28 0.25-35 MG-MCG tablet Take 1 tablet by mouth daily. 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 some SOB if over exerts herself since then.   Diabetes mellitus without complication ([place])    Gestational diabetes    Headache(784.0)    migraines d/t back problems radiating up her neck and down her arm on left   History of kidney stones    Hypertension    Polycystic ovarian syndrome 10/2014   UTI (lower urinary tract infection)    Yeast infection     Past Surgical History:  Procedure Laterality Date   CHOLECYSTECTOMY     HYSTEROSCOPY N/A 07/25/2015   Procedure: HYSTEROSCOPY;  Surgeon: Donnamae Jude, MD;  Location: [facility];  Service: Gynecology;  Laterality: N/A;   LAPAROSCOPY N/A 07/25/2015   Procedure: LAPAROSCOPY DIAGNOSTIC, POSTERIOR CUL DE [place] BIOPSY;  Surgeon: Donnamae Jude, MD;  Location: [facility];  Service: Gynecology;  Laterality: N/A;   TONSILLECTOMY     WISDOM TOOTH EXTRACTION     XI ROBOTIC ASSISTED VENTRAL HERNIA N/A 04/10/2020   Procedure: XI ROBOTIC ASSISTED VENTRAL HERNIA with mesh;  Surgeon: Ronny Bacon, MD;  Location:  [facility];  Service: General;  Laterality: N/A;    Social History   Tobacco Use   Smoking status: Never   Smokeless tobacco: Never  Vaping Use   Vaping Use: Never used  Substance Use Topics   Alcohol use: No   Drug use: No    Family History  Problem Relation Age of Onset   Hypertension Mother    Cancer Paternal Grandfather        brain tumor   Seizures Sister    Bipolar disorder Sister    Hypertension Maternal Grandmother    Alzheimer's disease Maternal Grandmother     PE: Vitals:   06/05/22 1627 06/05/22 2028 06/05/22 2200  BP: 138/72 (!) 105/54 118/66  Pulse: 83 68  74  Resp: _0 Temp: (!) 101.5 F (38.6 C) 98.3 F (36.8 C)   TempSrc:  Oral   SpO2: 97% 100% 100%   Patient appears to be in no acute distress  patient is alert and oriented x3 Atraumatic normocephalic head No cervical or supraclavicular lymphadenopathy appreciated No increased work of breathing, no audible wheezes/rhonchi Regular sinus rhythm/rate Abdomen is soft, nontender, nondistended, left CVA tenderness Lower extremities are symmetric without appreciable edema Grossly neurologically intact No identifiable skin lesions  Recent Labs    06/05/22 1642  WBC 13.9*  HGB 13.1  HCT 38.5   Recent Labs    06/05/22 1642  NA 133*  K 3.5  CL 96*  CO2 25  GLUCOSE 146*  BUN 10  CREATININE 0.68  CALCIUM 8.7*   No results for input(s): "LABPT", "INR" in the last 72 hours. No results for input(s): "LABURIN" in the last 72 hours. Results for orders placed or performed during the hospital encounter of 04/08/20  SARS CORONAVIRUS 2 (TAT 6-24 HRS) Nasopharyngeal Nasopharyngeal Swab     Status: None   Collection Time: 04/08/20 10:25 AM   Specimen: Nasopharyngeal Swab  Result Value Ref Range Status   SARS Coronavirus 2 NEGATIVE NEGATIVE Final    Comment: (NOTE) SARS-CoV-2 target nucleic acids are NOT DETECTED.  The SARS-CoV-2 RNA is generally detectable in upper and lower respiratory specimens during the acute phase of infection. Negative results do not preclude SARS-CoV-2 infection, do not rule out co-infections with other pathogens, and should not be used as the sole basis for treatment or other patient management decisions. Negative results must be combined with clinical observations, patient history, and epidemiological information. The expected result is Negative.  Fact Sheet for Patients: SugarRoll.be  Fact Sheet for Healthcare Providers: https://www.woods-mathews.com/  This test is not yet approved or cleared by the  [place] FDA and  has been authorized for detection and/or diagnosis of SARS-CoV-2 by FDA under an Emergency Use Authorization (EUA). This EUA will remain  in effect (meaning this test can be used) for the duration of the COVID-19 declaration under Se ction 564(b)(1) of the Act, 21 U.S.C. section 360bbb-3(b)(1), unless the authorization is terminated or revoked sooner.  Performed at [facility], [place] [address] [place], [place] [address]     Imaging: I have independently reviewed the patient's CT scan demonstrating a left ureteral stone in the proximal ureter.  Imp: Pyelonephritis with a left obstructing ureteral stone, otherwise hemodynamically stable.  Recommendations: I reviewed the clinical circumstance with the patient and discussed management strategies.  I recommend an urgent stent placement.  She will subsequently be discharged home with broad-spectrum antibiotics.  She will then need to follow-up for more definitive stone management.  I discussed the procedure with her including the risk and the benefits.  Having gone through all of this with the patient she is opted to proceed.   Ardis Hughs

## 2022-06-05 NOTE — ED Provider Notes (Signed)
[facility] EMERGENCY DEPARTMENT Provider Note   CSN: [id number] Arrival date & time: 06/05/22  1619     History  Chief Complaint  Patient presents with   Hematuria   Generalized Body Aches   Flank Pain    Amber Foster is a 41 y.o. female.  With past medical history of PCOS, diabetes, hypertension who presents to the emergency department with hematuria.  Patient states that her symptoms actually began on Monday night.  She describes having a dull pain in her lower abdomen on Monday night.  Describes feeling bloated and that her jeans even hurt to have laying over her abdomen.  She states that she figured this was her PCO symptoms but then on Tuesday began feeling worse with cramping.  She states on Wednesday she began having fever, hot and cold sweats, nausea and vomiting.  States that the symptoms have persisted yesterday and today.  She has had minimal p.o. intake.  She noted today that she began having hematuria.  She denies any dysuria or cough.  She was seen at urgent care and sent here for possible pyelonephritis and ongoing workup.  Hematuria  Flank Pain       Home Medications Prior to Admission medications   Medication Sig Start Date End Date Taking? Authorizing Provider  Accu-Chek Softclix Lancets lancets Use to check blood sugar up to 2 times a day 11/25/21   Waunita Schooner, MD  Blood Glucose Monitoring Suppl (ACCU-CHEK AVIVA PLUS) w/Device KIT Use to check blood sugar up to 2 times a day 11/25/21   Waunita Schooner, MD  glucose blood (ACCU-CHEK AVIVA PLUS) test strip Use to check blood sugar up to 2 times a day 11/25/21   Waunita Schooner, MD  metFORMIN (GLUCOPHAGE-XR) 500 MG 24 hr tablet Take 1 tablet (500 mg total) by mouth daily with breakfast. 02/25/22   Waunita Schooner, MD  [place] 28 0.25-35 MG-MCG tablet Take 1 tablet by mouth daily. 08/18/21   Waunita Schooner, MD      Allergies    Citric acid and Other    Review of Systems   Review of Systems   Constitutional:  Positive for appetite change, chills, diaphoresis and fever.  Genitourinary:  Positive for flank pain and hematuria.  All other systems reviewed and are negative.   Physical Exam Updated Vital Signs BP 118/66   Pulse 74   Temp 98.3 F (36.8 C) (Oral)   Resp 16   SpO2 100%  Physical Exam Vitals and nursing note reviewed.  Constitutional:      Appearance: Normal appearance. She is ill-appearing.  HENT:     Head: Normocephalic.  Eyes:     General: No scleral icterus.    Extraocular Movements: Extraocular movements intact.  Cardiovascular:     Rate and Rhythm: Normal rate and regular rhythm.     Pulses: Normal pulses.     Heart sounds: No murmur heard. Pulmonary:     Effort: Pulmonary effort is normal. No respiratory distress.     Breath sounds: Normal breath sounds.  Abdominal:     General: Bowel sounds are normal. There is distension.     Palpations: Abdomen is soft.     Tenderness: There is no abdominal tenderness. There is left CVA tenderness. There is no guarding.  Skin:    General: Skin is warm and dry.     Capillary Refill: Capillary refill takes less than 2 seconds.  Neurological:     General: No focal deficit present.  Mental Status: She is alert and oriented to person, place, and time. Mental status is at baseline.  Psychiatric:        Mood and Affect: Mood normal.        Behavior: Behavior normal.        Thought Content: Thought content normal.        Judgment: Judgment normal.     ED Results / Procedures / Treatments   Labs (all labs ordered are listed, but only abnormal results are displayed) Labs Reviewed  COMPREHENSIVE METABOLIC PANEL - Abnormal; Notable for the following components:      Result Value   Sodium 133 (*)    Chloride 96 (*)    Glucose, Bld 146 (*)    Calcium 8.7 (*)    Albumin 3.2 (*)    All other components within normal limits  CBC WITH DIFFERENTIAL/PLATELET - Abnormal; Notable for the following components:    WBC 13.9 (*)    Neutro Abs 11.7 (*)    Monocytes Absolute 1.3 (*)    Abs Immature Granulocytes 0.09 (*)    All other components within normal limits  URINALYSIS, ROUTINE W REFLEX MICROSCOPIC - Abnormal; Notable for the following components:   Color, Urine AMBER (*)    APPearance CLOUDY (*)    Hgb urine dipstick LARGE (*)    Ketones, ur 20 (*)    Protein, ur 100 (*)    Nitrite POSITIVE (*)    Leukocytes,Ua SMALL (*)    WBC, UA >50 (*)    Bacteria, UA MANY (*)    All other components within normal limits  CULTURE, BLOOD (ROUTINE X 2)  CULTURE, BLOOD (ROUTINE X 2)  LIPASE, BLOOD  I-STAT BETA HCG BLOOD, ED ([facility], [facility], [facility] ONLY)    EKG None  Radiology CT Renal Stone Study  Result Date: 06/05/2022 CLINICAL DATA:  Abdominal/flank pain, stone suspected. EXAM: CT ABDOMEN AND PELVIS WITHOUT CONTRAST TECHNIQUE: Multidetector CT imaging of the abdomen and pelvis was performed following the standard protocol without IV contrast. RADIATION DOSE REDUCTION: This exam was performed according to the departmental dose-optimization program which includes automated exposure control, adjustment of the mA and/or kV according to patient size and/or use of iterative reconstruction technique. COMPARISON:  CT March 13, 2020 FINDINGS: Lower chest: No acute abnormality. Hepatobiliary: Unremarkable noncontrast enhanced appearance of the hepatic parenchyma. Gallbladder surgically absent. No biliary ductal dilation. Pancreas: No pancreatic ductal dilation or evidence of acute inflammation. Spleen: No splenomegaly. Adrenals/Urinary Tract: Bilateral adrenal glands appear normal. Mild left hydronephrosis with a 5 mm stone at the ureteropelvic junction. No right-sided hydronephrosis or nephrolithiasis. Urinary bladder is minimally distended limiting evaluation. Stomach/Bowel: Stomach is unremarkable for degree of distension. No pathologic dilation of small or large bowel. No evidence of acute bowel inflammation.  Vascular/Lymphatic: Normal caliber abdominal aorta. No pathologically enlarged abdominal or pelvic lymph nodes. Reproductive: Left adnexal lesion measuring 2.3 cm demonstrates some internal complexity favored to reflect a hemorrhagic cyst but incompletely evaluated on this study. Right adnexa and uterus are unremarkable in noncontrast enhanced CT appearance. Other: Trace pelvic free fluid is within physiologic normal limits. Musculoskeletal: No acute osseous abnormality. IMPRESSION: 1. Mild left hydronephrosis with a 5 mm stone at the ureteropelvic junction. 2. Left adnexal lesion measuring 2.3 cm demonstrates some internal complexity favored to reflect a hemorrhagic cyst but incompletely evaluated on this study. Suggest further evaluation with nonemergent pelvic ultrasound. Electronically Signed   By: Dahlia Bailiff M.D.   On: 06/05/2022 17:50  Procedures Procedures   Medications Ordered in ED Medications  acetaminophen (TYLENOL) tablet 650 mg (650 mg Oral Not Given 06/05/22 1659)  cefTRIAXone (ROCEPHIN) 1 g in sodium chloride 0.9 % 100 mL IVPB (1 g Intravenous New Bag/Given 06/05/22 2202)  ibuprofen (ADVIL) tablet 600 mg (has no administration in time range)  sodium chloride 0.9 % bolus 1,000 mL (1,000 mLs Intravenous New Bag/Given 06/05/22 2201)    ED Course/ Medical Decision Making/ A&P Clinical Course as of 06/05/22 2212  Fri Jun 05, 2022  2206 Consulted and spoke with Dr. Louis Meckel, urology who will take patient to OR for stent placement and dc from there.  [LA]    Clinical Course User Index [LA] Mickie Hillier, PA-C                           Medical Decision Making Initial Impression and Ddx 41 year old female who presents to the emergency department for fever, hematuria. Ill appearing on exam. Febrile on presentation. She is generally uncomfortable and has mild left CVAT. She had workup initiated in triage concerning for infected stone. Will start her on IV fluids, IV Rocephin.  I am  going obtain blood cultures given her presentation.  Will also consult with urology. Patient PMH that increases complexity of ED encounter: PCOS, hypertension, previous stones.  Interpretation of Diagnostics I independent reviewed and interpreted the labs as followed: UA with UTI and hemoglobin.  Creatinine within normal limits  - I independently visualized the following imaging with scope of interpretation limited to determining acute life threatening conditions related to emergency care: CT renal stone study, which revealed 5 mm left UPJ stone with mild left hydro-.  Patient Reassessment and Ultimate Disposition/Management Patient had workup completed at the time that she presented to me.  Notably she has a white count of about 14, UTI, renal stone study with 5 mm left UPJ stone with mild left hydro-. Given that she has concerns of infected stone, starting IV fluids and antibiotics.  Also obtaining blood cultures as she has had multiple days of fever, chills, nausea and vomiting.  I consulted and spoke with Dr. Louis Meckel, urology is going to take the patient to the OR for stent placement and will discharge the patient from there.  The patient is agreeable to this.  Do not feel that the patient symptoms are related to other etiologies abdominal pain and fever such as appendicitis, pancreatitis (negative lipase), PUD, gastritis, gastroenteritis, diverticulitis, Crohn's, UC, vascular catastrophe, bowel obstruction.  Not pregnant so doubt ectopic.  No vaginal symptoms concerning for PID, TOA.  Patient management required discussion with the following services or consulting groups:  Urology  Complexity of Problems Addressed Acute illness or injury that poses threat of life of bodily function  Additional Data Reviewed and Analyzed Further history obtained from: Further history from spouse/family member, Past medical history and medications listed in the EMR, Care Everywhere, and Prior labs/imaging  results  Patient Encounter Risk Assessment Prescriptions, Consideration of hospitalization, and Major procedures  Final Clinical Impression(s) / ED Diagnoses Final diagnoses:  Kidney stone on left side  Pyelonephritis    Rx / DC Orders ED Discharge Orders     None         Mickie Hillier, PA-C 06/05/22 2226    Wyvonnia Dusky, MD 06/05/22 2321

## 2022-06-05 NOTE — ED Provider Triage Note (Signed)
Emergency Medicine Provider Triage Evaluation Note  Amber Foster , a 41 y.o. female  was evaluated in triage.  Pt complains of left flank pain. States that same has been ongoing since Monday. Endorses associated hematuria with nausea and vomiting. She states that she went to urgent care and had temp 102 and was sent here with concern for pyelonephritis. Patient denies any dysuria.   Review of Systems  Positive:  Negative:   Physical Exam  BP 138/72 (BP Location: Right Arm)   Pulse 83   Temp (!) 101.5 F (38.6 C)   Resp 20   SpO2 97%  Gen:   Awake, no distress   Resp:  Normal effort  MSK:   Moves extremities without difficulty  Other:  + CVA tenderness  Medical Decision Making  Medically screening exam initiated at 4:41 PM.  Appropriate orders placed.  Amber Foster was informed that the remainder of the evaluation will be completed by another provider, this initial triage assessment does not replace that evaluation, and the importance of remaining in the ED until their evaluation is complete.     Amber Bandy, PA-C 06/05/22 1643

## 2022-06-05 NOTE — Anesthesia Preprocedure Evaluation (Addendum)
Anesthesia Evaluation  Patient identified by MRN, date of birth, ID band Patient awake    Reviewed: Allergy & Precautions, NPO status , Patient's Chart, lab work & pertinent test results  Airway Mallampati: II  TM Distance: >3 FB Neck ROM: Full    Dental no notable dental hx. (+) Teeth Intact, Dental Advisory Given   Pulmonary neg pulmonary ROS   Pulmonary exam normal        Cardiovascular hypertension,  Rhythm:Regular Rate:Normal     Neuro/Psych  Headaches  negative psych ROS   GI/Hepatic negative GI ROS, Neg liver ROS,,,  Endo/Other  diabetes (no oral hypoglycemics x3 days), Type 2, Oral Hypoglycemic Agents    Renal/GU Renal disease  negative genitourinary   Musculoskeletal negative musculoskeletal ROS (+)    Abdominal Normal abdominal exam  (+)   Peds  Hematology negative hematology ROS (+)   Anesthesia Other Findings   Reproductive/Obstetrics                             Anesthesia Physical Anesthesia Plan  ASA: 2  Anesthesia Plan: General   Post-op Pain Management:    Induction: Intravenous  PONV Risk Score and Plan: 3 and Ondansetron, Dexamethasone, Midazolam and Treatment may vary due to age or medical condition  Airway Management Planned: Mask and LMA  Additional Equipment: None  Intra-op Plan:   Post-operative Plan: Extubation in OR  Informed Consent: I have reviewed the patients History and Physical, chart, labs and discussed the procedure including the risks, benefits and alternatives for the proposed anesthesia with the patient or authorized representative who has indicated his/her understanding and acceptance.     Dental advisory given  Plan Discussed with: CRNA  Anesthesia Plan Comments:        Anesthesia Quick Evaluation

## 2022-06-05 NOTE — H&P (View-Only) (Signed)
I have been asked to see the patient by Dr. Octaviano Glow, for evaluation and management of left infected ureteral stone.  History of present illness: 41 year old female who presented to the emergency department with 3 days of progressively worsening left-sided flank pain.  She had fevers of 102.  She had some associated nausea and vomiting.  She presented to the emergency department where she had a workup for flank pain with concern for kidney stone.  She had a urine analysis that demonstrated nitrite positive urine.  Her white blood cell count was 13.9.  Her CT scan demonstrated a 5 mm stone at the left UPJ.  She was febrile in the ED but otherwise hemodynamically stable.  The patient has passed the kidney stone on her own in the past.  She is otherwise fairly healthy.  Review of systems: A 12 point comprehensive review of systems was obtained and is negative unless otherwise stated in the history of present illness.  Patient Active Problem List   Diagnosis Date Noted   Status post laparoscopic hernia repair 05/28/2020   Controlled diabetes mellitus type 2 with complications ([place]) 10/16/209   Numbness and tingling of right arm 04/08/2020   Hepatic steatosis 03/14/2020   Essential hypertension 03/06/2020   Abnormal uterine bleeding 08/14/2015   Dyspareunia in female 08/14/2015   PCOS (polycystic ovarian syndrome) 05/07/2015   Adnexal pain 11/08/2014   History of gestational diabetes 07/28/2013    No current facility-administered medications on file prior to encounter.   Current Outpatient Medications on File Prior to Encounter  Medication Sig Dispense Refill   Accu-Chek Softclix Lancets lancets Use to check blood sugar up to 2 times a day 100 each 12   Blood Glucose Monitoring Suppl (ACCU-CHEK AVIVA PLUS) w/Device KIT Use to check blood sugar up to 2 times a day 1 kit 0   glucose blood (ACCU-CHEK AVIVA PLUS) test strip Use to check blood sugar up to 2 times a day 100 each 12    metFORMIN (GLUCOPHAGE-XR) 500 MG 24 hr tablet Take 1 tablet (500 mg total) by mouth daily with breakfast. 90 tablet 1   [place] 28 0.25-35 MG-MCG tablet Take 1 tablet by mouth daily. 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 some SOB if over exerts herself since then.   Diabetes mellitus without complication ([place])    Gestational diabetes    Headache(784.0)    migraines d/t back problems radiating up her neck and down her arm on left   History of kidney stones    Hypertension    Polycystic ovarian syndrome 10/2014   UTI (lower urinary tract infection)    Yeast infection     Past Surgical History:  Procedure Laterality Date   CHOLECYSTECTOMY     HYSTEROSCOPY N/A 07/25/2015   Procedure: HYSTEROSCOPY;  Surgeon: Donnamae Jude, MD;  Location: [facility];  Service: Gynecology;  Laterality: N/A;   LAPAROSCOPY N/A 07/25/2015   Procedure: LAPAROSCOPY DIAGNOSTIC, POSTERIOR CUL DE [place] BIOPSY;  Surgeon: Donnamae Jude, MD;  Location: [facility];  Service: Gynecology;  Laterality: N/A;   TONSILLECTOMY     WISDOM TOOTH EXTRACTION     XI ROBOTIC ASSISTED VENTRAL HERNIA N/A 04/10/2020   Procedure: XI ROBOTIC ASSISTED VENTRAL HERNIA with mesh;  Surgeon: Ronny Bacon, MD;  Location:  [facility];  Service: General;  Laterality: N/A;    Social History   Tobacco Use   Smoking status: Never   Smokeless tobacco: Never  Vaping Use   Vaping Use: Never used  Substance Use Topics   Alcohol use: No   Drug use: No    Family History  Problem Relation Age of Onset   Hypertension Mother    Cancer Paternal Grandfather        brain tumor   Seizures Sister    Bipolar disorder Sister    Hypertension Maternal Grandmother    Alzheimer's disease Maternal Grandmother     PE: Vitals:   06/05/22 1627 06/05/22 2028 06/05/22 2200  BP: 138/72 (!) 105/54 118/66  Pulse: 83 68  74  Resp: _0 Temp: (!) 101.5 F (38.6 C) 98.3 F (36.8 C)   TempSrc:  Oral   SpO2: 97% 100% 100%   Patient appears to be in no acute distress  patient is alert and oriented x3 Atraumatic normocephalic head No cervical or supraclavicular lymphadenopathy appreciated No increased work of breathing, no audible wheezes/rhonchi Regular sinus rhythm/rate Abdomen is soft, nontender, nondistended, left CVA tenderness Lower extremities are symmetric without appreciable edema Grossly neurologically intact No identifiable skin lesions  Recent Labs    06/05/22 1642  WBC 13.9*  HGB 13.1  HCT 38.5   Recent Labs    06/05/22 1642  NA 133*  K 3.5  CL 96*  CO2 25  GLUCOSE 146*  BUN 10  CREATININE 0.68  CALCIUM 8.7*   No results for input(s): "LABPT", "INR" in the last 72 hours. No results for input(s): "LABURIN" in the last 72 hours. Results for orders placed or performed during the hospital encounter of 04/08/20  SARS CORONAVIRUS 2 (TAT 6-24 HRS) Nasopharyngeal Nasopharyngeal Swab     Status: None   Collection Time: 04/08/20 10:25 AM   Specimen: Nasopharyngeal Swab  Result Value Ref Range Status   SARS Coronavirus 2 NEGATIVE NEGATIVE Final    Comment: (NOTE) SARS-CoV-2 target nucleic acids are NOT DETECTED.  The SARS-CoV-2 RNA is generally detectable in upper and lower respiratory specimens during the acute phase of infection. Negative results do not preclude SARS-CoV-2 infection, do not rule out co-infections with other pathogens, and should not be used as the sole basis for treatment or other patient management decisions. Negative results must be combined with clinical observations, patient history, and epidemiological information. The expected result is Negative.  Fact Sheet for Patients: SugarRoll.be  Fact Sheet for Healthcare Providers: https://www.woods-mathews.com/  This test is not yet approved or cleared by the  [place] FDA and  has been authorized for detection and/or diagnosis of SARS-CoV-2 by FDA under an Emergency Use Authorization (EUA). This EUA will remain  in effect (meaning this test can be used) for the duration of the COVID-19 declaration under Se ction 564(b)(1) of the Act, 21 U.S.C. section 360bbb-3(b)(1), unless the authorization is terminated or revoked sooner.  Performed at [facility], [place] [address] [place], [place] [address]     Imaging: I have independently reviewed the patient's CT scan demonstrating a left ureteral stone in the proximal ureter.  Imp: Pyelonephritis with a left obstructing ureteral stone, otherwise hemodynamically stable.  Recommendations: I reviewed the clinical circumstance with the patient and discussed management strategies.  I recommend an urgent stent placement.  She will subsequently be discharged home with broad-spectrum antibiotics.  She will then need to follow-up for more definitive stone management.  I discussed the procedure with her including the risk and the benefits.  Having gone through all of this with the patient she is opted to proceed.   Ardis Hughs

## 2022-06-06 DIAGNOSIS — Z435 Encounter for attention to cystostomy: Secondary | ICD-10-CM | POA: Diagnosis not present

## 2022-06-06 DIAGNOSIS — N201 Calculus of ureter: Secondary | ICD-10-CM | POA: Diagnosis not present

## 2022-06-06 LAB — GLUCOSE, CAPILLARY: Glucose-Capillary: 140 mg/dL — ABNORMAL HIGH (ref 70–99)

## 2022-06-06 MED ORDER — TRAMADOL HCL 50 MG PO TABS: 50.0000 mg | ORAL_TABLET | Freq: Four times a day (QID) | ORAL | 0 refills | Status: DC | PRN

## 2022-06-06 MED ORDER — IOHEXOL 300 MG/ML  SOLN
INTRAMUSCULAR | Status: DC | PRN
  Administered 2022-06-05: 300 mg via URETHRAL

## 2022-06-06 MED ORDER — PROPOFOL 10 MG/ML IV BOLUS
INTRAVENOUS | Status: AC
  Filled 2022-06-06: qty 20

## 2022-06-06 MED ORDER — FENTANYL CITRATE (PF) 100 MCG/2ML IJ SOLN: 25.0000 ug | INTRAMUSCULAR | Status: DC | PRN

## 2022-06-06 MED ORDER — STERILE WATER FOR IRRIGATION IR SOLN
Status: DC | PRN
  Administered 2022-06-05: 1000 mL

## 2022-06-06 MED ORDER — DEXAMETHASONE SODIUM PHOSPHATE 4 MG/ML IJ SOLN
INTRAMUSCULAR | Status: DC | PRN
  Administered 2022-06-06: 5 mg via INTRAVENOUS

## 2022-06-06 MED ORDER — ACETAMINOPHEN 10 MG/ML IV SOLN
INTRAVENOUS | Status: AC
  Filled 2022-06-06: qty 100

## 2022-06-06 MED ORDER — ACETAMINOPHEN 10 MG/ML IV SOLN
1000.0000 mg | Freq: Once | INTRAVENOUS | Status: DC | PRN
  Administered 2022-06-06: 1000 mg via INTRAVENOUS

## 2022-06-06 MED ORDER — ONDANSETRON HCL 4 MG/2ML IJ SOLN
INTRAMUSCULAR | Status: DC | PRN
  Administered 2022-06-06: 4 mg via INTRAVENOUS

## 2022-06-06 MED ORDER — PHENAZOPYRIDINE HCL 200 MG PO TABS: 200.0000 mg | ORAL_TABLET | Freq: Three times a day (TID) | ORAL | 0 refills | Status: DC | PRN

## 2022-06-06 MED ORDER — LACTATED RINGERS IV SOLN: INTRAVENOUS | Status: DC | PRN

## 2022-06-06 MED ORDER — CIPROFLOXACIN HCL 500 MG PO TABS: 500.0000 mg | ORAL_TABLET | Freq: Two times a day (BID) | ORAL | 0 refills | Status: AC

## 2022-06-06 MED ORDER — WATER FOR IRRIGATION, STERILE IR SOLN
Status: DC | PRN
  Administered 2022-06-06: 3000 mL via URETERAL

## 2022-06-06 NOTE — Discharge Instructions (Signed)
DISCHARGE INSTRUCTIONS FOR KIDNEY STONE/URETERAL STENT   MEDICATIONS:  1.  Resume all your other meds from home - except do not take any extra narcotic pain meds that you may have at home.  2. Pyridium is to help with the burning/stinging when you urinate. 3. Tramadol is for moderate/severe pain, otherwise taking upto 1000 mg every 6 hours of plainTylenol will help treat your pain.   4. Take Cipro as prescribed for 10 days.  ACTIVITY:  1. No strenuous activity x 1week  2. No driving while on narcotic pain medications  3. Drink plenty of water  4. Continue to walk at home - you can still get blood clots when you are at home, so keep active, but don't over do it.  5. May return to work/school tomorrow or when you feel ready   BATHING:  1. You can shower and we recommend daily showers  2. You have a string coming from your urethra: The stent string is attached to your ureteral stent. Do not pull on this.   SIGNS/SYMPTOMS TO CALL:  Please call us if you have a fever greater than 101.5, uncontrolled nausea/vomiting, uncontrolled pain, dizziness, unable to urinate, bloody urine, chest pain, shortness of breath, leg swelling, leg pain, redness around wound, drainage from wound, or any other concerns or questions.   You can reach [place] at [phone].   FOLLOW-UP:  1. You will be contacted for stone removal by Dr. Jasmine Awe surgery scheduler.

## 2022-06-06 NOTE — Op Note (Signed)
Preoperative diagnosis:  Left infected ureteral stone   Postoperative diagnosis:  same   Procedure:  Cystoscopy left ureteral stent placement left retrograde pyelography with interpretation   Surgeon: Crist Fat, MD  Anesthesia: General  Complications: None  Intraoperative findings:  left retrograde pyelography demonstrated a filling defect within the left ureter consistent with the patient's known calculus without other abnormalities.  EBL: Minimal  Specimens: urine culture  Indication: Amber Foster is a 40 y.o. patient with fever and an obstructing left ureteral stone. After reviewing the management options for treatment, he elected to proceed with the above surgical procedure(s). We have discussed the potential benefits and risks of the procedure, side effects of the proposed treatment, the likelihood of the patient achieving the goals of the procedure, and any potential problems that might occur during the procedure or recuperation. Informed consent has been obtained.  Description of procedure:  The patient was taken to the operating room and general anesthesia was induced.  The patient was placed in the dorsal lithotomy position, prepped and draped in the usual sterile fashion, and preoperative antibiotics were administered. A preoperative time-out was performed.   Cystourethroscopy was performed.  The patient's urethra was examined and was normal. The bladder was then systematically examined in its entirety. There was no evidence for any bladder tumors, stones, or other mucosal pathology.    Attention then turned to the leftureteral orifice and a ureteral catheter was used to intubate the ureteral orifice.  Omnipaque contrast was injected through the ureteral catheter and a retrograde pyelogram was performed with findings as dictated above.  A 0.38 sensor guidewire was then advanced up the left ureter into the renal pelvis under fluoroscopic guidance.  The wire was  then backloaded through the cystoscope and a ureteral stent was advance over the wire using Seldinger technique.  The stent was positioned appropriately under fluoroscopic and cystoscopic guidance.  The wire was then removed with an adequate stent curl noted in the renal pelvis as well as in the bladder.  The bladder was then emptied and the procedure ended.  The patient appeared to tolerate the procedure well and without complications.  The patient was able to be awakened and transferred to the recovery unit in satisfactory condition.    Crist Fat, M.D.

## 2022-06-06 NOTE — Anesthesia Procedure Notes (Addendum)
Procedure Name: Intubation Date/Time: 06/05/2022 11:55 PM  Performed by: Edmonia Caprio, CRNAPre-anesthesia Checklist: Patient identified, Emergency Drugs available, Suction available, Patient being monitored and Timeout performed Patient Re-evaluated:Patient Re-evaluated prior to induction Oxygen Delivery Method: Circle system utilized Preoxygenation: Pre-oxygenation with 100% oxygen Induction Type: IV induction, Rapid sequence and Cricoid Pressure applied Laryngoscope Size: Miller and 2 Grade View: Grade I Tube type: Oral Tube size: 7.5 mm Number of attempts: 1 Airway Equipment and Method: Stylet Placement Confirmation: positive ETCO2, ETT inserted through vocal cords under direct vision and breath sounds checked- equal and bilateral Secured at: 23 cm Tube secured with: Tape Dental Injury: Teeth and Oropharynx as per pre-operative assessment

## 2022-06-06 NOTE — Transfer of Care (Signed)
Immediate Anesthesia Transfer of Care Note  Patient: Amber Foster  Procedure(s) Performed: CYSTOSCOPY WITH RETROGRADE PYELOGRAM/URETERAL STENT PLACEMENT (Left)  Patient Location: PACU  Anesthesia Type:General  Level of Consciousness: awake, alert , and oriented  Airway & Oxygen Therapy: Patient Spontanous Breathing  Post-op Assessment: Report given to RN and Post -op Vital signs reviewed and stable  Post vital signs: Reviewed and stable  Last Vitals:  Vitals Value Taken Time  BP 117/46 06/06/22 0045  Temp    Pulse 85 06/06/22 0049  Resp 12 06/06/22 0049  SpO2 90 % 06/06/22 0049  Vitals shown include unvalidated device data.  Last Pain:  Vitals:   06/05/22 2208  TempSrc:   PainSc: 7          Complications: No notable events documented.

## 2022-06-07 LAB — URINE CULTURE: Culture: <10000 — AB

## 2022-06-07 NOTE — Anesthesia Postprocedure Evaluation (Signed)
Anesthesia Post Note  Patient: Amber Foster  Procedure(s) Performed: CYSTOSCOPY WITH RETROGRADE PYELOGRAM/URETERAL STENT PLACEMENT (Left: Ureter)     Patient location during evaluation: PACU Anesthesia Type: General Level of consciousness: awake and alert Pain management: pain level controlled Vital Signs Assessment: post-procedure vital signs reviewed and stable Respiratory status: spontaneous breathing, nonlabored ventilation, respiratory function stable and patient connected to nasal cannula oxygen Cardiovascular status: blood pressure returned to baseline and stable Postop Assessment: no apparent nausea or vomiting Anesthetic complications: no   No notable events documented.  Last Vitals:  Vitals:   06/06/22 0115 06/06/22 0130  BP: 110/75 125/75  Pulse: 78 75  Resp: 15 16  Temp:  37.5 C  SpO2: 95% 95%    Last Pain:  Vitals:   06/06/22 0115  TempSrc:   PainSc: 0-No pain                 Earl Lites P Grace Haggart

## 2022-06-08 ENCOUNTER — Encounter (HOSPITAL_COMMUNITY): Payer: Self-pay | Admitting: Urology

## 2022-06-10 ENCOUNTER — Other Ambulatory Visit: Payer: Self-pay | Admitting: Urology

## 2022-06-10 LAB — CULTURE, BLOOD (ROUTINE X 2)
Culture: NO GROWTH
Culture: NO GROWTH
Special Requests: ADEQUATE
Special Requests: ADEQUATE

## 2022-06-11 LAB — AEROBIC/ANAEROBIC CULTURE W GRAM STAIN (SURGICAL/DEEP WOUND)

## 2022-06-16 ENCOUNTER — Emergency Department: Payer: BC Managed Care – PPO

## 2022-06-16 ENCOUNTER — Other Ambulatory Visit: Payer: Self-pay

## 2022-06-16 ENCOUNTER — Emergency Department
Admission: EM | Admit: 2022-06-16 | Discharge: 2022-06-16 | Disposition: A | Discharge status: Home / Self Care | Payer: BC Managed Care – PPO | Attending: Emergency Medicine | Admitting: Emergency Medicine

## 2022-06-16 DIAGNOSIS — Z9049 Acquired absence of other specified parts of digestive tract: Secondary | ICD-10-CM | POA: Diagnosis not present

## 2022-06-16 DIAGNOSIS — R319 Hematuria, unspecified: Secondary | ICD-10-CM | POA: Diagnosis not present

## 2022-06-16 DIAGNOSIS — N200 Calculus of kidney: Secondary | ICD-10-CM

## 2022-06-16 DIAGNOSIS — N132 Hydronephrosis with renal and ureteral calculous obstruction: Secondary | ICD-10-CM | POA: Diagnosis not present

## 2022-06-16 LAB — CBC WITH DIFFERENTIAL/PLATELET
Abs Immature Granulocytes: 0.05 10*3/uL (ref 0.00–0.07)
Basophils Absolute: 0 10*3/uL (ref 0.0–0.1)
Basophils Relative: 0 %
Eosinophils Absolute: 0.1 10*3/uL (ref 0.0–0.5)
Eosinophils Relative: 1 %
HCT: 39.9 % (ref 36.0–46.0)
Hemoglobin: 13.1 g/dL (ref 12.0–15.0)
Immature Granulocytes: 1 %
Lymphocytes Relative: 28 %
Lymphs Abs: 2.5 10*3/uL (ref 0.7–4.0)
MCH: 29.3 pg (ref 26.0–34.0)
MCHC: 32.8 g/dL (ref 30.0–36.0)
MCV: 89.3 fL (ref 80.0–100.0)
Monocytes Absolute: 0.4 10*3/uL (ref 0.1–1.0)
Monocytes Relative: 5 %
Neutro Abs: 6 10*3/uL (ref 1.7–7.7)
Neutrophils Relative %: 65 %
Platelets: 365 10*3/uL (ref 150–400)
RBC: 4.47 MIL/uL (ref 3.87–5.11)
RDW: 12.2 % (ref 11.5–15.5)
WBC: 9.2 10*3/uL (ref 4.0–10.5)
nRBC: 0 % (ref 0.0–0.2)

## 2022-06-16 LAB — URINALYSIS, ROUTINE W REFLEX MICROSCOPIC
Bacteria, UA: NONE SEEN
Bilirubin Urine: NEGATIVE
Glucose, UA: NEGATIVE mg/dL
Ketones, ur: NEGATIVE mg/dL
Leukocytes,Ua: NEGATIVE
Nitrite: POSITIVE — AB
Protein, ur: 100 mg/dL — AB
RBC / HPF: >50 RBC/hpf — ABNORMAL HIGH (ref 0–5)
Specific Gravity, Urine: 1.017 (ref 1.005–1.030)
Squamous Epithelial / HPF: NONE SEEN /HPF (ref 0–5)
WBC, UA: >50 WBC/hpf — ABNORMAL HIGH (ref 0–5)
pH: 5 (ref 5.0–8.0)

## 2022-06-16 LAB — BASIC METABOLIC PANEL
Anion gap: 9 (ref 5–15)
BUN: 12 mg/dL (ref 6–20)
CO2: 26 mmol/L (ref 22–32)
Calcium: 9 mg/dL (ref 8.9–10.3)
Chloride: 100 mmol/L (ref 98–111)
Creatinine, Ser: 0.87 mg/dL (ref 0.44–1.00)
GFR, Estimated: >60 mL/min (ref >60)
Glucose, Bld: 114 mg/dL — ABNORMAL HIGH (ref 70–99)
Potassium: 3.7 mmol/L (ref 3.5–5.1)
Sodium: 135 mmol/L (ref 135–145)

## 2022-06-16 LAB — POC URINE PREG, ED: Preg Test, Ur: NEGATIVE

## 2022-06-16 MED ORDER — SODIUM CHLORIDE 0.9 % IV BOLUS
1000.0000 mL | Freq: Once | INTRAVENOUS | Status: AC
  Administered 2022-06-16: 1000 mL via INTRAVENOUS

## 2022-06-16 MED ORDER — ONDANSETRON 4 MG PO TBDP: 4.0000 mg | ORAL_TABLET | Freq: Three times a day (TID) | ORAL | 0 refills | Status: DC | PRN

## 2022-06-16 MED ORDER — OXYCODONE-ACETAMINOPHEN 5-325 MG PO TABS: 1.0000 | ORAL_TABLET | ORAL | 0 refills | Status: AC | PRN

## 2022-06-16 MED ORDER — MORPHINE SULFATE (PF) 4 MG/ML IV SOLN
4.0000 mg | Freq: Once | INTRAVENOUS | Status: AC
  Administered 2022-06-16: 4 mg via INTRAVENOUS
  Filled 2022-06-16: qty 1

## 2022-06-16 MED ORDER — OXYCODONE-ACETAMINOPHEN 5-325 MG PO TABS
1.0000 | ORAL_TABLET | Freq: Once | ORAL | Status: AC
  Administered 2022-06-16: 1 via ORAL
  Filled 2022-06-16: qty 1

## 2022-06-16 NOTE — ED Triage Notes (Signed)
Pt presents to ER with c/o hematuria and dysuria that started yesterday.  Pt states she had ureteral stent placed 12/29, and is supposed to have it removed 1/18.  Pt states pain is only in lower abdomen and LLQ.  Pt is otherwise A&O x4 at this time in NAD.

## 2022-06-16 NOTE — ED Provider Notes (Signed)
[facility] Provider Note    Event Date/Time   First MD Initiated Contact with Patient 06/16/22 2235     (approximate)   History   Hematuria   HPI  Amber Foster is a 42 y.o. female presents to the emergency department with left-sided flank pain.  Patient had an infected kidney stone and had a stent placed in [place] on 12/30, states that since that time she has been doing well until today.  Left-sided flank pain and states that she again started having blood in her urine.  Denies any burning with urination or urinary urgency or frequency.  Denies any fever or chills.  No nausea or vomiting.  Just completed ciprofloxacin which she took for 10 days.  Plan for stent replacement or removal on 1/18     Physical Exam   Triage Vital Signs: ED Triage Vitals  Enc Vitals Group     BP 06/16/22 1905 (!) 154/98     Pulse Rate 06/16/22 1905 72     Resp 06/16/22 1905 18     Temp 06/16/22 1905 98.8 F (37.1 C)     Temp Source 06/16/22 1905 Oral     SpO2 06/16/22 1905 97 %     Weight 06/16/22 1907 153 lb (69.4 kg)     Height 06/16/22 1907 5\' 4"  (1.626 m)     Head Circumference --      Peak Flow --      Pain Score 06/16/22 1906 7     Pain Loc --      Pain Edu? --      Excl. in [place]? --     Most recent vital signs: Vitals:   06/16/22 1905 06/16/22 2233  BP: (!) 154/98 132/75  Pulse: 72 68  Resp: 18 18  Temp: 98.8 F (37.1 C) 98.7 F (37.1 C)  SpO2: 97% 98%    Physical Exam Constitutional:      Appearance: She is well-developed.  HENT:     Head: Atraumatic.  Eyes:     Conjunctiva/sclera: Conjunctivae normal.  Cardiovascular:     Rate and Rhythm: Regular rhythm.  Pulmonary:     Effort: No respiratory distress.  Abdominal:     General: There is no distension.  Musculoskeletal:        General: Normal range of motion.     Cervical back: Normal range of motion.  Skin:    General: Skin is warm.  Neurological:     Mental Status: She is alert.  Mental status is at baseline.      IMPRESSION / MDM / ASSESSMENT AND PLAN / ED COURSE  I reviewed the triage vital signs and the nursing notes.  Differential diagnosis including kidney stone, pyelonephritis, stent malfunction   RADIOLOGY I independently reviewed imaging, my interpretation of imaging: CT scan with mild hydronephrosis.  CT scan read as mild hydronephrosis, 3 mm kidney stone.  Stranding adjacent to the left kidney and stent.   Labs (all labs ordered are listed, but only abnormal results are displayed) Labs interpreted as -  No significant leukocytosis.  Urine with positive nitrite and WBC however all hide no bacteria, no leukocyte esterase and also had large RBC.  Urine culture was obtained.  Labs Reviewed  BASIC METABOLIC PANEL - Abnormal; Notable for the following components:      Result Value   Glucose, Bld 114 (*)    All other components within normal limits  URINALYSIS, ROUTINE W REFLEX MICROSCOPIC - Abnormal; Notable  for the following components:   Color, Urine AMBER (*)    APPearance HAZY (*)    Hgb urine dipstick LARGE (*)    Protein, ur 100 (*)    Nitrite POSITIVE (*)    RBC / HPF >50 (*)    WBC, UA >50 (*)    All other components within normal limits  POC URINE PREG, ED - Normal  URINE CULTURE  CBC WITH DIFFERENTIAL/PLATELET      Consulted and discussed the patient's case with urology who recommended outpatient follow-up and sending a urine culture.  Discussed calling [place] [facility] with [facility] who placed her stent in the morning.  Patient was given IV fluids and morphine for pain control.  Will discharge the patient with Percocet, discussed return precautions for any signs of an infection.   PROCEDURES:  Critical Care performed: No  Procedures  Patient's presentation is most consistent with acute presentation with potential threat to life or bodily function.   MEDICATIONS ORDERED IN ED: Medications  sodium chloride 0.9 % bolus  1,000 mL (1,000 mLs Intravenous New Bag/Given 06/16/22 2259)  morphine (PF) 4 MG/ML injection 4 mg (4 mg Intravenous Given 06/16/22 2259)  oxyCODONE-acetaminophen (PERCOCET/ROXICET) 5-325 MG per tablet 1 tablet (1 tablet Oral Given 06/16/22 2320)    FINAL CLINICAL IMPRESSION(S) / ED DIAGNOSES   Final diagnoses:  Hematuria, unspecified type  Kidney stone     Rx / DC Orders   ED Discharge Orders          Ordered    oxyCODONE-acetaminophen (PERCOCET) 5-325 MG tablet  Every 4 hours PRN        06/16/22 2318    ondansetron (ZOFRAN-ODT) 4 MG disintegrating tablet  Every 8 hours PRN        06/16/22 2318             Note:  This document was prepared using Dragon voice recognition software and may include unintentional dictation errors.   Corena Herter, MD 06/16/22 [phone]

## 2022-06-16 NOTE — ED Provider Triage Note (Signed)
Emergency Medicine Provider Triage Evaluation Note  Amber Foster , a 42 y.o. female  was evaluated in triage.  Pt complains of hematuria and dysuria starting yesterday. Known infected kidney stone and has a stent with plan for lithotripsy scheduled. Pain and hematuria acutely worsened yesterday. No fever.   Physical Exam  BP (!) 154/98   Pulse 72   Temp 98.8 F (37.1 C) (Oral)   Resp 18   Ht 5\' 4"  (1.626 m)   Wt 69.4 kg   SpO2 97%   BMI 26.26 kg/m  Gen:   Awake, no distress   Resp:  Normal effort  MSK:   Moves extremities without difficulty  Other:    Medical Decision Making  Medically screening exam initiated at 7:08 PM.  Appropriate orders placed.  Amber Foster was informed that the remainder of the evaluation will be completed by another provider, this initial triage assessment does not replace that evaluation, and the importance of remaining in the ED until their evaluation is complete.   Victorino Dike, FNP 06/16/22 1910

## 2022-06-16 NOTE — ED Notes (Signed)
E-signature pad unavailable - Pt verbalized understanding of D/C information - no additional concerns at this time.  

## 2022-06-17 DIAGNOSIS — N201 Calculus of ureter: Secondary | ICD-10-CM | POA: Diagnosis not present

## 2022-06-18 LAB — URINE CULTURE: Culture: NO GROWTH

## 2022-06-22 ENCOUNTER — Encounter (HOSPITAL_BASED_OUTPATIENT_CLINIC_OR_DEPARTMENT_OTHER): Payer: Self-pay | Admitting: Urology

## 2022-06-22 NOTE — Progress Notes (Signed)
Spoke w/ via phone for pre-op interview--- Amber Foster Lab needs dos---- UPT, and CBG.              Lab results------ COVID test -----patient states asymptomatic no test needed Arrive at -------0730 NPO after MN NO Solid Food.  Clear liquids from MN until---0630 Med rec completed Medications to take morning of surgery -----Percocet and Zofran PRN and Sprintec. Diabetic medication ----- NONE AM of surgery. Patient instructed no nail polish to be worn day of surgery Patient instructed to bring photo id and insurance card day of surgery Patient aware to have Driver (ride ) / caregiver  Mother Amber Foster  for 24 hours after surgery  Patient Special Instructions ----- Pre-Op special Istructions ----- Patient verbalized understanding of instructions that were given at this phone interview. Patient denies shortness of breath, chest pain, fever, cough at this phone interview.

## 2022-06-25 ENCOUNTER — Other Ambulatory Visit: Payer: Self-pay

## 2022-06-25 ENCOUNTER — Ambulatory Visit (HOSPITAL_BASED_OUTPATIENT_CLINIC_OR_DEPARTMENT_OTHER): Payer: BC Managed Care – PPO | Admitting: Anesthesiology

## 2022-06-25 ENCOUNTER — Encounter (HOSPITAL_BASED_OUTPATIENT_CLINIC_OR_DEPARTMENT_OTHER): Payer: Self-pay | Admitting: Urology

## 2022-06-25 ENCOUNTER — Encounter (HOSPITAL_BASED_OUTPATIENT_CLINIC_OR_DEPARTMENT_OTHER)
Admission: RE | Disposition: A | Discharge status: Home / Self Care | Payer: Self-pay | Source: Home / Self Care | Attending: Urology

## 2022-06-25 ENCOUNTER — Ambulatory Visit (HOSPITAL_COMMUNITY): Payer: BC Managed Care – PPO

## 2022-06-25 ENCOUNTER — Ambulatory Visit (HOSPITAL_BASED_OUTPATIENT_CLINIC_OR_DEPARTMENT_OTHER)
Admission: RE | Admit: 2022-06-25 | Discharge: 2022-06-25 | Disposition: A | Discharge status: Home / Self Care | Payer: BC Managed Care – PPO | Attending: Urology | Admitting: Urology

## 2022-06-25 DIAGNOSIS — Z8249 Family history of ischemic heart disease and other diseases of the circulatory system: Secondary | ICD-10-CM | POA: Insufficient documentation

## 2022-06-25 DIAGNOSIS — N200 Calculus of kidney: Secondary | ICD-10-CM

## 2022-06-25 DIAGNOSIS — I10 Essential (primary) hypertension: Secondary | ICD-10-CM | POA: Insufficient documentation

## 2022-06-25 DIAGNOSIS — Z7984 Long term (current) use of oral hypoglycemic drugs: Secondary | ICD-10-CM | POA: Insufficient documentation

## 2022-06-25 DIAGNOSIS — Z87442 Personal history of urinary calculi: Secondary | ICD-10-CM | POA: Diagnosis not present

## 2022-06-25 DIAGNOSIS — Z01818 Encounter for other preprocedural examination: Secondary | ICD-10-CM

## 2022-06-25 DIAGNOSIS — E119 Type 2 diabetes mellitus without complications: Secondary | ICD-10-CM | POA: Diagnosis not present

## 2022-06-25 DIAGNOSIS — Z87440 Personal history of urinary (tract) infections: Secondary | ICD-10-CM | POA: Insufficient documentation

## 2022-06-25 DIAGNOSIS — N201 Calculus of ureter: Secondary | ICD-10-CM | POA: Diagnosis not present

## 2022-06-25 DIAGNOSIS — E118 Type 2 diabetes mellitus with unspecified complications: Secondary | ICD-10-CM

## 2022-06-25 HISTORY — PX: CYSTOSCOPY/URETEROSCOPY/HOLMIUM LASER/STENT PLACEMENT: SHX6546

## 2022-06-25 LAB — GLUCOSE, CAPILLARY
Glucose-Capillary: 108 mg/dL — ABNORMAL HIGH (ref 70–99)
Glucose-Capillary: 141 mg/dL — ABNORMAL HIGH (ref 70–99)

## 2022-06-25 LAB — POCT PREGNANCY, URINE: Preg Test, Ur: NEGATIVE

## 2022-06-25 SURGERY — CYSTOSCOPY/URETEROSCOPY/HOLMIUM LASER/STENT PLACEMENT
Anesthesia: General | Site: Urethra | Laterality: Left

## 2022-06-25 MED ORDER — SCOPOLAMINE 1 MG/3DAYS TD PT72
1.0000 | MEDICATED_PATCH | TRANSDERMAL | Status: DC
  Administered 2022-06-25: 1.5 mg via TRANSDERMAL

## 2022-06-25 MED ORDER — ONDANSETRON HCL 4 MG/2ML IJ SOLN
INTRAMUSCULAR | Status: AC
  Filled 2022-06-25: qty 2

## 2022-06-25 MED ORDER — ACETAMINOPHEN 10 MG/ML IV SOLN: 1000.0000 mg | Freq: Once | INTRAVENOUS | Status: DC | PRN

## 2022-06-25 MED ORDER — PROMETHAZINE HCL 25 MG/ML IJ SOLN: 6.2500 mg | INTRAMUSCULAR | Status: DC | PRN

## 2022-06-25 MED ORDER — SCOPOLAMINE 1 MG/3DAYS TD PT72
MEDICATED_PATCH | TRANSDERMAL | Status: AC
  Filled 2022-06-25: qty 1

## 2022-06-25 MED ORDER — SODIUM CHLORIDE 0.9 % IR SOLN
Status: DC | PRN
  Administered 2022-06-25: 3000 mL

## 2022-06-25 MED ORDER — ONDANSETRON HCL 4 MG/2ML IJ SOLN
INTRAMUSCULAR | Status: DC | PRN
  Administered 2022-06-25: 4 mg via INTRAVENOUS

## 2022-06-25 MED ORDER — EPHEDRINE SULFATE (PRESSORS) 50 MG/ML IJ SOLN
INTRAMUSCULAR | Status: DC | PRN
  Administered 2022-06-25: 10 mg via INTRAVENOUS

## 2022-06-25 MED ORDER — LIDOCAINE HCL (CARDIAC) PF 100 MG/5ML IV SOSY
PREFILLED_SYRINGE | INTRAVENOUS | Status: DC | PRN
  Administered 2022-06-25: 60 mg via INTRAVENOUS

## 2022-06-25 MED ORDER — CIPROFLOXACIN IN D5W 400 MG/200ML IV SOLN
400.0000 mg | INTRAVENOUS | Status: AC
  Administered 2022-06-25: 400 mg via INTRAVENOUS

## 2022-06-25 MED ORDER — OXYCODONE HCL 5 MG/5ML PO SOLN: 5.0000 mg | Freq: Once | ORAL | Status: DC | PRN

## 2022-06-25 MED ORDER — PROPOFOL 10 MG/ML IV BOLUS
INTRAVENOUS | Status: DC | PRN
  Administered 2022-06-25: 170 mg via INTRAVENOUS

## 2022-06-25 MED ORDER — MIDAZOLAM HCL 2 MG/2ML IJ SOLN
INTRAMUSCULAR | Status: AC
  Filled 2022-06-25: qty 2

## 2022-06-25 MED ORDER — DEXAMETHASONE SODIUM PHOSPHATE 10 MG/ML IJ SOLN
INTRAMUSCULAR | Status: AC
  Filled 2022-06-25: qty 1

## 2022-06-25 MED ORDER — LIDOCAINE HCL (PF) 2 % IJ SOLN
INTRAMUSCULAR | Status: AC
  Filled 2022-06-25: qty 5

## 2022-06-25 MED ORDER — AMISULPRIDE (ANTIEMETIC) 5 MG/2ML IV SOLN: 10.0000 mg | Freq: Once | INTRAVENOUS | Status: DC | PRN

## 2022-06-25 MED ORDER — DEXAMETHASONE SODIUM PHOSPHATE 4 MG/ML IJ SOLN
INTRAMUSCULAR | Status: DC | PRN
  Administered 2022-06-25: 5 mg via INTRAVENOUS

## 2022-06-25 MED ORDER — PROPOFOL 10 MG/ML IV BOLUS
INTRAVENOUS | Status: AC
  Filled 2022-06-25: qty 20

## 2022-06-25 MED ORDER — FENTANYL CITRATE (PF) 100 MCG/2ML IJ SOLN: 25.0000 ug | INTRAMUSCULAR | Status: DC | PRN

## 2022-06-25 MED ORDER — ACETAMINOPHEN 325 MG PO TABS: 325.0000 mg | ORAL_TABLET | ORAL | Status: DC | PRN

## 2022-06-25 MED ORDER — CIPROFLOXACIN IN D5W 400 MG/200ML IV SOLN
INTRAVENOUS | Status: AC
  Filled 2022-06-25: qty 200

## 2022-06-25 MED ORDER — FENTANYL CITRATE (PF) 100 MCG/2ML IJ SOLN
INTRAMUSCULAR | Status: AC
  Filled 2022-06-25: qty 2

## 2022-06-25 MED ORDER — IOHEXOL 300 MG/ML  SOLN
INTRAMUSCULAR | Status: DC | PRN
  Administered 2022-06-25: 5 mL via URETHRAL

## 2022-06-25 MED ORDER — LACTATED RINGERS IV SOLN: INTRAVENOUS | Status: DC

## 2022-06-25 MED ORDER — FENTANYL CITRATE (PF) 100 MCG/2ML IJ SOLN
INTRAMUSCULAR | Status: DC | PRN
  Administered 2022-06-25: 25 ug via INTRAVENOUS
  Administered 2022-06-25: 50 ug via INTRAVENOUS
  Administered 2022-06-25: 25 ug via INTRAVENOUS

## 2022-06-25 MED ORDER — MIDAZOLAM HCL 5 MG/5ML IJ SOLN
INTRAMUSCULAR | Status: DC | PRN
  Administered 2022-06-25: 2 mg via INTRAVENOUS

## 2022-06-25 MED ORDER — KETOROLAC TROMETHAMINE 30 MG/ML IJ SOLN
INTRAMUSCULAR | Status: DC | PRN
  Administered 2022-06-25: 30 mg via INTRAVENOUS

## 2022-06-25 MED ORDER — ACETAMINOPHEN 160 MG/5ML PO SOLN: 325.0000 mg | ORAL | Status: DC | PRN

## 2022-06-25 MED ORDER — KETOROLAC TROMETHAMINE 30 MG/ML IJ SOLN
INTRAMUSCULAR | Status: AC
  Filled 2022-06-25: qty 1

## 2022-06-25 MED ORDER — CIPROFLOXACIN HCL 500 MG PO TABS: 500.0000 mg | ORAL_TABLET | Freq: Once | ORAL | 0 refills | Status: AC

## 2022-06-25 MED ORDER — OXYCODONE HCL 5 MG PO TABS: 5.0000 mg | ORAL_TABLET | Freq: Once | ORAL | Status: DC | PRN

## 2022-06-25 SURGICAL SUPPLY — 18 items
CATH URETL OPEN 5X70 (CATHETERS) ×1 IMPLANT
CLOTH BEACON ORANGE TIMEOUT ST (SAFETY) ×1 IMPLANT
EXTRACTOR STONE 1.7FRX115CM (UROLOGICAL SUPPLIES) IMPLANT
GLOVE BIO SURGEON STRL SZ7.5 (GLOVE) ×1 IMPLANT
GLOVE BIOGEL PI IND STRL 6.5 (GLOVE) IMPLANT
GOWN STRL REUS W/ TWL LRG LVL3 (GOWN DISPOSABLE) IMPLANT
GOWN STRL REUS W/TWL LRG LVL3 (GOWN DISPOSABLE) ×1
GOWN STRL REUS W/TWL XL LVL3 (GOWN DISPOSABLE) ×1 IMPLANT
GUIDEWIRE STR DUAL SENSOR (WIRE) ×1 IMPLANT
IV NS IRRIG 3000ML ARTHROMATIC (IV SOLUTION) ×2 IMPLANT
KIT TURNOVER CYSTO (KITS) ×1 IMPLANT
MANIFOLD NEPTUNE II (INSTRUMENTS) ×1 IMPLANT
PACK CYSTO (CUSTOM PROCEDURE TRAY) ×1 IMPLANT
STENT POLARIS 5FRX22 (STENTS) IMPLANT
TRACTIP FLEXIVA PULS ID 200XHI (Laser) IMPLANT
TRACTIP FLEXIVA PULSE ID 200 (Laser) ×1
TUBE CONNECTING 12X1/4 (SUCTIONS) IMPLANT
TUBING UROLOGY SET (TUBING) ×1 IMPLANT

## 2022-06-25 NOTE — Op Note (Signed)
Preoperative diagnosis: left ureteral calculus  Postoperative diagnosis: left ureteral calculus  Procedure:  Cystoscopy left ureteroscopy and stone removal Ureteroscopic laser lithotripsy left 31F x 22cm ureteral stent exchange right retrograde pyelography with interpretation  Surgeon: Ardis Hughs, MD  Anesthesia: General  Complications: None  Intraoperative findings: left retrograde pyelography demonstrated a filling defect within the left ureter consistent with the patient's known calculus without other abnormalities.  EBL: Minimal  Specimens: left ureteral calculus  Disposition of specimens: [facility] for stone analysis  Indication: Amber Foster is a 42 y.o.   patient with a history of a left proximal ureteral stone and UTI.  She underwent a ureteral stent placement 2 weeks prior.. After reviewing the management options for treatment, the patient elected to proceed with the above surgical procedure(s). We have discussed the potential benefits and risks of the procedure, side effects of the proposed treatment, the likelihood of the patient achieving the goals of the procedure, and any potential problems that might occur during the procedure or recuperation. Informed consent has been obtained.   Description of procedure:  The patient was taken to the operating room and general anesthesia was induced.  The patient was placed in the dorsal lithotomy position, prepped and draped in the usual sterile fashion, and preoperative antibiotics were administered. A preoperative time-out was performed.   Cystourethroscopy was performed.  The patient's urethra was examined and was normal. The bladder was then systematically examined in its entirety. There was no evidence for any bladder tumors, stones, or other mucosal pathology.    Attention then turned to the left ureteral orifice and the stent was grasped from the left ureteral orifice and pulled to the urethral  meatus.  Then advanced a sensor wire through the stent and into the left collecting system.  I remove the stent over the wire.  I ensured that the bladder was subsequently emptied.  I then exchanged the wire for an open-ended ureteral catheter.  Omnipaque contrast was injected through the ureteral catheter and a retrograde pyelogram was performed with findings as dictated above.  A 0.38 sensor guidewire was then advanced up the left ureter into the renal pelvis under fluoroscopic guidance. The 6 Fr semirigid ureteroscope was then advanced into the ureter next to the guidewire and the calculus was identified.   The stone was then fragmented with the 365 micron holmium laser fiber on a setting of 0.6 and frequency of 6 Hz.  The stone was lasered into 2 small pieces.  All stones were then removed from the ureter with an N-gage nitinol basket.  Reinspection of the ureter revealed no remaining visible stones or fragments.   The wire was then backloaded through the cystoscope and a ureteral stent was advance over the wire using Seldinger technique.  The stent was positioned appropriately under fluoroscopic and cystoscopic guidance.  The wire was then removed with an adequate stent curl noted in the renal pelvis as well as in the bladder.  The bladder was then emptied and the procedure ended.  The patient appeared to tolerate the procedure well and without complications.  The patient was able to be awakened and transferred to the recovery unit in satisfactory condition.   Disposition: The tether of the stent was left on and tucked inside the patient's vagina.  Instructions for removing the stent have been provided to the patient. The patient has been scheduled for followup in 6 weeks with a renal ultrasound.

## 2022-06-25 NOTE — Anesthesia Procedure Notes (Signed)
Procedure Name: LMA Insertion Date/Time: 06/25/2022 9:50 AM  Performed by: Justice Rocher, CRNAPre-anesthesia Checklist: Patient identified, Emergency Drugs available, Suction available, Patient being monitored and Timeout performed Patient Re-evaluated:Patient Re-evaluated prior to induction Oxygen Delivery Method: Circle system utilized Preoxygenation: Pre-oxygenation with 100% oxygen Induction Type: IV induction Ventilation: Mask ventilation without difficulty LMA: LMA inserted LMA Size: 4.0 Number of attempts: 1 Airway Equipment and Method: Bite block Placement Confirmation: positive ETCO2, breath sounds checked- equal and bilateral and CO2 detector Tube secured with: Tape Dental Injury: Teeth and Oropharynx as per pre-operative assessment

## 2022-06-25 NOTE — Discharge Instructions (Addendum)
DISCHARGE INSTRUCTIONS FOR KIDNEY STONE/URETERAL STENT   MEDICATIONS:  1. Resume all your other meds from home - except do not take any extra narcotic pain meds that you may have at home.  2. Pyridium is to help with the burning/stinging when you urinate. 3. Tramadol is for moderate/severe pain, otherwise taking upto 1000 mg every 6 hours of plainTylenol will help treat your pain.   4. Take Cipro one hour prior to removal of your stent.   ACTIVITY:  1. No strenuous activity x 1week  2. No driving while on narcotic pain medications  3. Drink plenty of water  4. Continue to walk at home - you can still get blood clots when you are at home, so keep active, but don't over do it.  5. May return to work/school tomorrow or when you feel ready   BATHING:  1. You can shower and we recommend daily showers  2. You have a string coming from your urethra: The stent string is attached to your ureteral stent. Do not pull on this.   SIGNS/SYMPTOMS TO CALL:  Please call us if you have a fever greater than 101.5, uncontrolled nausea/vomiting, uncontrolled pain, dizziness, unable to urinate, bloody urine, chest pain, shortness of breath, leg swelling, leg pain, redness around wound, drainage from wound, or any other concerns or questions.   You can reach [place] at [phone].   FOLLOW-UP:  1. You have an appointment in 6 weeks with a ultrasound of your kidneys prior. 2. You have a string attached to your stent, you may remove it on Monday, Jan 22nd. To do this, pull the strings until the stents are completely removed. You may feel an odd sensation in your back.      No ibuprofen, Advil, Aleve, Motrin, ketorolac, meloxicam, naproxen, or other NSAIDS until after 4:19 pm today if needed.    Post Anesthesia Home Care Instructions  Activity: Get plenty of rest for the remainder of the day. A responsible individual must stay with you for 24 hours following the procedure.  For the next 24 hours, DO  NOT: -Drive a car -Paediatric nurse -Drink alcoholic beverages -Take any medication unless instructed by your physician -Make any legal decisions or sign important papers.  Meals: Start with liquid foods such as gelatin or soup. Progress to regular foods as tolerated. Avoid greasy, spicy, heavy foods. If nausea and/or vomiting occur, drink only clear liquids until the nausea and/or vomiting subsides. Call your physician if vomiting continues.  Special Instructions/Symptoms: Your throat may feel dry or sore from the anesthesia or the breathing tube placed in your throat during surgery. If this causes discomfort, gargle with warm salt water. The discomfort should disappear within 24 hours.  If you had a scopolamine patch placed behind your ear for the management of post- operative nausea and/or vomiting:  1. The medication in the patch is effective for 72 hours, after which it should be removed.  Wrap patch in a tissue and discard in the trash. Wash hands thoroughly with soap and water. 2. You may remove the patch earlier than 72 hours if you experience unpleasant side effects which may include dry mouth, dizziness or visual disturbances. 3. Avoid touching the patch. Wash your hands with soap and water after contact with the patch.

## 2022-06-25 NOTE — Interval H&P Note (Signed)
History and Physical Interval Note:  06/25/2022 9:37 AM  Amber Foster  has presented today for surgery, with the diagnosis of LEFT URETERAL STONE.  The various methods of treatment have been discussed with the patient and family. After consideration of risks, benefits and other options for treatment, the patient has consented to  Procedure(s): LEFT URETEROSCOPY/HOLMIUM LASER/STONE EXTRACTION/LEFT URETERAL STENT EXCHANGE (Left) as a surgical intervention.  The patient's history has been reviewed, patient examined, no change in status, stable for surgery.  I have reviewed the patient's chart and labs.  Questions were answered to the patient's satisfaction.     Ardis Hughs

## 2022-06-25 NOTE — Anesthesia Preprocedure Evaluation (Addendum)
Anesthesia Evaluation  Patient identified by MRN, date of birth, ID band Patient awake    Reviewed: Allergy & Precautions, NPO status , Patient's Chart, lab work & pertinent test results  Airway Mallampati: I  TM Distance: >3 FB Neck ROM: Full    Dental  (+) Teeth Intact, Dental Advisory Given   Pulmonary neg pulmonary ROS   breath sounds clear to auscultation       Cardiovascular hypertension,  Rhythm:Regular Rate:Normal     Neuro/Psych  Headaches  negative psych ROS   GI/Hepatic negative GI ROS, Neg liver ROS,,,  Endo/Other  diabetes, Type 2, Oral Hypoglycemic Agents    Renal/GU negative Renal ROS     Musculoskeletal negative musculoskeletal ROS (+)    Abdominal   Peds  Hematology negative hematology ROS (+)   Anesthesia Other Findings - Chronic jaw click  Reproductive/Obstetrics                             Anesthesia Physical Anesthesia Plan  ASA: 2  Anesthesia Plan: General   Post-op Pain Management: Tylenol PO (pre-op)*   Induction: Intravenous  PONV Risk Score and Plan: 4 or greater and Ondansetron, Dexamethasone, Midazolam and Scopolamine patch - Pre-op  Airway Management Planned: LMA  Additional Equipment: None  Intra-op Plan:   Post-operative Plan: Extubation in OR  Informed Consent: I have reviewed the patients History and Physical, chart, labs and discussed the procedure including the risks, benefits and alternatives for the proposed anesthesia with the patient or authorized representative who has indicated his/her understanding and acceptance.     Dental advisory given  Plan Discussed with: CRNA  Anesthesia Plan Comments:        Anesthesia Quick Evaluation

## 2022-06-25 NOTE — Anesthesia Postprocedure Evaluation (Signed)
Anesthesia Post Note  Patient: Amber Foster  Procedure(s) Performed: LEFT URETEROSCOPY/HOLMIUM LASER/STONE EXTRACTION/LEFT URETERAL STENT EXCHANGE/ RETROGRADE PYELOGRAM (Left: Urethra)     Patient location during evaluation: PACU Anesthesia Type: General Level of consciousness: awake and alert Pain management: pain level controlled Vital Signs Assessment: post-procedure vital signs reviewed and stable Respiratory status: spontaneous breathing, nonlabored ventilation, respiratory function stable and patient connected to nasal cannula oxygen Cardiovascular status: blood pressure returned to baseline and stable Postop Assessment: no apparent nausea or vomiting Anesthetic complications: no  No notable events documented.  Last Vitals:  Vitals:   06/25/22 1045 06/25/22 1100  BP: 127/69 107/67  Pulse: 71 (!) 59  Resp: 12 17  Temp:    SpO2: 97% 96%    Last Pain:  Vitals:   06/25/22 0810  TempSrc: Oral  PainSc: 2                  Effie Berkshire

## 2022-06-25 NOTE — Transfer of Care (Signed)
Immediate Anesthesia Transfer of Care Note  Patient: Amber Foster  Procedure(s) Performed: Procedure(s) (LRB): LEFT URETEROSCOPY/HOLMIUM LASER/STONE EXTRACTION/LEFT URETERAL STENT EXCHANGE/ RETROGRADE PYELOGRAM (Left)  Patient Location: PACU  Anesthesia Type: General  Level of Consciousness: awake, sedated, patient cooperative and responds to stimulation  Airway & Oxygen Therapy: Patient Spontanous Breathing and Patient connected to  oxygen  Post-op Assessment: Report given to PACU RN, Post -op Vital signs reviewed and stable and Patient moving all extremities  Post vital signs: Reviewed and stable  Complications: No apparent anesthesia complications

## 2022-06-26 ENCOUNTER — Encounter (HOSPITAL_BASED_OUTPATIENT_CLINIC_OR_DEPARTMENT_OTHER): Payer: Self-pay | Admitting: Urology

## 2022-07-02 LAB — CALCULI, WITH PHOTOGRAPH (CLINICAL LAB)
Calcium Oxalate Dihydrate: 20 %
Calcium Oxalate Monohydrate: 70 %
Hydroxyapatite: 10 %
Weight Calculi: 51 mg

## 2022-07-02 NOTE — Addendum Note (Signed)
Encounter addended by: Nathaniel Man, MD on: 07/02/2022 7:49 PM  Actions taken: Letter saved

## 2022-07-06 ENCOUNTER — Encounter (HOSPITAL_BASED_OUTPATIENT_CLINIC_OR_DEPARTMENT_OTHER): Payer: Self-pay | Admitting: Urology

## 2022-07-06 NOTE — Addendum Note (Signed)
Addendum  created 07/06/22 1918 by Nuri Branca D, MD   Intraprocedure Event edited, Intraprocedure Staff edited    

## 2022-07-20 ENCOUNTER — Telehealth: Payer: Self-pay

## 2022-07-20 NOTE — Telephone Encounter (Signed)
Received refill request from Pharmacy. Patient had TOC on 05/27/22. That was canceled and not rescheduled. Please call patient to set up visit and let me know when made so that we can send request for review.

## 2022-07-20 NOTE — Telephone Encounter (Signed)
LVM for patient to callback and schedule.  

## 2022-09-02 NOTE — Telephone Encounter (Signed)
Lvmtcb, sent mychart message  

## 2022-09-02 NOTE — Telephone Encounter (Signed)
Please call patient to see if she wants TOC or has she started with new PCP?

## 2022-11-07 DIAGNOSIS — L600 Ingrowing nail: Secondary | ICD-10-CM | POA: Diagnosis not present

## 2022-11-07 DIAGNOSIS — L03032 Cellulitis of left toe: Secondary | ICD-10-CM | POA: Diagnosis not present

## 2023-02-22 ENCOUNTER — Encounter: Payer: Self-pay | Admitting: Family Medicine

## 2023-03-12 ENCOUNTER — Encounter: Payer: BC Managed Care – PPO | Admitting: Family Medicine

## 2023-04-21 ENCOUNTER — Encounter: Payer: Self-pay | Admitting: Family Medicine

## 2023-04-21 ENCOUNTER — Ambulatory Visit (INDEPENDENT_AMBULATORY_CARE_PROVIDER_SITE_OTHER): Payer: BC Managed Care – PPO | Admitting: Family Medicine

## 2023-04-21 VITALS — BP 157/89 | HR 78 | Temp 98.2°F | Resp 16 | Ht 64.0 in | Wt 175.0 lb

## 2023-04-21 DIAGNOSIS — N926 Irregular menstruation, unspecified: Secondary | ICD-10-CM

## 2023-04-21 DIAGNOSIS — E1169 Type 2 diabetes mellitus with other specified complication: Secondary | ICD-10-CM | POA: Diagnosis not present

## 2023-04-21 DIAGNOSIS — E538 Deficiency of other specified B group vitamins: Secondary | ICD-10-CM

## 2023-04-21 DIAGNOSIS — E559 Vitamin D deficiency, unspecified: Secondary | ICD-10-CM | POA: Diagnosis not present

## 2023-04-21 DIAGNOSIS — Z1159 Encounter for screening for other viral diseases: Secondary | ICD-10-CM | POA: Diagnosis not present

## 2023-04-21 DIAGNOSIS — E282 Polycystic ovarian syndrome: Secondary | ICD-10-CM

## 2023-04-21 DIAGNOSIS — E785 Hyperlipidemia, unspecified: Secondary | ICD-10-CM | POA: Diagnosis not present

## 2023-04-21 DIAGNOSIS — E119 Type 2 diabetes mellitus without complications: Secondary | ICD-10-CM

## 2023-04-21 DIAGNOSIS — E1159 Type 2 diabetes mellitus with other circulatory complications: Secondary | ICD-10-CM

## 2023-04-21 DIAGNOSIS — E118 Type 2 diabetes mellitus with unspecified complications: Secondary | ICD-10-CM | POA: Diagnosis not present

## 2023-04-21 DIAGNOSIS — I152 Hypertension secondary to endocrine disorders: Secondary | ICD-10-CM | POA: Diagnosis not present

## 2023-04-21 DIAGNOSIS — I499 Cardiac arrhythmia, unspecified: Secondary | ICD-10-CM

## 2023-04-21 DIAGNOSIS — Z3009 Encounter for other general counseling and advice on contraception: Secondary | ICD-10-CM

## 2023-04-21 DIAGNOSIS — D509 Iron deficiency anemia, unspecified: Secondary | ICD-10-CM | POA: Diagnosis not present

## 2023-04-21 DIAGNOSIS — Z1231 Encounter for screening mammogram for malignant neoplasm of breast: Secondary | ICD-10-CM

## 2023-04-21 LAB — POCT URINE PREGNANCY: Preg Test, Ur: NEGATIVE

## 2023-04-21 NOTE — Patient Instructions (Addendum)
It was a pleasure meeting you today. Thank you for allowing me to take part in your health care.  Our goals for today as we discussed include:  Schedule appointment for fasting blood work.  Fast for 10 hours  PAP is due.  Please schedule appointment at earliest convenience  Referral sent to [facility] eye clinic.  Please call to schedule appointment   Referral sent for Mammogram. Please call to schedule appointment. [facility] [address] [place], [place] [address] [phone]      This is a list of the screening recommended for you and due dates:  Health Maintenance  Topic Date Due   Complete foot exam   Never done   Eye exam for diabetics  Never done   Yearly kidney health urinalysis for diabetes  Never done   Hepatitis C Screening  Never done   Pap with HPV screening  10/24/2019   Hemoglobin A1C  08/26/2022   Yearly kidney function blood test for diabetes  06/17/2023   DTaP/Tdap/Td vaccine (2 - Td or Tdap) 12/01/2028   HIV Screening  Completed   HPV Vaccine  Aged Out   Flu Shot  Discontinued   COVID-19 Vaccine  Discontinued      If you have any questions or concerns, please do not hesitate to call the office at [phone].  I look forward to our next visit and until then take care and stay safe.  Regards,   Dana Allan, MD   [facility]

## 2023-04-21 NOTE — Progress Notes (Signed)
SUBJECTIVE:   Chief Complaint  Patient presents with   Establish Care    Transfer of Care From Dr. Selena Batten     HPI Presents to clinic to establish care  Discussed the use of AI scribe software for clinical note transcription with the patient, who gave verbal consent to proceed.  History of Present Illness The patient, a 42 year old with a history of diabetes, hypertension, and polycystic ovary syndrome (PCOS), presents for a transfer of care. The patient reports a history of gestational diabetes with three pregnancies, with the last pregnancy four years ago leading to a diagnosis of type 2 diabetes. The patient was previously on metformin, but has been off the medication for a few months. Home glucose monitoring reveals occasional highs and lows, with the highest reading around 150.  The patient also reports a history of high blood pressure, which was previously managed with losartan. The patient has not been on this medication recently. The patient describes an episode of severe heart palpitations and shortness of breath that led to the initial diagnosis of hypertension.  The patient has a history of kidney stones, including an infected kidney stone that required stent placement. The patient also reports a history of urinary tract infections and kidney infections.  The patient underwent gallbladder removal in 2013 during pregnancy and had a hernia repair with mesh placement in 2021. The patient also reports a history of diastasis recti following the last delivery.  The patient has a history of PCOS, which has led to several uterine and ovarian biopsies. The patient was previously on Sprintec for PCOS management but has not been on this medication recently.  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 The patient was told that back surgery would not be considered until the patient was unable to walk.  The patient has a history of moderate fatty liver  disease and has been advised to start a cholesterol medication. The patient also reports occasional shortness of breath.  The patient has a social history of occasional smoking and drinking, but has not used either in the past ten years. The patient denies any current use of illicit substances.  The patient's family history is significant for hypertension and diabetes in the mother. The patient does not have any information about the father's medical history.  The patient's preventive care is not up to date, with the last mammogram and Pap smear not done recently. The patient has not had a colonoscopy. The patient reports a negative reaction to the flu vaccine in the past and has not had a recent tetanus vaccine.  The patient's main concern is managing diabetes and hypertension. The patient is open to starting medications again, including metformin or a different medication, depending on lab results. The patient is also open to injectable medications if necessary. The patient's goal is to manage these conditions effectively to be able to care for her children and husband.    PERTINENT PMH / PSH: As above  OBJECTIVE:  BP (!) 157/89   Pulse 78   Temp 98.2 F (36.8 C)   Resp 16   Ht 5\' 4"  (1.626 m)   Wt 175 lb (79.4 kg)   LMP 02/07/2023   SpO2 98%   BMI 30.04 kg/m    Physical Exam Vitals reviewed.  Constitutional:      General: She is not in acute distress.    Appearance: She is not ill-appearing.  HENT:     Head: Normocephalic.  Right Ear: Tympanic membrane, ear canal and external ear normal.     Left Ear: Tympanic membrane, ear canal and external ear normal.     Nose: Nose normal.     Mouth/Throat:     Mouth: Mucous membranes are moist.  Eyes:     Extraocular Movements: Extraocular movements intact.     Conjunctiva/sclera: Conjunctivae normal.     Pupils: Pupils are equal, round, and reactive to light.  Neck:     Vascular: No carotid bruit.  Cardiovascular:     Rate  and Rhythm: Normal rate. Rhythm regularly irregular.     Pulses: Normal pulses.     Heart sounds: Normal heart sounds.  Pulmonary:     Effort: Pulmonary effort is normal.     Breath sounds: Normal breath sounds.  Abdominal:     General: Bowel sounds are normal. There is no distension.     Palpations: Abdomen is soft.     Tenderness: There is no abdominal tenderness. There is no right CVA tenderness, left CVA tenderness, guarding or rebound.  Musculoskeletal:        General: Normal range of motion.     Cervical back: Normal range of motion.     Right lower leg: No edema.     Left lower leg: No edema.  Lymphadenopathy:     Cervical: No cervical adenopathy.  Skin:    Capillary Refill: Capillary refill takes less than 2 seconds.  Neurological:     General: No focal deficit present.     Mental Status: She is alert and oriented to person, place, and time. Mental status is at baseline.     Motor: No weakness.  Psychiatric:        Mood and Affect: Mood normal.        Behavior: Behavior normal.        Thought Content: Thought content normal.        Judgment: Judgment normal.        04/26/2023    4:02 PM 04/21/2023    2:07 PM 11/25/2021   11:54 AM 03/06/2020    9:57 AM 11/01/2018    3:11 PM  Depression screen PHQ 2/9  Decreased Interest 0 0 0 0 0  Down, Depressed, Hopeless 0 0 0 0 0  PHQ - 2 Score 0 0 0 0 0  Altered sleeping 1 2     Tired, decreased energy 1 3     Change in appetite 1 0     Feeling bad or failure about yourself  0 0     Trouble concentrating 1 0     Moving slowly or fidgety/restless 0 0     Suicidal thoughts 0 0     PHQ-9 Score 4 5     Difficult doing work/chores Not difficult at all Somewhat difficult         04/26/2023    4:03 PM 04/21/2023    2:07 PM  GAD 7 : Generalized Anxiety Score  Nervous, Anxious, on Edge 2 1  Control/stop worrying 1 1  Worry too much - different things 0 0  Trouble relaxing 0 1  Restless 0 2  Easily annoyed or irritable 1 2   Afraid - awful might happen 0 0  Total GAD 7 Score 4 7  Anxiety Difficulty Not difficult at all Somewhat difficult    ASSESSMENT/PLAN:  Type 2 diabetes mellitus with complications [facility]) Assessment & Plan: History of gestational diabetes, converted to DM T2 shortly thereafter last pregnancy,  currently off Metformin with home glucose readings occasionally reaching 150-200. Last A1c was 6.0, but previously as high as 6.8. Patient reports previous intolerance to Metformin. -Order point of care A1c today. -Order fasting labs including kidney function. -Consider starting GLP-1 agonist for blood glucose control and weight loss, pending lab results.  Orders: -     Hemoglobin A1c -     Microalbumin / creatinine urine ratio  Diabetic eye exam (HCC) -     Ambulatory referral to Ophthalmology  Hypertension associated with diabetes (HCC) Assessment & Plan: Blood pressure reading of 170/96 today, history of high readings at home. -Order kidney function tests. -Consider starting Losartan or similar medication, pending lab results.  Orders: -     Comprehensive metabolic panel  Hyperlipidemia associated with type 2 diabetes mellitus (HCC) Assessment & Plan: History of moderate fatty liver disease, no recent lipid profile. -Order fasting lipid profile. -Consider starting statin therapy, pending lab results.  Orders: -     Lipid panel  Need for hepatitis C screening test -     Hepatitis C antibody  Vitamin B 12 deficiency -     Vitamin B12  Vitamin D deficiency -     VITAMIN D 25 Hydroxy (Vit-D Deficiency, Fractures)  PCOS (polycystic ovarian syndrome) Assessment & Plan: Irregular menses Check UPT Consider OCP or other forms of birth control in near future   Birth control counseling Assessment & Plan: Recommend discontinuing Sprintec given increase in BP Consider Micronor if UPT negative   Controlled type 2 diabetes mellitus with complication, without long-term current use  of insulin (HCC) Assessment & Plan: History of gestational diabetes, converted to DM T2 shortly thereafter last pregnancy, currently off Metformin with home glucose readings occasionally reaching 150-200. Last A1c was 6.0, but previously as high as 6.8. Patient reports previous intolerance to Metformin. -Order point of care A1c today. -Order fasting labs including kidney function. -Consider starting GLP-1 agonist for blood glucose control and weight loss, pending lab results.   Missed period Assessment & Plan: History of PCOS LMP 02/07/23 Currently sexually active Has been off current OCP  Orders: -     POCT urine pregnancy  Irregular heart rhythm Assessment & Plan: Possible heart murmur noted on exam, history of palpitations. -Order EKG today. EKG: normal EKG, normal sinus rhythm.   Orders: -     EKG 12-Lead -     TSH  Breast cancer screening by mammogram -     3D Screening Mammogram, Left and Right; Future  Iron deficiency anemia, unspecified iron deficiency anemia type -     IBC + Ferritin -     CBC with Differential/Platelet   General Health Maintenance -Schedule Pap smear for 2 weeks after next menstrual period. -Consider tetanus vaccine update.    PDMP reviewed  Return if symptoms worsen or fail to improve, for PCP.  Dana Allan, MD

## 2023-04-22 LAB — CBC WITH DIFFERENTIAL/PLATELET
Basophils Absolute: 0.1 10*3/uL (ref 0.0–0.1)
Basophils Relative: 1 % (ref 0.0–3.0)
Eosinophils Absolute: 0.2 10*3/uL (ref 0.0–0.7)
Eosinophils Relative: 3.1 % (ref 0.0–5.0)
HCT: 41.7 % (ref 36.0–46.0)
Hemoglobin: 13.7 g/dL (ref 12.0–15.0)
Lymphocytes Relative: 26.3 % (ref 12.0–46.0)
Lymphs Abs: 2 10*3/uL (ref 0.7–4.0)
MCHC: 32.9 g/dL (ref 30.0–36.0)
MCV: 91.5 fL (ref 78.0–100.0)
Monocytes Absolute: 0.4 10*3/uL (ref 0.1–1.0)
Monocytes Relative: 5.1 % (ref 3.0–12.0)
Neutro Abs: 4.9 10*3/uL (ref 1.4–7.7)
Neutrophils Relative %: 64.5 % (ref 43.0–77.0)
Platelets: 243 10*3/uL (ref 150.0–400.0)
RBC: 4.56 Mil/uL (ref 3.87–5.11)
RDW: 13.5 % (ref 11.5–15.5)
WBC: 7.6 10*3/uL (ref 4.0–10.5)

## 2023-04-22 LAB — LIPID PANEL
Cholesterol: 185 mg/dL (ref 0–200)
HDL: 34.3 mg/dL — ABNORMAL LOW (ref >39.00)
LDL Cholesterol: 95 mg/dL (ref 0–99)
NonHDL: 150.81
Total CHOL/HDL Ratio: 5
Triglycerides: 278 mg/dL — ABNORMAL HIGH (ref 0.0–149.0)
VLDL: 55.6 mg/dL — ABNORMAL HIGH (ref 0.0–40.0)

## 2023-04-22 LAB — TSH: TSH: 2.02 u[IU]/mL (ref 0.35–5.50)

## 2023-04-22 LAB — COMPREHENSIVE METABOLIC PANEL
ALT: 34 U/L (ref 0–35)
AST: 31 U/L (ref 0–37)
Albumin: 4.3 g/dL (ref 3.5–5.2)
Alkaline Phosphatase: 87 U/L (ref 39–117)
BUN: 9 mg/dL (ref 6–23)
CO2: 27 meq/L (ref 19–32)
Calcium: 9.5 mg/dL (ref 8.4–10.5)
Chloride: 101 meq/L (ref 96–112)
Creatinine, Ser: 0.57 mg/dL (ref 0.40–1.20)
GFR: 112.81 mL/min (ref >60.00)
Glucose, Bld: 129 mg/dL — ABNORMAL HIGH (ref 70–99)
Potassium: 3.9 meq/L (ref 3.5–5.1)
Sodium: 139 meq/L (ref 135–145)
Total Bilirubin: 0.5 mg/dL (ref 0.2–1.2)
Total Protein: 7.2 g/dL (ref 6.0–8.3)

## 2023-04-22 LAB — HEMOGLOBIN A1C: Hgb A1c MFr Bld: 7.3 % — ABNORMAL HIGH (ref 4.6–6.5)

## 2023-04-22 LAB — VITAMIN B12: Vitamin B-12: 439 pg/mL (ref 211–911)

## 2023-04-22 LAB — HEPATITIS C ANTIBODY: Hepatitis C Ab: NONREACTIVE

## 2023-04-22 LAB — IBC + FERRITIN
Ferritin: 21.9 ng/mL (ref 10.0–291.0)
Iron: 79 ug/dL (ref 42–145)
Saturation Ratios: 18.1 % — ABNORMAL LOW (ref 20.0–50.0)
TIBC: 435.4 ug/dL (ref 250.0–450.0)
Transferrin: 311 mg/dL (ref 212.0–360.0)

## 2023-04-22 LAB — VITAMIN D 25 HYDROXY (VIT D DEFICIENCY, FRACTURES): VITD: 13.36 ng/mL — ABNORMAL LOW (ref 30.00–100.00)

## 2023-04-22 LAB — MICROALBUMIN / CREATININE URINE RATIO
Creatinine,U: 63.5 mg/dL
Microalb Creat Ratio: 12 mg/g (ref 0.0–30.0)
Microalb, Ur: 7.6 mg/dL — ABNORMAL HIGH (ref 0.0–1.9)

## 2023-04-22 NOTE — Telephone Encounter (Signed)
Patient states she is returning a call from [employer], CMA.  I read Kelly's note to patient.  Patient states Dr. Dana Allan told her that she was going to go ahead and start her on medication as soon as her test results came back.  Patient states we usually just give her lab results to her over the phone.  Patient states she is wondering if they are really bad because we are asking her to schedule an appointment.  I spoke with Vertis Kelch, CMA, and relayed patient's message.  Tresa Endo states Dr. Clent Ridges would like to meet with her in person so she will be able to answer all of her questions for her during the visit instead of sending questions back and forth.  Patient states she would like to talk with Dr. Clent Ridges sooner if possible.  Patient states her husband has cancer and they have four children and she is really concerned about the possibility of having a health issue herself.  I checked Dr. Claris Che schedule and there are no openings between now and her scheduled appointment.  I did add patient to the wait list in case someone cancels.

## 2023-04-26 ENCOUNTER — Encounter: Payer: Self-pay | Admitting: Family Medicine

## 2023-04-26 ENCOUNTER — Telehealth: Payer: Self-pay

## 2023-04-26 ENCOUNTER — Ambulatory Visit: Payer: BC Managed Care – PPO | Admitting: Family Medicine

## 2023-04-26 VITALS — BP 158/100 | HR 75 | Temp 99.1°F | Resp 18 | Ht 64.0 in | Wt 172.5 lb

## 2023-04-26 DIAGNOSIS — Z7985 Long-term (current) use of injectable non-insulin antidiabetic drugs: Secondary | ICD-10-CM

## 2023-04-26 DIAGNOSIS — K529 Noninfective gastroenteritis and colitis, unspecified: Secondary | ICD-10-CM

## 2023-04-26 DIAGNOSIS — E1159 Type 2 diabetes mellitus with other circulatory complications: Secondary | ICD-10-CM

## 2023-04-26 DIAGNOSIS — L600 Ingrowing nail: Secondary | ICD-10-CM

## 2023-04-26 DIAGNOSIS — E559 Vitamin D deficiency, unspecified: Secondary | ICD-10-CM

## 2023-04-26 DIAGNOSIS — E118 Type 2 diabetes mellitus with unspecified complications: Secondary | ICD-10-CM

## 2023-04-26 DIAGNOSIS — E785 Hyperlipidemia, unspecified: Secondary | ICD-10-CM

## 2023-04-26 DIAGNOSIS — I152 Hypertension secondary to endocrine disorders: Secondary | ICD-10-CM

## 2023-04-26 DIAGNOSIS — E1169 Type 2 diabetes mellitus with other specified complication: Secondary | ICD-10-CM

## 2023-04-26 DIAGNOSIS — Z30011 Encounter for initial prescription of contraceptive pills: Secondary | ICD-10-CM | POA: Diagnosis not present

## 2023-04-26 DIAGNOSIS — D509 Iron deficiency anemia, unspecified: Secondary | ICD-10-CM

## 2023-04-26 MED ORDER — SEMAGLUTIDE(0.25 OR 0.5MG/DOS) 2 MG/3ML ~~LOC~~ SOPN: 0.2500 mg | PEN_INJECTOR | SUBCUTANEOUS | 0 refills | Status: DC

## 2023-04-26 MED ORDER — LOSARTAN POTASSIUM 25 MG PO TABS: 25.0000 mg | ORAL_TABLET | Freq: Every day | ORAL | 0 refills | Status: DC

## 2023-04-26 MED ORDER — ROSUVASTATIN CALCIUM 10 MG PO TABS: 10.0000 mg | ORAL_TABLET | Freq: Every evening | ORAL | 3 refills | Status: DC

## 2023-04-26 MED ORDER — NORETHINDRONE 0.35 MG PO TABS: 1.0000 | ORAL_TABLET | Freq: Every day | ORAL | 11 refills | Status: DC

## 2023-04-26 MED ORDER — ROSUVASTATIN CALCIUM 10 MG PO TABS: 10.0000 mg | ORAL_TABLET | Freq: Every day | ORAL | 3 refills | Status: DC

## 2023-04-26 MED ORDER — LOSARTAN POTASSIUM 25 MG PO TABS: 25.0000 mg | ORAL_TABLET | Freq: Every evening | ORAL | 0 refills | Status: DC

## 2023-04-26 MED ORDER — OZEMPIC (0.25 OR 0.5 MG/DOSE) 2 MG/1.5ML ~~LOC~~ SOPN: 0.2500 mg | PEN_INJECTOR | SUBCUTANEOUS | Status: DC

## 2023-04-26 MED ORDER — VITAMIN D (ERGOCALCIFEROL) 1.25 MG (50000 UNIT) PO CAPS: 50000.0000 [IU] | ORAL_CAPSULE | ORAL | 1 refills | Status: DC

## 2023-04-26 NOTE — Telephone Encounter (Signed)
Medication Samples have been provided to the patient.  Drug name: Ozempic        Strength: 0.25 mg        Qty: 1 box  LOT: [id number]  Exp.Date: 06-08-23  Dosing instructions: inject 0.25 mg into the skin weekly   The patient has been instructed regarding the correct time, dose, and frequency of taking this medication, including desired effects and most common side effects.   Donavan Foil 4:47 PM 04/26/2023

## 2023-04-26 NOTE — Progress Notes (Signed)
SUBJECTIVE:   Chief Complaint  Patient presents with   lab results   Medication Refill    And discuss recent lab results   HPI Presents to clinic to discuss recent blood work  Discussed the use of AI scribe software for clinical note transcription with the patient, who gave verbal consent to proceed.  History of Present Illness   The patient, with a history of diabetes, hypertension, and PCOS, presents for a follow-up consultation regarding recent blood work. She reports a three-week history of nausea, vomiting, and diarrhea, which she describes as sometimes being liquid in consistency. The frequency of these episodes is daily to every other day. She also reports low abdominal cramping, which she finds difficult to distinguish from her PCOS symptoms or impending diarrhea. She has been feeling extremely tired, to the point of exhaustion.  The patient has a history of gallbladder removal approximately 12-13 years ago and has noted that others with a similar history have reported similar gastrointestinal symptoms. She also has a mesh from a previous hernia repair and diastasis recti correction, which she believes may be contributing to her abdominal discomfort.  In terms of her diabetes management, she reports previous intolerance to metformin due to sickness. She has been on losartan in the past for her hypertension. She also has a history of moderate fatty liver disease, diagnosed approximately ten years ago.  The patient has not been sexually active since her last consultation and has a history of using Sprintec for birth control. She expresses a strong desire to avoid pregnancy due to concerns about her abdominal mesh. She has not been on any birth control recently due to high blood pressure.  The patient's blood pressure readings at home are usually in the range of 140-150/80s, but at this consultation, it was noted to be 160/100. She also has protein in her urine. Despite these issues,  her kidney function is reported to be good.  The patient's recent blood work showed an elevated A1C, high cholesterol, and low vitamin D. Her liver function, however, was reported to be good. She has been advised to increase her iron intake through diet as her ferritin stores were slightly low, but she is not anemic.    PERTINENT PMH / PSH: As above  OBJECTIVE:  BP (!) 158/100   Pulse 75   Temp 99.1 F (37.3 C) (Oral)   Resp 18   Ht 5\' 4"  (1.626 m)   Wt 172 lb 8 oz (78.2 kg)   LMP 02/07/2023   SpO2 97%   BMI 29.61 kg/m    Physical Exam Vitals reviewed.  Constitutional:      General: She is not in acute distress.    Appearance: Normal appearance. She is normal weight. She is not ill-appearing, toxic-appearing or diaphoretic.  Eyes:     General:        Right eye: No discharge.        Left eye: No discharge.     Conjunctiva/sclera: Conjunctivae normal.  Cardiovascular:     Rate and Rhythm: Normal rate and regular rhythm.     Heart sounds: Normal heart sounds.  Pulmonary:     Effort: Pulmonary effort is normal.     Breath sounds: Normal breath sounds.  Musculoskeletal:        General: Normal range of motion.  Skin:    General: Skin is warm and dry.  Neurological:     General: No focal deficit present.     Mental Status: She  is alert and oriented to person, place, and time. Mental status is at baseline.  Psychiatric:        Mood and Affect: Mood normal.        Behavior: Behavior normal.        Thought Content: Thought content normal.        Judgment: Judgment normal.        04/26/2023    4:02 PM 04/21/2023    2:07 PM 11/25/2021   11:54 AM 03/06/2020    9:57 AM 11/01/2018    3:11 PM  Depression screen PHQ 2/9  Decreased Interest 0 0 0 0 0  Down, Depressed, Hopeless 0 0 0 0 0  PHQ - 2 Score 0 0 0 0 0  Altered sleeping 1 2     Tired, decreased energy 1 3     Change in appetite 1 0     Feeling bad or failure about yourself  0 0     Trouble concentrating 1 0      Moving slowly or fidgety/restless 0 0     Suicidal thoughts 0 0     PHQ-9 Score 4 5     Difficult doing work/chores Not difficult at all Somewhat difficult         04/26/2023    4:03 PM 04/21/2023    2:07 PM  GAD 7 : Generalized Anxiety Score  Nervous, Anxious, on Edge 2 1  Control/stop worrying 1 1  Worry too much - different things 0 0  Trouble relaxing 0 1  Restless 0 2  Easily annoyed or irritable 1 2  Afraid - awful might happen 0 0  Total GAD 7 Score 4 7  Anxiety Difficulty Not difficult at all Somewhat difficult    ASSESSMENT/PLAN:  Type 2 diabetes mellitus with complications (HCC) Assessment & Plan: Elevated A1c. Previous intolerance to Metformin. Discussed options of Ozempic or Rybelsus. -Start Ozempic, dose based on A1c, not weight. -Follow up in 4 weeks to assess response. -Start ARB/ Statin therapy -Foot exam due, referral sent to podiatry -Recommend annual eye exam  Orders: -     Ozempic (0.25 or 0.5 MG/DOSE); Inject 0.25 mg into the skin once a week. -     Semaglutide(0.25 or 0.5MG /DOS); Inject 0.25 mg into the skin once a week.  Dispense: 3 mL; Refill: 0  BCP (birth control pills) initiation Assessment & Plan: Discussed need for contraception if starting Losartan. Recent UPT negative.  Has not had sexual relations since last visit. -Start Micronor (progesterone-only pill) to prevent pregnancy.  -Discussed with patient teratogenic effects of ARB if possible pregnancy and offered Amlodipine.  Patient not wanting pregnancy and indicates compliancy with OCP.  Orders: -     Norethindrone; Take 1 tablet (0.35 mg total) by mouth daily.  Dispense: 28 tablet; Refill: 11  Hypertension associated with diabetes (HCC) Assessment & Plan: Blood pressure consistently above target range. Discussed starting Losartan, but potential pregnancy is a concern. -Start Losartan 25mg  at night. -Check blood work 1 week after starting Losartan.  Orders: -     Losartan  Potassium; Take 1 tablet (25 mg total) by mouth at bedtime.  Dispense: 90 tablet; Refill: 0 -     Basic metabolic panel; Future  Vitamin D deficiency Assessment & Plan: Low vitamin D levels. -Start Vitamin D 1.25mg  weekly for 6 months, then switch to daily Vitamin D. -Recheck Vitamin D levels in 8-9 months.  Orders: -     Vitamin D (Ergocalciferol); Take 1  capsule (50,000 Units total) by mouth every 7 (seven) days.  Dispense: 12 capsule; Refill: 1  Hyperlipidemia associated with type 2 diabetes mellitus (HCC) Assessment & Plan: Elevated triglycerides and low HDL. Discussed the benefits of Crestor in reducing triglycerides and cholesterol. -Start Crestor 10mg  to lower cholesterol and triglycerides.  Orders: -     Rosuvastatin Calcium; Take 1 tablet (10 mg total) by mouth every evening.  Dispense: 90 tablet; Refill: 3  Ingrown nail of great toe of left foot -     Ambulatory referral to Podiatry  Iron deficiency anemia, unspecified iron deficiency anemia type Assessment & Plan: Low ferritin stores, but normal hemoglobin and iron levels. -Increase dietary iron intake.   Chronic diarrhea Assessment & Plan: Daily to every other day diarrhea for the past 3 months. Nonbloody, no mucus and no recent travel. -Recommend change in dietary habits -Collect stool samples for further evaluation if no improvement at next visit.  Recent labs reassuring for no electrolyte imbalance or infection -If symptoms persist, consider referral to GI.     PDMP reviewed  Return in about 4 weeks (around 05/24/2023) for PCP, diabetic management.  Dana Allan, MD

## 2023-04-26 NOTE — Patient Instructions (Addendum)
It was a pleasure meeting you today. Thank you for allowing me to take part in your health care.  Our goals for today as we discussed include:  Start Ozempic 0.25 mg weekly.  Sample provided  Start Crestor 10 mg daily  Start Losartan 25 mg at night Schedule lab appointment in 1 week  Vitamin D low.  Start Vitamin D 1.25 mg weekly for 6 months then switch to over the counter Vitamin D 800iu daily.   Referral sent to podiatry for foot evaluation  Please have eye exam forwarded to clinic  Schedule PAP at your earliest convenience  This is a list of the screening recommended for you and due dates:  Health Maintenance  Topic Date Due   Complete foot exam   Never done   Eye exam for diabetics  Never done   Pap with HPV screening  10/24/2019   Flu Shot  09/06/2023*   Hemoglobin A1C  10/19/2023   Yearly kidney function blood test for diabetes  04/20/2024   Yearly kidney health urinalysis for diabetes  04/20/2024   DTaP/Tdap/Td vaccine (2 - Td or Tdap) 12/01/2028   Hepatitis C Screening  Completed   HIV Screening  Completed   HPV Vaccine  Aged Out   COVID-19 Vaccine  Discontinued  *Topic was postponed. The date shown is not the original due date.     Follow up in 4 weeks   If you have any questions or concerns, please do not hesitate to call the office at [phone].  I look forward to our next visit and until then take care and stay safe.  Regards,   Dana Allan, MD   [facility]

## 2023-04-28 ENCOUNTER — Other Ambulatory Visit: Payer: BC Managed Care – PPO

## 2023-04-30 ENCOUNTER — Encounter: Payer: Self-pay | Admitting: Family Medicine

## 2023-04-30 DIAGNOSIS — D509 Iron deficiency anemia, unspecified: Secondary | ICD-10-CM | POA: Insufficient documentation

## 2023-04-30 DIAGNOSIS — E559 Vitamin D deficiency, unspecified: Secondary | ICD-10-CM | POA: Insufficient documentation

## 2023-04-30 DIAGNOSIS — E119 Type 2 diabetes mellitus without complications: Secondary | ICD-10-CM | POA: Insufficient documentation

## 2023-04-30 DIAGNOSIS — Z30011 Encounter for initial prescription of contraceptive pills: Secondary | ICD-10-CM | POA: Insufficient documentation

## 2023-04-30 DIAGNOSIS — Z3009 Encounter for other general counseling and advice on contraception: Secondary | ICD-10-CM | POA: Insufficient documentation

## 2023-04-30 DIAGNOSIS — N926 Irregular menstruation, unspecified: Secondary | ICD-10-CM | POA: Insufficient documentation

## 2023-04-30 DIAGNOSIS — E1169 Type 2 diabetes mellitus with other specified complication: Secondary | ICD-10-CM | POA: Insufficient documentation

## 2023-04-30 DIAGNOSIS — Z1231 Encounter for screening mammogram for malignant neoplasm of breast: Secondary | ICD-10-CM | POA: Insufficient documentation

## 2023-04-30 DIAGNOSIS — Z1159 Encounter for screening for other viral diseases: Secondary | ICD-10-CM | POA: Insufficient documentation

## 2023-04-30 DIAGNOSIS — Z0000 Encounter for general adult medical examination without abnormal findings: Secondary | ICD-10-CM | POA: Insufficient documentation

## 2023-04-30 DIAGNOSIS — I499 Cardiac arrhythmia, unspecified: Secondary | ICD-10-CM | POA: Insufficient documentation

## 2023-04-30 DIAGNOSIS — E538 Deficiency of other specified B group vitamins: Secondary | ICD-10-CM | POA: Insufficient documentation

## 2023-04-30 NOTE — Assessment & Plan Note (Signed)
History of PCOS LMP 02/07/23 Currently sexually active Has been off current OCP

## 2023-04-30 NOTE — Assessment & Plan Note (Signed)
Irregular menses Check UPT Consider OCP or other forms of birth control in near future

## 2023-04-30 NOTE — Assessment & Plan Note (Signed)
Recommend discontinuing Sprintec given increase in BP Consider Micronor if UPT negative

## 2023-04-30 NOTE — Assessment & Plan Note (Signed)
Blood pressure reading of 170/96 today, history of high readings at home. -Order kidney function tests. -Consider starting Losartan or similar medication, pending lab results.

## 2023-04-30 NOTE — Assessment & Plan Note (Signed)
Possible heart murmur noted on exam, history of palpitations. -Order EKG today. EKG: normal EKG, normal sinus rhythm.

## 2023-04-30 NOTE — Assessment & Plan Note (Signed)
History of gestational diabetes, converted to DM T2 shortly thereafter last pregnancy, currently off Metformin with home glucose readings occasionally reaching 150-200. Last A1c was 6.0, but previously as high as 6.8. Patient reports previous intolerance to Metformin. -Order point of care A1c today. -Order fasting labs including kidney function. -Consider starting GLP-1 agonist for blood glucose control and weight loss, pending lab results.

## 2023-04-30 NOTE — Assessment & Plan Note (Signed)
History of moderate fatty liver disease, no recent lipid profile. -Order fasting lipid profile. -Consider starting statin therapy, pending lab results.

## 2023-05-03 ENCOUNTER — Other Ambulatory Visit (INDEPENDENT_AMBULATORY_CARE_PROVIDER_SITE_OTHER): Payer: BC Managed Care – PPO

## 2023-05-03 DIAGNOSIS — E1159 Type 2 diabetes mellitus with other circulatory complications: Secondary | ICD-10-CM | POA: Diagnosis not present

## 2023-05-03 DIAGNOSIS — I152 Hypertension secondary to endocrine disorders: Secondary | ICD-10-CM | POA: Diagnosis not present

## 2023-05-03 DIAGNOSIS — Z7985 Long-term (current) use of injectable non-insulin antidiabetic drugs: Secondary | ICD-10-CM | POA: Diagnosis not present

## 2023-05-04 LAB — BASIC METABOLIC PANEL
BUN: 8 mg/dL (ref 6–23)
CO2: 27 meq/L (ref 19–32)
Calcium: 9.6 mg/dL (ref 8.4–10.5)
Chloride: 99 meq/L (ref 96–112)
Creatinine, Ser: 0.68 mg/dL (ref 0.40–1.20)
GFR: 108.09 mL/min (ref >60.00)
Glucose, Bld: 113 mg/dL — ABNORMAL HIGH (ref 70–99)
Potassium: 3.8 meq/L (ref 3.5–5.1)
Sodium: 136 meq/L (ref 135–145)

## 2023-05-05 ENCOUNTER — Encounter: Payer: Self-pay | Admitting: Family Medicine

## 2023-05-05 DIAGNOSIS — K529 Noninfective gastroenteritis and colitis, unspecified: Secondary | ICD-10-CM | POA: Insufficient documentation

## 2023-05-05 DIAGNOSIS — L600 Ingrowing nail: Secondary | ICD-10-CM | POA: Insufficient documentation

## 2023-05-05 NOTE — Assessment & Plan Note (Addendum)
Discussed need for contraception if starting Losartan. Recent UPT negative.  Has not had sexual relations since last visit. -Start Micronor (progesterone-only pill) to prevent pregnancy.  -Discussed with patient teratogenic effects of ARB if possible pregnancy and offered Amlodipine.  Patient not wanting pregnancy and indicates compliancy with OCP.

## 2023-05-05 NOTE — Assessment & Plan Note (Addendum)
Daily to every other day diarrhea for the past 3 months. Nonbloody, no mucus and no recent travel. -Recommend change in dietary habits -Collect stool samples for further evaluation if no improvement at next visit.  Recent labs reassuring for no electrolyte imbalance or infection -If symptoms persist, consider referral to GI.

## 2023-05-05 NOTE — Assessment & Plan Note (Signed)
Blood pressure consistently above target range. Discussed starting Losartan, but potential pregnancy is a concern. -Start Losartan 25mg  at night. -Check blood work 1 week after starting Losartan.

## 2023-05-05 NOTE — Assessment & Plan Note (Signed)
Elevated A1c. Previous intolerance to Metformin. Discussed options of Ozempic or Rybelsus. -Start Ozempic, dose based on A1c, not weight. -Follow up in 4 weeks to assess response. -Start ARB/ Statin therapy -Foot exam due, referral sent to podiatry -Recommend annual eye exam

## 2023-05-05 NOTE — Assessment & Plan Note (Signed)
Low ferritin stores, but normal hemoglobin and iron levels. -Increase dietary iron intake.

## 2023-05-05 NOTE — Assessment & Plan Note (Signed)
Elevated triglycerides and low HDL. Discussed the benefits of Crestor in reducing triglycerides and cholesterol. -Start Crestor 10mg  to lower cholesterol and triglycerides.

## 2023-05-05 NOTE — Assessment & Plan Note (Signed)
Low vitamin D levels. -Start Vitamin D 1.25mg  weekly for 6 months, then switch to daily Vitamin D. -Recheck Vitamin D levels in 8-9 months.

## 2023-05-17 ENCOUNTER — Ambulatory Visit: Payer: BC Managed Care – PPO | Admitting: Family Medicine

## 2023-05-21 ENCOUNTER — Ambulatory Visit: Payer: BC Managed Care – PPO | Admitting: Family Medicine

## 2023-05-22 ENCOUNTER — Other Ambulatory Visit: Payer: Self-pay | Admitting: Family Medicine

## 2023-05-22 DIAGNOSIS — E118 Type 2 diabetes mellitus with unspecified complications: Secondary | ICD-10-CM

## 2023-05-24 ENCOUNTER — Encounter: Payer: Self-pay | Admitting: Family Medicine

## 2023-05-24 ENCOUNTER — Ambulatory Visit: Payer: BC Managed Care – PPO | Admitting: Family Medicine

## 2023-05-24 VITALS — BP 122/80 | HR 75 | Temp 98.0°F | Resp 18 | Ht 64.0 in | Wt 166.0 lb

## 2023-05-24 DIAGNOSIS — E1169 Type 2 diabetes mellitus with other specified complication: Secondary | ICD-10-CM

## 2023-05-24 DIAGNOSIS — E1159 Type 2 diabetes mellitus with other circulatory complications: Secondary | ICD-10-CM | POA: Diagnosis not present

## 2023-05-24 DIAGNOSIS — Z30011 Encounter for initial prescription of contraceptive pills: Secondary | ICD-10-CM

## 2023-05-24 DIAGNOSIS — E785 Hyperlipidemia, unspecified: Secondary | ICD-10-CM

## 2023-05-24 DIAGNOSIS — M62830 Muscle spasm of back: Secondary | ICD-10-CM

## 2023-05-24 DIAGNOSIS — M549 Dorsalgia, unspecified: Secondary | ICD-10-CM

## 2023-05-24 DIAGNOSIS — G8929 Other chronic pain: Secondary | ICD-10-CM

## 2023-05-24 DIAGNOSIS — I152 Hypertension secondary to endocrine disorders: Secondary | ICD-10-CM

## 2023-05-24 DIAGNOSIS — Z7985 Long-term (current) use of injectable non-insulin antidiabetic drugs: Secondary | ICD-10-CM

## 2023-05-24 DIAGNOSIS — E118 Type 2 diabetes mellitus with unspecified complications: Secondary | ICD-10-CM

## 2023-05-24 MED ORDER — ROSUVASTATIN CALCIUM 10 MG PO TABS: 10.0000 mg | ORAL_TABLET | Freq: Every evening | ORAL | 3 refills | Status: DC

## 2023-05-24 MED ORDER — BACLOFEN 10 MG PO TABS: 5.0000 mg | ORAL_TABLET | Freq: Every evening | ORAL | 0 refills | Status: DC | PRN

## 2023-05-24 MED ORDER — SEMAGLUTIDE(0.25 OR 0.5MG/DOS) 2 MG/3ML ~~LOC~~ SOPN: 0.2500 mg | PEN_INJECTOR | SUBCUTANEOUS | 0 refills | Status: DC

## 2023-05-24 MED ORDER — LOSARTAN POTASSIUM 25 MG PO TABS: 25.0000 mg | ORAL_TABLET | Freq: Every evening | ORAL | 3 refills | Status: DC

## 2023-05-24 MED ORDER — LOSARTAN POTASSIUM 25 MG PO TABS: 25.0000 mg | ORAL_TABLET | Freq: Every evening | ORAL | 0 refills | Status: DC

## 2023-05-24 NOTE — Patient Instructions (Addendum)
It was a pleasure meeting you today. Thank you for allowing me to take part in your health care.  Our goals for today as we discussed include:  Continue Ozempic 0.25 mg weekly  Refills sent for all medications  Start Baclofen 5 mg at night for muscle spasm  PAP due.  Schedule appointment at earliest convenience   This is a list of the screening recommended for you and due dates:  Health Maintenance  Topic Date Due   Complete foot exam   Never done   Eye exam for diabetics  Never done   Pap with HPV screening  10/24/2019   Flu Shot  09/06/2023*   Hemoglobin A1C  10/19/2023   Yearly kidney health urinalysis for diabetes  04/20/2024   Yearly kidney function blood test for diabetes  05/02/2024   DTaP/Tdap/Td vaccine (2 - Td or Tdap) 12/01/2028   Hepatitis C Screening  Completed   HIV Screening  Completed   HPV Vaccine  Aged Out   COVID-19 Vaccine  Discontinued  *Topic was postponed. The date shown is not the original due date.     If you have any questions or concerns, please do not hesitate to call the office at [phone].  I look forward to our next visit and until then take care and stay safe.  Regards,   Dana Allan, MD   [facility]

## 2023-05-24 NOTE — Progress Notes (Unsigned)
SUBJECTIVE:   Chief Complaint  Patient presents with   Diabetes   HPI Presents to clinic for follow up chronic disease management  Discussed the use of AI scribe software for clinical note transcription with the patient, who gave verbal consent to proceed.  History of Present Illness The patient, with a history of degenerative disc disease, presents with chronic back pain and recent neck pain. They report a long-standing history of bulging and herniated discs, which have been managed conservatively due to their young age and reluctance to undergo surgery. The patient has been prescribed oxycodone for pain management, but they express concern about the side effects and their ability to care for their four children while on this medication.  The patient's back pain has been flaring up recently, causing radiating pain up to their neck, which they describe as having a persistent 'crick.' This pain has been present for about a week and has made neck movement difficult. They have previously tried physical therapy and massage for their back issues, but these have not provided significant relief.  The patient also reports occasional numbness and tingling in their hands, which they attribute to their back issues. They describe an incident where moving their arm in a certain way caused a shooting pain down the arm, followed by numbness.  The patient's back issues began at the age of [age] and were diagnosed at 42. They have had two episodes in their life where they were unable to walk due to the severity of their back pain. They have been managing their condition by being careful with how they bend and lift.  The patient's family has a history of back issues, with both their mother and sister having undergone back surgery. The patient is considering their options for further management of their back pain, including potential surgical interventions.    The patient reports that she is  tolerating Ozempic well at current dose.  No low blood glucose levels.  They also report tolerating Losartan and not checking blood pressure regularly at home.  Denies any chest pain, shortness of breath or lower extremity edema.    Tolerating Crestor daily.   PERTINENT PMH / PSH: As above  OBJECTIVE:  BP 122/80   Pulse 75   Temp 98 F (36.7 C)   Resp 18   Ht 5\' 4"  (1.626 m)   Wt 166 lb (75.3 kg)   SpO2 98%   BMI 28.49 kg/m    Physical Exam Vitals reviewed.  Constitutional:      General: She is not in acute distress.    Appearance: Normal appearance. She is normal weight. She is not ill-appearing, toxic-appearing or diaphoretic.  HENT:     Mouth/Throat:     Mouth: Mucous membranes are moist.  Eyes:     General:        Right eye: No discharge.        Left eye: No discharge.     Conjunctiva/sclera: Conjunctivae normal.  Cardiovascular:     Rate and Rhythm: Normal rate and regular rhythm.     Heart sounds: Normal heart sounds.  Pulmonary:     Effort: Pulmonary effort is normal.     Breath sounds: Normal breath sounds.  Abdominal:     General: Bowel sounds are normal.  Musculoskeletal:        General: Normal range of motion.     Cervical back: Spasms and tenderness present. No swelling, deformity, rigidity or bony tenderness. Normal range of motion.  Thoracic back: Normal.     Lumbar back: Normal.  Skin:    General: Skin is warm and dry.  Neurological:     General: No focal deficit present.     Mental Status: She is alert and oriented to person, place, and time. Mental status is at baseline.  Psychiatric:        Mood and Affect: Mood normal.        Behavior: Behavior normal.        Thought Content: Thought content normal.        Judgment: Judgment normal.        05/24/2023   11:25 AM 04/26/2023    4:02 PM 04/21/2023    2:07 PM 11/25/2021   11:54 AM 03/06/2020    9:57 AM  Depression screen PHQ 2/9  Decreased Interest 0 0 0 0 0  Down, Depressed, Hopeless  0 0 0 0 0  PHQ - 2 Score 0 0 0 0 0  Altered sleeping 0 1 2    Tired, decreased energy 1 1 3     Change in appetite 0 1 0    Feeling bad or failure about yourself  0 0 0    Trouble concentrating 0 1 0    Moving slowly or fidgety/restless 0 0 0    Suicidal thoughts 0 0 0    PHQ-9 Score 1 4 5     Difficult doing work/chores Not difficult at all Not difficult at all Somewhat difficult        05/24/2023   11:25 AM 04/26/2023    4:03 PM 04/21/2023    2:07 PM  GAD 7 : Generalized Anxiety Score  Nervous, Anxious, on Edge 0 2 1  Control/stop worrying 0 1 1  Worry too much - different things 0 0 0  Trouble relaxing 0 0 1  Restless 0 0 2  Easily annoyed or irritable 0 1 2  Afraid - awful might happen 0 0 0  Total GAD 7 Score 0 4 7  Anxiety Difficulty Not difficult at all Not difficult at all Somewhat difficult    ASSESSMENT/PLAN:  Type 2 diabetes mellitus with complications (HCC) Assessment & Plan: Recent initiation of Ozempic 0.25mg  with good tolerance and weight loss. Blood sugars appear to be controlled based on patient's self-monitoring. -Continue Ozempic 0.25mg  and monitor blood sugars at home.  Orders: -     Semaglutide(0.25 or 0.5MG /DOS); Inject 0.25 mg into the skin once a week.  Dispense: 3 mL; Refill: 0  Hypertension associated with diabetes (HCC) Assessment & Plan: Blood pressure slightly elevated at the visit, but patient reports normal readings at home. -Continue current antihypertensive regimen and monitor blood pressure at home.  Orders: -     Losartan Potassium; Take 1 tablet (25 mg total) by mouth at bedtime.  Dispense: 90 tablet; Refill: 3  Hyperlipidemia associated with type 2 diabetes mellitus (HCC) Assessment & Plan: Tolerating Rosuvastatin 10mg  without any muscle aches or pains. -Continue Rosuvastatin 10mg .  Orders: -     Rosuvastatin Calcium; Take 1 tablet (10 mg total) by mouth every evening.  Dispense: 90 tablet; Refill: 3  Muscle spasm of  back Assessment & Plan: Chronic back pain with occasional flare-ups causing neck pain and numbness in hands. Patient has a history of bulging and herniated discs. Currently managed with oxycodone, but patient is seeking alternatives due to side effects. -Trial of Baclofen for muscle relaxation and pain management.  Orders: -     Baclofen; Take 0.5 tablets (5  mg total) by mouth at bedtime as needed for muscle spasms.  Dispense: 30 each; Refill: 0  Chronic back pain, unspecified back location, unspecified back pain laterality Assessment & Plan: Degenerative Disc Disease Chronic back pain with occasional flare-ups causing neck pain and numbness in hands. Patient has a history of bulging and herniated discs. Currently managed with oxycodone, but patient is seeking alternatives due to side effects. -Trial of Baclofen for muscle relaxation and pain management. -Suggested consideration of nerve stimulator for pain management   BCP (birth control pills) initiation Assessment & Plan: Recently started on Micronor and tolerating well. -Continue Micronor, with emphasis on consistent daily use and additional barrier contraception due to concurrent use of Losartan.     PDMP reviewed  Return in about 6 months (around 11/22/2023) for PCP, DM, HTN.  Dana Allan, MD

## 2023-05-27 ENCOUNTER — Encounter: Payer: Self-pay | Admitting: Family Medicine

## 2023-05-27 DIAGNOSIS — G8929 Other chronic pain: Secondary | ICD-10-CM | POA: Insufficient documentation

## 2023-05-27 DIAGNOSIS — M62830 Muscle spasm of back: Secondary | ICD-10-CM | POA: Insufficient documentation

## 2023-05-27 DIAGNOSIS — M51369 Other intervertebral disc degeneration, lumbar region without mention of lumbar back pain or lower extremity pain: Secondary | ICD-10-CM | POA: Insufficient documentation

## 2023-05-27 NOTE — Assessment & Plan Note (Signed)
Recently started on Micronor and tolerating well. -Continue Micronor, with emphasis on consistent daily use and additional barrier contraception due to concurrent use of Losartan.

## 2023-05-27 NOTE — Assessment & Plan Note (Signed)
Blood pressure slightly elevated at the visit, but patient reports normal readings at home. -Continue current antihypertensive regimen and monitor blood pressure at home.

## 2023-05-27 NOTE — Assessment & Plan Note (Signed)
Degenerative Disc Disease Chronic back pain with occasional flare-ups causing neck pain and numbness in hands. Patient has a history of bulging and herniated discs. Currently managed with oxycodone, but patient is seeking alternatives due to side effects. -Trial of Baclofen for muscle relaxation and pain management. -Suggested consideration of nerve stimulator for pain management

## 2023-05-27 NOTE — Assessment & Plan Note (Signed)
Recent initiation of Ozempic 0.25mg  with good tolerance and weight loss. Blood sugars appear to be controlled based on patient's self-monitoring. -Continue Ozempic 0.25mg  and monitor blood sugars at home.

## 2023-05-27 NOTE — Assessment & Plan Note (Deleted)
Recently started on Micronor and tolerating well. -Continue Micronor, with emphasis on consistent daily use and additional barrier contraception due to concurrent use of Losartan.

## 2023-05-27 NOTE — Assessment & Plan Note (Signed)
Tolerating Rosuvastatin 10mg  without any muscle aches or pains. -Continue Rosuvastatin 10mg .

## 2023-05-27 NOTE — Assessment & Plan Note (Signed)
Chronic back pain with occasional flare-ups causing neck pain and numbness in hands. Patient has a history of bulging and herniated discs. Currently managed with oxycodone, but patient is seeking alternatives due to side effects. -Trial of Baclofen for muscle relaxation and pain management.

## 2023-06-11 ENCOUNTER — Encounter: Payer: Self-pay | Admitting: Family Medicine

## 2023-06-15 ENCOUNTER — Other Ambulatory Visit: Payer: Self-pay | Admitting: Family Medicine

## 2023-06-15 DIAGNOSIS — M62830 Muscle spasm of back: Secondary | ICD-10-CM

## 2023-06-18 ENCOUNTER — Other Ambulatory Visit: Payer: Self-pay | Admitting: Family Medicine

## 2023-06-18 DIAGNOSIS — E118 Type 2 diabetes mellitus with unspecified complications: Secondary | ICD-10-CM

## 2023-08-16 ENCOUNTER — Ambulatory Visit (INDEPENDENT_AMBULATORY_CARE_PROVIDER_SITE_OTHER): Admitting: Family Medicine

## 2023-08-16 ENCOUNTER — Encounter: Payer: Self-pay | Admitting: Family Medicine

## 2023-08-16 VITALS — BP 124/84 | HR 64 | Temp 97.9°F | Resp 20 | Ht 64.0 in | Wt 167.4 lb

## 2023-08-16 DIAGNOSIS — E1159 Type 2 diabetes mellitus with other circulatory complications: Secondary | ICD-10-CM | POA: Diagnosis not present

## 2023-08-16 DIAGNOSIS — I152 Hypertension secondary to endocrine disorders: Secondary | ICD-10-CM | POA: Diagnosis not present

## 2023-08-16 DIAGNOSIS — E118 Type 2 diabetes mellitus with unspecified complications: Secondary | ICD-10-CM | POA: Diagnosis not present

## 2023-08-16 DIAGNOSIS — Z7985 Long-term (current) use of injectable non-insulin antidiabetic drugs: Secondary | ICD-10-CM | POA: Diagnosis not present

## 2023-08-16 LAB — POCT GLYCOSYLATED HEMOGLOBIN (HGB A1C): Hemoglobin A1C: 7.2 % — AB (ref 4.0–5.6)

## 2023-08-16 MED ORDER — OZEMPIC (0.25 OR 0.5 MG/DOSE) 2 MG/3ML ~~LOC~~ SOPN: 0.5000 mg | PEN_INJECTOR | SUBCUTANEOUS | 1 refills | Status: DC

## 2023-08-16 NOTE — Progress Notes (Signed)
 SUBJECTIVE:   Chief Complaint  Patient presents with   Diabetes    Fasting BS 150-160   HPI Presents for follow up chronic disease management  Discussed the use of AI scribe software for clinical note transcription with the patient, who gave verbal consent to proceed.  History of Present Illness The patient presents for a follow-up regarding diabetes management.  Her home blood sugar levels range from 150 to 180 mg/dL. She is currently on Ozempic 0.25 mg, which she started in November and has tolerated well. The dose has not been increased due to missed follow-up appointments. Her last A1c was 7.3%.  She is concerned about her insurance not covering Ozempic, which costs her $225. She has [employer] with a high deductible plan of $5,000, chosen to cover herself and her four children. Last year, her deductible was met early due to surgeries, which might explain why she did not have to pay for medications then. She is considering other medication options if Ozempic becomes too expensive.  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 She acknowledges that diabetes management works better with diet and exercise, although maintaining regular physical activity is difficult due to her busy lifestyle.  No chest pain, shortness of breath, or swelling of her legs. She confirms adequate water intake.      PERTINENT PMH / PSH: As above  OBJECTIVE:  BP 124/84   Pulse 64   Temp 97.9 F (36.6 C)   Resp 20   Ht 5\' 4"  (1.626 m)   Wt 167 lb 6 oz (75.9 kg)   LMP 08/03/2023 (Approximate)   SpO2 98%   BMI 28.73 kg/m    Physical Exam Vitals reviewed.  Constitutional:      General: She is not in acute distress.    Appearance: Normal appearance. She is not ill-appearing, toxic-appearing or diaphoretic.  Eyes:     General:        Right eye: No discharge.        Left  eye: No discharge.     Conjunctiva/sclera: Conjunctivae normal.  Cardiovascular:     Rate and Rhythm: Normal rate and regular rhythm.     Heart sounds: Normal heart sounds.  Pulmonary:     Effort: Pulmonary effort is normal.     Breath sounds: Normal breath sounds.  Abdominal:     General: Bowel sounds are normal.  Musculoskeletal:        General: Normal range of motion.  Skin:    General: Skin is warm and dry.  Neurological:     General: No focal deficit present.     Mental Status: She is alert and oriented to person, place, and time. Mental status is at baseline.  Psychiatric:        Mood and Affect: Mood normal.        Behavior: Behavior normal.        Thought Content: Thought content normal.        Judgment: Judgment normal.           08/16/2023   11:08 AM 05/24/2023   11:25 AM 04/26/2023    4:02 PM 04/21/2023    2:07 PM 11/25/2021   11:54 AM  Depression screen PHQ 2/9  Decreased Interest 0 0 0 0 0  Down, Depressed, Hopeless 0 0 0 0 0  PHQ - 2 Score 0 0 0  0 0  Altered sleeping 1 0 1 2   Tired, decreased energy 3 1 1 3    Change in appetite 0 0 1 0   Feeling bad or failure about yourself  0 0 0 0   Trouble concentrating 0 0 1 0   Moving slowly or fidgety/restless 0 0 0 0   Suicidal thoughts 0 0 0 0   PHQ-9 Score 4 1 4 5    Difficult doing work/chores Somewhat difficult Not difficult at all Not difficult at all Somewhat difficult       08/16/2023   11:08 AM 05/24/2023   11:25 AM 04/26/2023    4:03 PM 04/21/2023    2:07 PM  GAD 7 : Generalized Anxiety Score  Nervous, Anxious, on Edge 0 0 2 1  Control/stop worrying 0 0 1 1  Worry too much - different things 0 0 0 0  Trouble relaxing 0 0 0 1  Restless 1 0 0 2  Easily annoyed or irritable 1 0 1 2  Afraid - awful might happen 0 0 0 0  Total GAD 7 Score 2 0 4 7  Anxiety Difficulty Not difficult at all Not difficult at all Not difficult at all Somewhat difficult    ASSESSMENT/PLAN:  Type 2 diabetes mellitus  with complications (HCC) Assessment & Plan: Elevated fasting glucose (150-180 mg/dL) with suboptimal control (HbA1c 7.3%). On Ozempic 0.25 mg since November. Discussed medication, diet, exercise, and cost concerns. Considered alternatives if cost remains an issue. - Increase Ozempic to 0.5 mg weekly. - Check HbA1c today. - Follow up in 4 weeks to assess glucose levels and medication efficacy. - Encourage contacting insurance regarding coverage and deductible issues. - Discuss potential financial assistance options with office staff. - Encourage healthy diet and regular physical activity.   Orders: -     POCT glycosylated hemoglobin (Hb A1C) -     Ozempic (0.25 or 0.5 MG/DOSE); Inject 0.5 mg into the skin once a week.  Dispense: 6 mL; Refill: 1  Hypertension associated with diabetes (HCC) Assessment & Plan: Well controlled  -Continue Losartan 25 mg daily  Orders: -     Ozempic (0.25 or 0.5 MG/DOSE); Inject 0.5 mg into the skin once a week.  Dispense: 6 mL; Refill: 1    PDMP reviewed  Return in about 4 weeks (around 09/13/2023) for PCP.  Dana Allan, MD

## 2023-08-16 NOTE — Assessment & Plan Note (Signed)
 Elevated fasting glucose (150-180 mg/dL) with suboptimal control (HbA1c 7.3%). On Ozempic 0.25 mg since November. Discussed medication, diet, exercise, and cost concerns. Considered alternatives if cost remains an issue. - Increase Ozempic to 0.5 mg weekly. - Check HbA1c today. - Follow up in 4 weeks to assess glucose levels and medication efficacy. - Encourage contacting insurance regarding coverage and deductible issues. - Discuss potential financial assistance options with office staff. - Encourage healthy diet and regular physical activity.

## 2023-08-16 NOTE — Patient Instructions (Addendum)
 It was a pleasure meeting you today. Thank you for allowing me to take part in your health care.  Our goals for today as we discussed include:  A1c today 7.2  Increase Ozempic to 0.5 mg weekly  Check with insurance to see what deductible is   Follow up in 4 weeks   This is a list of the screening recommended for you and due dates:  Health Maintenance  Topic Date Due   Pneumococcal Vaccination (1 of 2 - PCV) Never done   Eye exam for diabetics  Never done   Pap with HPV screening  10/24/2019   Flu Shot  09/06/2023*   Hemoglobin A1C  10/19/2023   Yearly kidney health urinalysis for diabetes  04/20/2024   Yearly kidney function blood test for diabetes  05/02/2024   Complete foot exam   05/23/2024   DTaP/Tdap/Td vaccine (2 - Td or Tdap) 12/01/2028   Hepatitis C Screening  Completed   HIV Screening  Completed   HPV Vaccine  Aged Out   COVID-19 Vaccine  Discontinued  *Topic was postponed. The date shown is not the original due date.     If you have any questions or concerns, please do not hesitate to call the office at [phone].  I look forward to our next visit and until then take care and stay safe.  Regards,   Dana Allan, MD   [facility]

## 2023-08-16 NOTE — Assessment & Plan Note (Signed)
Well-controlled. Continue Losartan 25 mg daily.

## 2023-09-13 ENCOUNTER — Ambulatory Visit (INDEPENDENT_AMBULATORY_CARE_PROVIDER_SITE_OTHER): Admitting: Family Medicine

## 2023-09-13 ENCOUNTER — Other Ambulatory Visit: Payer: Self-pay | Admitting: Family Medicine

## 2023-09-13 ENCOUNTER — Encounter: Payer: Self-pay | Admitting: Family Medicine

## 2023-09-13 ENCOUNTER — Telehealth: Payer: Self-pay

## 2023-09-13 VITALS — BP 126/86 | HR 83 | Temp 98.4°F | Resp 20 | Ht 64.0 in | Wt 167.0 lb

## 2023-09-13 DIAGNOSIS — R1010 Upper abdominal pain, unspecified: Secondary | ICD-10-CM

## 2023-09-13 DIAGNOSIS — M79601 Pain in right arm: Secondary | ICD-10-CM | POA: Diagnosis not present

## 2023-09-13 DIAGNOSIS — Z7985 Long-term (current) use of injectable non-insulin antidiabetic drugs: Secondary | ICD-10-CM | POA: Diagnosis not present

## 2023-09-13 DIAGNOSIS — E118 Type 2 diabetes mellitus with unspecified complications: Secondary | ICD-10-CM | POA: Diagnosis not present

## 2023-09-13 LAB — COMPREHENSIVE METABOLIC PANEL WITH GFR
ALT: 21 U/L (ref 0–35)
AST: 16 U/L (ref 0–37)
Albumin: 4.3 g/dL (ref 3.5–5.2)
Alkaline Phosphatase: 71 U/L (ref 39–117)
BUN: 11 mg/dL (ref 6–23)
CO2: 26 meq/L (ref 19–32)
Calcium: 9 mg/dL (ref 8.4–10.5)
Chloride: 103 meq/L (ref 96–112)
Creatinine, Ser: 0.58 mg/dL (ref 0.40–1.20)
GFR: 112.03 mL/min (ref >60.00)
Glucose, Bld: 149 mg/dL — ABNORMAL HIGH (ref 70–99)
Potassium: 3.8 meq/L (ref 3.5–5.1)
Sodium: 137 meq/L (ref 135–145)
Total Bilirubin: 0.6 mg/dL (ref 0.2–1.2)
Total Protein: 7 g/dL (ref 6.0–8.3)

## 2023-09-13 LAB — CBC WITH DIFFERENTIAL/PLATELET
Basophils Absolute: 0 10*3/uL (ref 0.0–0.1)
Basophils Relative: 0.6 % (ref 0.0–3.0)
Eosinophils Absolute: 0.1 10*3/uL (ref 0.0–0.7)
Eosinophils Relative: 1.3 % (ref 0.0–5.0)
HCT: 40.9 % (ref 36.0–46.0)
Hemoglobin: 13.8 g/dL (ref 12.0–15.0)
Lymphocytes Relative: 25.6 % (ref 12.0–46.0)
Lymphs Abs: 2.1 10*3/uL (ref 0.7–4.0)
MCHC: 33.8 g/dL (ref 30.0–36.0)
MCV: 89.2 fl (ref 78.0–100.0)
Monocytes Absolute: 0.4 10*3/uL (ref 0.1–1.0)
Monocytes Relative: 4.9 % (ref 3.0–12.0)
Neutro Abs: 5.5 10*3/uL (ref 1.4–7.7)
Neutrophils Relative %: 67.6 % (ref 43.0–77.0)
Platelets: 236 10*3/uL (ref 150.0–400.0)
RBC: 4.58 Mil/uL (ref 3.87–5.11)
RDW: 13.1 % (ref 11.5–15.5)
WBC: 8.1 10*3/uL (ref 4.0–10.5)

## 2023-09-13 LAB — LIPASE: Lipase: 24 U/L (ref 11.0–59.0)

## 2023-09-13 MED ORDER — SEMAGLUTIDE (2 MG/DOSE) 8 MG/3ML ~~LOC~~ SOPN: 1.0000 mg | PEN_INJECTOR | SUBCUTANEOUS | 1 refills | Status: DC

## 2023-09-13 NOTE — Telephone Encounter (Signed)
 Left message to call the office back regarding the lab result below. Okay for E2C2 to give the lab result.

## 2023-09-13 NOTE — Progress Notes (Signed)
 SUBJECTIVE:   Chief Complaint  Patient presents with   Diabetes   HPI Presents for follow Discussed the use of AI scribe software for clinical note transcription with the patient, who gave verbal consent to proceed.  History of Present Illness Amber Foster is a 43 year old female with diabetes who presents for follow-up of her blood sugar control.  Blood sugar levels at home have been variable, with elevated levels in the mornings. Despite a blood sugar level of 100 mg/dL after dinner, it rises to 152 mg/dL by morning. She has been on a 0.5 mg dose of her diabetes medication for four weeks, tolerating it well without side effects. Her last HbA1c was 7.2, and it is too early to recheck it as it has only been four weeks since the last check.  She experiences intermittent abdominal pain, nausea, and a sensation of tightness in the abdomen, similar to symptoms experienced prior to her previous hernia surgery in 2021. The pain is located in the upper abdomen and is associated with nausea and a feeling of fullness. No recent trauma or heavy lifting. Normal bowel movements but describes them as soft stool or diarrhea.  She reports chronic arm pain, described as a snapping sensation, possibly related to a past flu shot. The pain is not located in the shoulder but causes significant discomfort.    PERTINENT PMH / PSH: As above  OBJECTIVE:  BP 126/86   Pulse 83   Temp 98.4 F (36.9 C)   Resp 20   Ht 5\' 4"  (1.626 m)   Wt 167 lb (75.8 kg)   LMP 08/31/2023 (Approximate)   SpO2 99%   BMI 28.67 kg/m    Physical Exam Vitals reviewed.  Constitutional:      General: She is not in acute distress.    Appearance: Normal appearance. She is not ill-appearing, toxic-appearing or diaphoretic.  Eyes:     General:        Right eye: No discharge.        Left eye: No discharge.     Conjunctiva/sclera: Conjunctivae normal.  Cardiovascular:     Rate and Rhythm: Normal rate and regular rhythm.      Heart sounds: Normal heart sounds.  Pulmonary:     Effort: Pulmonary effort is normal.     Breath sounds: Normal breath sounds.  Abdominal:     General: Bowel sounds are normal.     Palpations: Abdomen is soft. There is no hepatomegaly, splenomegaly or pulsatile mass.     Tenderness: There is generalized abdominal tenderness and tenderness in the right upper quadrant, epigastric area and left upper quadrant.  Musculoskeletal:        General: Normal range of motion.     Right shoulder: Normal.     Right upper arm: Tenderness present. No swelling, edema or bony tenderness.     Left upper arm: Normal.  Skin:    General: Skin is warm and dry.  Neurological:     General: No focal deficit present.     Mental Status: She is alert and oriented to person, place, and time. Mental status is at baseline.  Psychiatric:        Mood and Affect: Mood normal.        Behavior: Behavior normal.        Thought Content: Thought content normal.        Judgment: Judgment normal.           09/13/2023  8:50 AM 08/16/2023   11:08 AM 05/24/2023   11:25 AM 04/26/2023    4:02 PM 04/21/2023    2:07 PM  Depression screen PHQ 2/9  Decreased Interest 0 0 0 0 0  Down, Depressed, Hopeless 0 0 0 0 0  PHQ - 2 Score 0 0 0 0 0  Altered sleeping 0 1 0 1 2  Tired, decreased energy 0 3 1 1 3   Change in appetite 0 0 0 1 0  Feeling bad or failure about yourself  0 0 0 0 0  Trouble concentrating 0 0 0 1 0  Moving slowly or fidgety/restless 0 0 0 0 0  Suicidal thoughts 0 0 0 0 0  PHQ-9 Score 0 4 1 4 5   Difficult doing work/chores Not difficult at all Somewhat difficult Not difficult at all Not difficult at all Somewhat difficult      08/16/2023   11:08 AM 05/24/2023   11:25 AM 04/26/2023    4:03 PM 04/21/2023    2:07 PM  GAD 7 : Generalized Anxiety Score  Nervous, Anxious, on Edge 0 0 2 1  Control/stop worrying 0 0 1 1  Worry too much - different things 0 0 0 0  Trouble relaxing 0 0 0 1  Restless 1 0 0 2   Easily annoyed or irritable 1 0 1 2  Afraid - awful might happen 0 0 0 0  Total GAD 7 Score 2 0 4 7  Anxiety Difficulty Not difficult at all Not difficult at all Not difficult at all Somewhat difficult    ASSESSMENT/PLAN:  Type 2 diabetes mellitus with complications (HCC) Assessment & Plan: Suboptimal glycemic control with morning hyperglycemia. Current A1c is 7.2%. - Increase medication dosage to 1 mg. - Monitor blood glucose levels regularly. - Reassess A1c in 2-3 months. - Educate on dietary modifications to manage blood glucose levels.   Pain of upper abdomen Assessment & Plan: Intermittent upper abdominal pain with nausea, similar to previous hernia symptoms. Differential includes hernia recurrence or other abdominal pathology. - Order CT scan of the abdomen to evaluate for hernia recurrence or other pathology. - Consider referral to a specialist if CT scan indicates hernia recurrence.  Orders: -     CT ABDOMEN PELVIS WO CONTRAST; Future -     Comprehensive metabolic panel with GFR -     Lipase -     CBC with Differential/Platelet  Arm pain, anterior, right Assessment & Plan: Chronic arm pain post-flu shot in 2012, described as a snapping sensation with occasional radiation to the head.  - Referral to sports medicine specialist   Orders: -     Ambulatory referral to Sports Medicine    PDMP reviewed  Return if symptoms worsen or fail to improve, for PCP.  Valli Gaw, MD

## 2023-09-13 NOTE — Patient Instructions (Addendum)
 It was a pleasure meeting you today. Thank you for allowing me to take part in your health care.  Our goals for today as we discussed include:  We will get some labs today.  If they are abnormal or we need to do something about them, I will call you.  If they are normal, I will send you a message on MyChart (if it is active) or a letter in the mail.  If you don't hear from [place] in 2 weeks, please call the office at the number below.   Increase Ozempic to 1 mg weekly  Will get imaging of abdomen.  They will call to schedule with appointment.    Referral sent to Dr Luis Abed for right arm pain [address] [place], [place] [address] Phone: [phone]   This is a list of the screening recommended for you and due dates:  Health Maintenance  Topic Date Due   Eye exam for diabetics  Never done   Pap with HPV screening  10/24/2019   Pneumococcal Vaccination (1 of 2 - PCV) 09/12/2024*   Flu Shot  01/07/2024   Hemoglobin A1C  02/16/2024   Yearly kidney health urinalysis for diabetes  04/20/2024   Yearly kidney function blood test for diabetes  05/02/2024   Complete foot exam   05/23/2024   DTaP/Tdap/Td vaccine (2 - Td or Tdap) 12/01/2028   Hepatitis C Screening  Completed   HIV Screening  Completed   HPV Vaccine  Aged Out   COVID-19 Vaccine  Discontinued  *Topic was postponed. The date shown is not the original due date.     If you have any questions or concerns, please do not hesitate to call the office at [phone].  I look forward to our next visit and until then take care and stay safe.  Regards,   Dana Allan, MD   [facility]

## 2023-09-13 NOTE — Telephone Encounter (Signed)
-----   Message from Dana Allan sent at 09/13/2023  1:08 PM EDT ----- Blood work acceptable

## 2023-09-15 ENCOUNTER — Telehealth: Payer: Self-pay | Admitting: Family Medicine

## 2023-09-15 NOTE — Telephone Encounter (Signed)
 Lft pt vm to call ofc to sch CT. thanks

## 2023-09-22 ENCOUNTER — Ambulatory Visit
Admission: RE | Admit: 2023-09-22 | Discharge: 2023-09-22 | Disposition: A | Discharge status: Home / Self Care | Source: Ambulatory Visit | Attending: Family Medicine | Admitting: Family Medicine

## 2023-09-22 DIAGNOSIS — R109 Unspecified abdominal pain: Secondary | ICD-10-CM | POA: Diagnosis not present

## 2023-09-22 DIAGNOSIS — R1010 Upper abdominal pain, unspecified: Secondary | ICD-10-CM | POA: Insufficient documentation

## 2023-09-22 DIAGNOSIS — N200 Calculus of kidney: Secondary | ICD-10-CM | POA: Diagnosis not present

## 2023-09-26 ENCOUNTER — Encounter: Payer: Self-pay | Admitting: Family Medicine

## 2023-09-26 DIAGNOSIS — M79601 Pain in right arm: Secondary | ICD-10-CM | POA: Insufficient documentation

## 2023-09-26 DIAGNOSIS — R1010 Upper abdominal pain, unspecified: Secondary | ICD-10-CM | POA: Insufficient documentation

## 2023-09-26 NOTE — Assessment & Plan Note (Signed)
 Suboptimal glycemic control with morning hyperglycemia. Current A1c is 7.2%. - Increase medication dosage to 1 mg. - Monitor blood glucose levels regularly. - Reassess A1c in 2-3 months. - Educate on dietary modifications to manage blood glucose levels.

## 2023-09-26 NOTE — Assessment & Plan Note (Deleted)
 Chronic arm pain post-flu shot in 2012, described as a snapping sensation with occasional radiation to the head.  - Referral to sports medicine specialist

## 2023-09-26 NOTE — Assessment & Plan Note (Signed)
 Chronic arm pain post-flu shot in 2012, described as a snapping sensation with occasional radiation to the head.  - Referral to sports medicine specialist

## 2023-09-26 NOTE — Assessment & Plan Note (Signed)
 Intermittent upper abdominal pain with nausea, similar to previous hernia symptoms. Differential includes hernia recurrence or other abdominal pathology. - Order CT scan of the abdomen to evaluate for hernia recurrence or other pathology. - Consider referral to a specialist if CT scan indicates hernia recurrence.

## 2023-09-27 ENCOUNTER — Encounter: Payer: Self-pay | Admitting: Family Medicine

## 2023-09-27 ENCOUNTER — Telehealth: Payer: Self-pay

## 2023-09-27 NOTE — Telephone Encounter (Signed)
 Copied from CRM [id number]. Topic: Clinical - Lab/Test Results >> Sep 27, 2023 12:36 PM Crispin Dolphin wrote: Reason for CRM: Patient returned missed call for results for CT. Had further questions if someone could give her a call back. Thank you

## 2023-09-27 NOTE — Telephone Encounter (Signed)
 Spoke with patient and made her aware of recent imaging results. Patient has another encounter under result management. Patient had questions about her recent imaging results.

## 2023-10-08 DIAGNOSIS — M542 Cervicalgia: Secondary | ICD-10-CM | POA: Diagnosis not present

## 2023-10-08 DIAGNOSIS — M25511 Pain in right shoulder: Secondary | ICD-10-CM | POA: Diagnosis not present

## 2023-10-28 ENCOUNTER — Encounter: Payer: Self-pay | Admitting: Family Medicine

## 2023-10-29 ENCOUNTER — Emergency Department
Admission: EM | Admit: 2023-10-29 | Discharge: 2023-10-29 | Disposition: A | Discharge status: Home / Self Care | Attending: Emergency Medicine | Admitting: Emergency Medicine

## 2023-10-29 ENCOUNTER — Other Ambulatory Visit: Payer: Self-pay

## 2023-10-29 ENCOUNTER — Telehealth: Payer: Self-pay

## 2023-10-29 DIAGNOSIS — I10 Essential (primary) hypertension: Secondary | ICD-10-CM | POA: Insufficient documentation

## 2023-10-29 DIAGNOSIS — E162 Hypoglycemia, unspecified: Secondary | ICD-10-CM

## 2023-10-29 DIAGNOSIS — E11649 Type 2 diabetes mellitus with hypoglycemia without coma: Secondary | ICD-10-CM | POA: Diagnosis not present

## 2023-10-29 DIAGNOSIS — Z7984 Long term (current) use of oral hypoglycemic drugs: Secondary | ICD-10-CM | POA: Diagnosis not present

## 2023-10-29 LAB — HEMOGLOBIN A1C
Hgb A1c MFr Bld: 6.3 % — ABNORMAL HIGH (ref 4.8–5.6)
Mean Plasma Glucose: 134.11 mg/dL

## 2023-10-29 LAB — CBC
HCT: 42.5 % (ref 36.0–46.0)
Hemoglobin: 14.2 g/dL (ref 12.0–15.0)
MCH: 30.3 pg (ref 26.0–34.0)
MCHC: 33.4 g/dL (ref 30.0–36.0)
MCV: 90.8 fL (ref 80.0–100.0)
Platelets: 240 10*3/uL (ref 150–400)
RBC: 4.68 MIL/uL (ref 3.87–5.11)
RDW: 12.4 % (ref 11.5–15.5)
WBC: 10.2 10*3/uL (ref 4.0–10.5)
nRBC: 0 % (ref 0.0–0.2)

## 2023-10-29 LAB — BASIC METABOLIC PANEL WITH GFR
Anion gap: 13 (ref 5–15)
BUN: 13 mg/dL (ref 6–20)
CO2: 23 mmol/L (ref 22–32)
Calcium: 9.2 mg/dL (ref 8.9–10.3)
Chloride: 101 mmol/L (ref 98–111)
Creatinine, Ser: 0.54 mg/dL (ref 0.44–1.00)
GFR, Estimated: >60 mL/min (ref >60)
Glucose, Bld: 144 mg/dL — ABNORMAL HIGH (ref 70–99)
Potassium: 3.6 mmol/L (ref 3.5–5.1)
Sodium: 137 mmol/L (ref 135–145)

## 2023-10-29 LAB — URINALYSIS, ROUTINE W REFLEX MICROSCOPIC
Bilirubin Urine: NEGATIVE
Glucose, UA: NEGATIVE mg/dL
Ketones, ur: NEGATIVE mg/dL
Leukocytes,Ua: NEGATIVE
Nitrite: NEGATIVE
Protein, ur: NEGATIVE mg/dL
Specific Gravity, Urine: 1.028 (ref 1.005–1.030)
pH: 5 (ref 5.0–8.0)

## 2023-10-29 LAB — CBG MONITORING, ED: Glucose-Capillary: 127 mg/dL — ABNORMAL HIGH (ref 70–99)

## 2023-10-29 LAB — HEPATIC FUNCTION PANEL
ALT: 34 U/L (ref 0–44)
AST: 22 U/L (ref 15–41)
Albumin: 4.4 g/dL (ref 3.5–5.0)
Alkaline Phosphatase: 71 U/L (ref 38–126)
Bilirubin, Direct: 0.1 mg/dL (ref 0.0–0.2)
Indirect Bilirubin: 1 mg/dL — ABNORMAL HIGH (ref 0.3–0.9)
Total Bilirubin: 1.1 mg/dL (ref 0.0–1.2)
Total Protein: 7.6 g/dL (ref 6.5–8.1)

## 2023-10-29 LAB — LIPASE, BLOOD: Lipase: 37 U/L (ref 11–51)

## 2023-10-29 NOTE — ED Provider Notes (Signed)
 [facility] Emergency Department Provider Note     Event Date/Time   First MD Initiated Contact with Patient 10/29/23 [phone]     (approximate)   History   Hypoglycemia   HPI  Amber Foster is a 43 y.o. female with a history of PCOS, diabetes, HLD, HTN, PCOS, presents to the ED endorsing low blood sugar readings.  Patient reports that her blood sugars have been low during the day; on 2 separate occasions in the last 2 days.  She is currently on Ozempic  for almost a year for control of her symptoms.  She was recently increased from 0.5 ml to 1 ml vials about 2 months ago. She has an episode yesterday while cleaning house where she experienced sudden cold chills, dizziness, and noted a blood sugar reading of 45.  She was able to eat some chocolate and quickly resolved her sugars.  She continued to feel fatigued during the day yesterday.  Today she noted a separate episode where she began to feel slightly shaky, and this time noted a CBG reading of 80.  She was able to report the hypoglycemia by eating something and symptoms resolved.  She presents to the ED at the advice of her primary provider for evaluation.  Patient denies any current symptoms hypoglycemia, but does endorse generalized fatigue.  She denies any syncope, fevers, chills, sweats, chest pain currently.  She denies any recent illness, nausea, vomiting, bowel changes.  She would report that yesterday she was experiencing some lower pelvic pain related to her typical PCOS exacerbation.  That discomfort and pain may have preceded her hypoglycemia as she admits to poor p.o. intake.  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  Physical Exam   Triage Vital Signs: ED Triage Vitals [10/29/23 1722]  Encounter Vitals Group     BP (!) 143/82     Systolic BP Percentile      Diastolic BP Percentile       Pulse Rate 65     Resp 17     Temp 98 F (36.7 C)     Temp Source Oral     SpO2 98 %     Weight 160 lb (72.6 kg)     Height      Head Circumference      Peak Flow      Pain Score 0     Pain Loc      Pain Education      Exclude from Growth Chart     Most recent vital signs: Vitals:   10/29/23 1722 10/29/23 2104  BP: (!) 143/82 111/64  Pulse: 65 63  Resp: 17 18  Temp: 98 F (36.7 C) 98.2 F (36.8 C)  SpO2: 98% 96%    General Awake, no distress. NAD HEENT NCAT. PERRL. EOMI. No rhinorrhea. Mucous membranes are moist.  CV:  Good peripheral perfusion. RRR RESP:  Normal effort. CTA ABD:  No distention.   ED Results / Procedures / Treatments   Labs (all labs ordered are listed, but only abnormal results are displayed) Labs Reviewed  BASIC METABOLIC PANEL WITH GFR - Abnormal; Notable for the following components:      Result Value   Glucose, Bld 144 (*)    All other components within normal limits  URINALYSIS, ROUTINE W REFLEX MICROSCOPIC - Abnormal; Notable for the following components:   Color, Urine YELLOW (*)  APPearance CLOUDY (*)    Hgb urine dipstick SMALL (*)    Bacteria, UA RARE (*)    All other components within normal limits  HEPATIC FUNCTION PANEL - Abnormal; Notable for the following components:   Indirect Bilirubin 1.0 (*)    All other components within normal limits  HEMOGLOBIN A1C - Abnormal; Notable for the following components:   Hgb A1c MFr Bld 6.3 (*)    All other components within normal limits  CBG MONITORING, ED - Abnormal; Notable for the following components:   Glucose-Capillary 127 (*)    All other components within normal limits  CBC  LIPASE, BLOOD    EKG   RADIOLOGY   No results found.   PROCEDURES:  Critical Care performed: No  Procedures   MEDICATIONS ORDERED IN ED: Medications - No data to display   IMPRESSION / MDM / ASSESSMENT AND PLAN / ED COURSE  I reviewed the triage vital signs and the nursing notes.                               Differential diagnosis includes, but is not limited to, hypoglycemia, infection, UTI, electrolyte abnormality, dehydration  Patient's presentation is most consistent with acute presentation with potential threat to life or bodily function.  Patient's diagnosis is consistent with hypoglycemia.  Patient presents in no acute distress to the ED accompanied by family, for evaluation of 2 separate episodes of hypoglycemia.  Patient with current semaglutide  treatment for her diabetes with weekly injections denies any recent illness or infection.  She would endorse some abdominal pelvic discomfort yesterday related to PCOS.  Patient will be discharged home with to eat regular meals to avoid low blood sugar drops, and to have carb rich foods or glucose tabs to treat acute hyperglycemia. Patient is to follow up with PCP as discussed, as needed or otherwise directed. Patient is given ED precautions to return to the ED for any worsening or new symptoms.   FINAL CLINICAL IMPRESSION(S) / ED DIAGNOSES   Final diagnoses:  Hypoglycemia     Rx / DC Orders   ED Discharge Orders     None        Note:  This document was prepared using Dragon voice recognition software and may include unintentional dictation errors.    May Sparks, PA-C 10/29/23 2305    Bryson Carbine, MD 10/29/23 2308

## 2023-10-29 NOTE — ED Notes (Signed)
 Pt presented to ED with c/o hypoglycemia. States on ozempic  for diabetes. States yesterday BGL in the 40s and today BGL dropped to 80s. Sent to ED by PCP for evaluation of hypoglycemia. Denies changes in medications.

## 2023-10-29 NOTE — Telephone Encounter (Signed)
 Dr. Sueanne Emerald advised pt to go to the ER for evaluation. I left a message for pt to return call to the office.

## 2023-10-29 NOTE — Discharge Instructions (Addendum)
 Your exam and labs overall reassuring at this time.  You are experiencing some intermittent hypoglycemia, likely due to your diagnosis of diabetes and your current treatment.  You should manage your blood sugars by eating regularly every 2-3 hours.  Be sure to have 15 g of quick digesting carbs available if you should experience low blood sugar.  Consider checking your blood sugar regularly.  Follow-up with your primary provider for ongoing evaluation and management of your diabetes.  Return to the ED if needed.

## 2023-10-29 NOTE — Telephone Encounter (Signed)
 Spoke to Amber Foster and advised her because she is having symptoms when her sugar drop to go to UC or ED to be evaluated. She stated that she ate a Hershey kiss and her sugar came back up.

## 2023-10-29 NOTE — ED Triage Notes (Signed)
 Pt sts that she is diabetic and that her BGL has been going low during the day. Pt sts that she is on Ozempic  for the control of her diabetes, however she has never experienced the low.

## 2023-11-05 ENCOUNTER — Encounter: Payer: Self-pay | Admitting: Family Medicine

## 2023-11-08 NOTE — Telephone Encounter (Unsigned)
 Copied from CRM [id number]. Topic: Appointments - Scheduling Inquiry for Clinic >> Nov 08, 2023  1:19 PM Amber Foster wrote: Reason for CRM: Patient called in stating that she received a mychart message that she needs to be seen but she has an appointment on the 16th, wanted to know if she needs to be seen before then or keep the 16th appointment with Dr.Walsh, would like a callback regarding this

## 2023-11-18 ENCOUNTER — Other Ambulatory Visit: Payer: Self-pay | Admitting: Family Medicine

## 2023-11-18 DIAGNOSIS — E118 Type 2 diabetes mellitus with unspecified complications: Secondary | ICD-10-CM

## 2023-11-22 ENCOUNTER — Ambulatory Visit: Payer: BC Managed Care – PPO | Admitting: Family Medicine

## 2023-11-22 ENCOUNTER — Telehealth: Payer: Self-pay | Admitting: Family Medicine

## 2023-11-22 ENCOUNTER — Encounter: Payer: Self-pay | Admitting: Family Medicine

## 2023-11-22 VITALS — BP 128/88 | HR 76 | Temp 98.4°F | Resp 20 | Ht 64.0 in | Wt 164.5 lb

## 2023-11-22 DIAGNOSIS — E1159 Type 2 diabetes mellitus with other circulatory complications: Secondary | ICD-10-CM | POA: Diagnosis not present

## 2023-11-22 DIAGNOSIS — E282 Polycystic ovarian syndrome: Secondary | ICD-10-CM | POA: Diagnosis not present

## 2023-11-22 DIAGNOSIS — E1169 Type 2 diabetes mellitus with other specified complication: Secondary | ICD-10-CM | POA: Diagnosis not present

## 2023-11-22 DIAGNOSIS — E11649 Type 2 diabetes mellitus with hypoglycemia without coma: Secondary | ICD-10-CM

## 2023-11-22 DIAGNOSIS — E785 Hyperlipidemia, unspecified: Secondary | ICD-10-CM

## 2023-11-22 DIAGNOSIS — Z7985 Long-term (current) use of injectable non-insulin antidiabetic drugs: Secondary | ICD-10-CM

## 2023-11-22 DIAGNOSIS — E118 Type 2 diabetes mellitus with unspecified complications: Secondary | ICD-10-CM | POA: Diagnosis not present

## 2023-11-22 DIAGNOSIS — I152 Hypertension secondary to endocrine disorders: Secondary | ICD-10-CM

## 2023-11-22 DIAGNOSIS — Z30011 Encounter for initial prescription of contraceptive pills: Secondary | ICD-10-CM

## 2023-11-22 MED ORDER — ROSUVASTATIN CALCIUM 10 MG PO TABS: 10.0000 mg | ORAL_TABLET | Freq: Every evening | ORAL | 3 refills | Status: DC

## 2023-11-22 MED ORDER — FREESTYLE LIBRE 3 PLUS SENSOR MISC: 11 refills | Status: DC

## 2023-11-22 MED ORDER — OZEMPIC (1 MG/DOSE) 4 MG/3ML ~~LOC~~ SOPN
0.5000 mg | PEN_INJECTOR | SUBCUTANEOUS | 1 refills | Status: DC
Start: 2023-11-22 — End: 2024-03-01

## 2023-11-22 MED ORDER — LOSARTAN POTASSIUM 25 MG PO TABS: 25.0000 mg | ORAL_TABLET | Freq: Every evening | ORAL | 3 refills | Status: DC

## 2023-11-22 MED ORDER — NORETHINDRONE 0.35 MG PO TABS: 1.0000 | ORAL_TABLET | Freq: Every day | ORAL | 11 refills | Status: DC

## 2023-11-22 NOTE — Progress Notes (Unsigned)
 SUBJECTIVE:   Chief Complaint  Patient presents with   Medical Management of Chronic Issues   HPI Presents for follow up chronic disease management  Discussed the use of AI scribe software for clinical note transcription with the patient, who gave verbal consent to proceed.  History of Present Illness Amber Foster is a 43 year old female with diabetes and hypertension who presents for follow-up of her blood sugar levels and blood pressure management.  Her blood pressure readings at home have been normal, around 111 mmHg, and her current reading is 120/80 mmHg. No symptoms related to her blood pressure are present.  She describes recent episodes of hypoglycemia, with blood sugar levels dropping into the 40s. The first episode occurred while cleaning her house, leading to symptoms of extreme thirst, near syncope, and a blood sugar reading of 45 mg/dL. The second episode happened a week later at a grocery store, where she felt unwell and recorded a blood sugar level of 47 mg/dL. She experiences symptoms such as cold sweats, shaking, and stuttering during these episodes. Her A1c has decreased from 7.3% to 6.3%. Blood sugar levels tend to normalize after eating.  She eats daily but sometimes skips meals or consumes sugary foods. She tries to include protein in her diet but finds it difficult to eat in the morning due to nausea. She prefers boiled egg whites and sausage over other forms of eggs. She snacks on vegetables and grilled chicken at work but notes that her blood sugar can be high the next morning if she doesn't eat a full meal.  She uses a glucometer provided by her workplace, which automatically logs her readings into an app monitored by coaches. She has not seen a diabetic nutritionist or educator recently.  Her current medications include Ozempic , Micronor , Cozaar , and Crestor , and she occasionally uses Aleve. She avoids Tylenol  due to concerns about her liver.  She drinks bottled  water  regularly and sometimes lemon water , consuming about three to five 16-ounce bottles at work. She has a history of kidney stones and is cautious about her calcium  intake.    PERTINENT PMH / PSH: As above  OBJECTIVE:  BP 128/88   Pulse 76   Temp 98.4 F (36.9 C)   Resp 20   Ht 5' 4 (1.626 m)   Wt 164 lb 8 oz (74.6 kg)   LMP 11/15/2023   SpO2 99%   BMI 28.24 kg/m    Physical Exam Vitals reviewed.  Constitutional:      General: She is not in acute distress.    Appearance: Normal appearance. She is not ill-appearing, toxic-appearing or diaphoretic.   Eyes:     General:        Right eye: No discharge.        Left eye: No discharge.     Conjunctiva/sclera: Conjunctivae normal.    Cardiovascular:     Rate and Rhythm: Normal rate and regular rhythm.     Heart sounds: Normal heart sounds.  Pulmonary:     Effort: Pulmonary effort is normal.     Breath sounds: Normal breath sounds.  Abdominal:     General: Bowel sounds are normal.   Musculoskeletal:        General: Normal range of motion.   Skin:    General: Skin is warm and dry.   Neurological:     General: No focal deficit present.     Mental Status: She is alert and oriented to person, place, and time. Mental  status is at baseline.   Psychiatric:        Mood and Affect: Mood normal.        Behavior: Behavior normal.        Thought Content: Thought content normal.        Judgment: Judgment normal.           11/22/2023   11:32 AM 09/13/2023    8:50 AM 08/16/2023   11:08 AM 05/24/2023   11:25 AM 04/26/2023    4:02 PM  Depression screen PHQ 2/9  Decreased Interest 0 0 0 0 0  Down, Depressed, Hopeless 0 0 0 0 0  PHQ - 2 Score 0 0 0 0 0  Altered sleeping 0 0 1 0 1  Tired, decreased energy 3 0 3 1 1   Change in appetite 0 0 0 0 1  Feeling bad or failure about yourself  0 0 0 0 0  Trouble concentrating 0 0 0 0 1  Moving slowly or fidgety/restless 0 0 0 0 0  Suicidal thoughts 0 0 0 0 0  PHQ-9 Score 3 0 4  1 4   Difficult doing work/chores Somewhat difficult Not difficult at all Somewhat difficult Not difficult at all Not difficult at all      11/22/2023   11:32 AM 08/16/2023   11:08 AM 05/24/2023   11:25 AM 04/26/2023    4:03 PM  GAD 7 : Generalized Anxiety Score  Nervous, Anxious, on Edge 0 0 0 2  Control/stop worrying 0 0 0 1  Worry too much - different things 0 0 0 0  Trouble relaxing 0 0 0 0  Restless 0 1 0 0  Easily annoyed or irritable 0 1 0 1  Afraid - awful might happen 0 0 0 0  Total GAD 7 Score 0 2 0 4  Anxiety Difficulty Not difficult at all Not difficult at all Not difficult at all Not difficult at all    ASSESSMENT/PLAN:  Type 2 diabetes mellitus with complications (HCC) Assessment & Plan: Recurrent hypoglycemia with glucose levels in the 40s. Symptoms include lightheadedness, cold sweats, tremors, and stuttering. A1c decreased from 7.3 to 6.3.  Ozempic  may contribute, especially with insufficient dietary intake. - Decrease Ozempic  to 0.5 mg weekly - Refer to diabetic nutritionist for dietary guidance. - Arrange for pharmacist to contact her next week for potential medication adjustments. - Provide a sample of Freestyle Libre for continuous glucose monitoring and assist with setup. - Ensure she has glucose sources readily available for hypoglycemic episodes. - Encourage consumption of at least three meals a day with a focus on protein intake.  Orders: -     AMB Referral [facility] Care Management -     Ozempic  (1 MG/DOSE); Inject 0.5 mg into the skin once a week.  Dispense: 2 mL; Refill: 1 -     FreeStyle Libre 3 Plus Sensor; Change sensor every 15 days.  Dispense: 2 each; Refill: 11  Hypertension associated with diabetes (HCC) Assessment & Plan: Well controlled  -Refill Losartan  25 mg daily  Orders: -     Losartan  Potassium; Take 1 tablet (25 mg total) by mouth at bedtime.  Dispense: 90 tablet; Refill: 3  Hyperlipidemia associated with type 2 diabetes mellitus  (HCC) Assessment & Plan: Tolerating statin -Refill Rosuvastatin  10mg .  Orders: -     Rosuvastatin  Calcium ; Take 1 tablet (10 mg total) by mouth every evening.  Dispense: 90 tablet; Refill: 3  PCOS (polycystic ovarian syndrome) Assessment & Plan: Refill  Micronor    Orders: -     Norethindrone ; Take 1 tablet (0.35 mg total) by mouth daily.  Dispense: 28 tablet; Refill: 11   PDMP reviewed  Return in about 3 months (around 02/22/2024) for PCP.  Valli Gaw, MD

## 2023-11-22 NOTE — Patient Instructions (Signed)
 It was a pleasure meeting you today. Thank you for allowing me to take part in your health care.  Our goals for today as we discussed include:  Decrease Ozempic  to 0.5 mg weekly  Referral sent to Pharmacy to help with diabetes and medication adjustment  Try Freestyle Libre 3 plus.  Continuous glucose monitoring.  Sample provided  Three meals a day with 30-60 gm protein   This is a list of the screening recommended for you and due dates:  Health Maintenance  Topic Date Due   Eye exam for diabetics  Never done   HPV Vaccine (1 - 3-dose series) Never done   Pap with HPV screening  10/24/2019   Pneumococcal Vaccination (1 of 2 - PCV) 09/12/2024*   Flu Shot  01/07/2024   Yearly kidney health urinalysis for diabetes  04/20/2024   Hemoglobin A1C  04/30/2024   Complete foot exam   05/23/2024   Yearly kidney function blood test for diabetes  10/28/2024   DTaP/Tdap/Td vaccine (2 - Td or Tdap) 12/01/2028   Hepatitis C Screening  Completed   HIV Screening  Completed   Meningitis B Vaccine  Aged Out   COVID-19 Vaccine  Discontinued  *Topic was postponed. The date shown is not the original due date.    If you have any questions or concerns, please do not hesitate to call the office at [phone].  I look forward to our next visit and until then take care and stay safe.  Regards,   Valli Gaw, MD   [facility]

## 2023-11-23 ENCOUNTER — Telehealth: Payer: Self-pay | Admitting: *Deleted

## 2023-11-23 NOTE — Progress Notes (Signed)
 Care Guide Pharmacy Note  11/23/2023 Name: [name] MRN: [id number] DOB: September 01, 1980  Referred By: Valli Gaw, MD Reason for referral: Complex Care Management (Outreach to schedule referral with pharmacist )   Amber Foster is a 43 y.o. year old female who is a primary care patient of Valli Gaw, MD.  Verona Hartshorn was referred to the pharmacist for assistance related to: DMII  Successful contact was made with the patient to discuss pharmacy services including being ready for the pharmacist to call at least 5 minutes before the scheduled appointment time and to have medication bottles and any blood pressure readings ready for review. The patient agreed to meet with the pharmacist via telephone visit on 11/29/2023  Kandis Ormond, CMA Nadine  [facility], [facility] Guide Direct Dial: [phone]  Fax: [phone] Website: [email or web address]

## 2023-11-25 ENCOUNTER — Encounter: Payer: Self-pay | Admitting: Family Medicine

## 2023-11-25 NOTE — Assessment & Plan Note (Signed)
 Well-controlled. Refill Losartan 25 mg daily.

## 2023-11-25 NOTE — Assessment & Plan Note (Signed)
 Tolerating statin -Refill Rosuvastatin  10mg .

## 2023-11-25 NOTE — Assessment & Plan Note (Addendum)
 Recurrent hypoglycemia with glucose levels in the 40s. Symptoms include lightheadedness, cold sweats, tremors, and stuttering. A1c decreased from 7.3 to 6.3.  Ozempic  may contribute, especially with insufficient dietary intake. - Decrease Ozempic  to 0.5 mg weekly - Refer to diabetic nutritionist for dietary guidance. - Arrange for pharmacist to contact her next week for potential medication adjustments. - Provide a sample of Freestyle Libre for continuous glucose monitoring and assist with setup. - Ensure she has glucose sources readily available for hypoglycemic episodes. - Encourage consumption of at least three meals a day with a focus on protein intake.

## 2023-11-25 NOTE — Assessment & Plan Note (Signed)
Refill Micronor.

## 2023-11-29 ENCOUNTER — Other Ambulatory Visit (INDEPENDENT_AMBULATORY_CARE_PROVIDER_SITE_OTHER)

## 2023-11-29 DIAGNOSIS — E118 Type 2 diabetes mellitus with unspecified complications: Secondary | ICD-10-CM

## 2023-11-29 NOTE — Progress Notes (Unsigned)
 12/02/2023 Name: Amber Foster MRN: [id number] DOB: 26-May-1981  Subjective  Chief Complaint  Patient presents with   Diabetes   Reason for visit: ?  Amber Foster is a 43 y.o. female with a history of diabetes (type 2), who presents today for an initial diabetes pharmacotherapy visit.? Pertinent PMH also includes HTN, hepatic steatosis, HLD.  Known DM Complications: no known complications   Care Team: Primary Care Provider: No primary care provider on file.   Date of Last Diabetes Related Visit: with PCP on 11/22/23  Recent Summary of Change: On ozempic  1 mg. Recently having hypoglycemic events.  Lows of 45. Recent A1c 6.3. ?? Ozempic  0.5 mg weekly. Needs Diabetic education regarding nutrition  Medication Access/Adherence: Prescription drug coverage: Payor: BLUE CROSS BLUE SHIELD / Plan: BCBS COMM PPO / Product Type: *No Product type* / .  - Reports that all medications are affordable at present time - Current Patient Assistance: None - Medication Adherence: Patient denies missing doses of their medication.    History of Present Illness:  Patient notes DM2 diagnosis in 2021 s/p gestational diabetes. Initially prescribed metformin  upon T2DM diagnosis which she took for a long time though with nausea/vomiting. Denies significant hx of hypoglycemia on metformin  monotherapy.   Most recently, switched to Ozempic  at the end of 2024.  Patent notes -10-12 lbs initially on Ozempic , now maintaining current weight. More recently, has struggled with hypoglycemia down to the 50s. Notes rapid onset, unpredictable.   Since Last visit: ?  Patient reports partially implementing plan from last visit. Has been wearing Libre sensor without issues. Reports she had just purchased a refill of Ozempic  1.0 mg pens. She has not picked up the lower strength 0.5 mg pens yet at this time. Usual injection day = Monday evenings.    Reported DM Regimen: ?  Ozempic  1.0 mg weekly (Mondays) - w plan to decrease today  per PCP instruction    DM medications tried in the past:?  Ozempic  1.0 mg weekly (reduced 2/2 hypoglycemia) Metformin  (notes some weight loss on metformin , significant nausea/vomiting) Insulin (during pregnancy)   SMBG: Glucometer. Libre 3 sample obtained 6/16. Has not picked up refill at pharmacy yet. Is not sure if covered. $74 copay per her [facility].    CGM Report (Date Range 6/10 - 6/23) ABG 125 mg/dL; GMI 3.6%; Variability 25.7%; TIR 92%, high 8%, very high 0%, low 0%, very low 0%     Hypo/Hyperglycemia: ?  Symptoms of hypoglycemia since last visit:? yes though non Patient-reported symptoms of hypoglycemia: lightheadedness, cold sweats, tremors, and stuttering  Reported Diet: Patient typically eats 2 meals per day. Works 4pm - 12am making meal schedule challenging.  She tries to include protein in her diet but finds it difficult to eat in the morning due to nausea. Uncertain if related to Ozempic .  Cautious of calcium  intake per hx severe renal stones  Breakfast: Skips (eating in the morning results in nausea - especially pork, eggs) Lunch/Dinner: Lunch at work. Sometimes gets busy and eats late. Evening meal variable given her work shift (sometimes dinner ends up being 2am or so).  Beverages: Bottled water /lemon water . Three to five 16-ounce bottles at work.   DM Prevention:  Statin: Taking?  History of chronic kidney disease? no History of albuminuria? yes, last UACR on 04/21/23 = 120 mg/g ACE/ARB - Taking losartan ; Urine MA/CR Ratio - elevated urinary albumin excretion.  Last eye exam: Not documented; No documented hx retinopathy Last foot exam: 05/24/2023 Tobacco Use: Former smoker  Cardiovascular Risk Reduction History of clinical ASCVD? no The 10-year ASCVD risk score (Arnett DK, et al., 2019) is: 3.4% History of heart failure? no  History of hyperlipidemia? yes Current BMI: 28.2 kg/m2 (Ht 64 in, Wt 74.6 kg) Taking statin? yes; moderate intensity (rosuvastatin   10mg ) Taking aspirin? not indicated; Not taking   Taking SGLT-2i? no Taking GLP- 1 RA? yes      _______________________________________________  Objective    Vitals:  Wt Readings from Last 3 Encounters:  11/22/23 164 lb 8 oz (74.6 kg)  10/29/23 160 lb (72.6 kg)  09/13/23 167 lb (75.8 kg)   BP Readings from Last 3 Encounters:  11/22/23 128/88  10/29/23 111/64  09/13/23 126/86   Pulse Readings from Last 3 Encounters:  11/22/23 76  10/29/23 63  09/13/23 83     Labs:?  Lab Results  Component Value Date   HGBA1C 6.3 (H) 10/29/2023   HGBA1C 7.2 (A) 08/16/2023   HGBA1C 7.3 (H) 04/21/2023   GLUCOSE 144 (H) 10/29/2023   MICRALBCREAT 12.0 04/21/2023   CREATININE 0.54 10/29/2023   CREATININE 0.58 09/13/2023   CREATININE 0.68 05/03/2023   GFR 112.03 09/13/2023   GFR 108.09 05/03/2023   GFR 112.81 04/21/2023    Lab Results  Component Value Date   CHOL 185 04/21/2023   LDLCALC 95 04/21/2023   LDLCALC 113 (H) 08/18/2021   LDLDIRECT 70.0 03/06/2020   HDL 34.30 (L) 04/21/2023   TRIG 278.0 (H) 04/21/2023   TRIG 181.0 (H) 08/18/2021   TRIG 363.0 (H) 03/06/2020   ALT 34 10/29/2023   ALT 21 09/13/2023   AST 22 10/29/2023   AST 16 09/13/2023      Chemistry      Component Value Date/Time   NA 137 10/29/2023 1724   NA 136 09/04/2014 0658   K 3.6 10/29/2023 1724   K 3.9 09/04/2014 0658   CL 101 10/29/2023 1724   CL 104 09/04/2014 0658   CO2 23 10/29/2023 1724   CO2 22 09/04/2014 0658   BUN 13 10/29/2023 1724   BUN 17 09/04/2014 0658   CREATININE 0.54 10/29/2023 1724   CREATININE 0.95 09/04/2014 0658      Component Value Date/Time   CALCIUM  9.2 10/29/2023 1724   CALCIUM  9.1 09/04/2014 0658   ALKPHOS 71 10/29/2023 1724   ALKPHOS 90 09/04/2014 0658   AST 22 10/29/2023 1724   AST 32 09/04/2014 0658   ALT 34 10/29/2023 1724   ALT 34 09/04/2014 0658   BILITOT 1.1 10/29/2023 1724   BILITOT 0.8 09/04/2014 0658       The 10-year ASCVD risk score (Arnett DK, et  al., 2019) is: 3.4%  Assessment and Plan:   1. Diabetes, type 2: well-controlled per last A1c of 6.3% (10/29/23). Hypoglycemia present despite absence of insulins/secretagogues. In part related to diet, though reports severity of low has been hard to predict. On rare occasion has noted very fast drop in sugar that catches her off-guard. Sees to occur when active (working, grocery shopping. Suspect related to prolonged fasting periods, and low-protein content of diet due to avoidance of foods for personal reasons (nausea, fear of renal stones).  Reduce Ozempic  to 0.5 mg as instructed by PCP On the 1.0 mg strength pen (blue box), 36 clicks = 0.5 mg.  Monitor weight with Ozempic  reduction (currently maintaining initial 10lb weight loss) Reviewed s/sx/tx hypoglycemia Protein consideration (low-calcium /non-dairy) Plant-based: Oats/oatmeal (easy on the stomach), Edamame, nuts, beans/legumes, lentils Protein powder: Mix with water  or other low-calcium  drink. Ex  Orgain protein (plant-based, not milk-based). Only 60mg  Ca/serving (4%DV)  Future Consideration: - consider SGLT2i in the setting of albuminuria as needed. GLP1 currently preferred for weight benefit with some success initially (~ -10lb).   Follow Up PharmD check-in 1 month  PCP follow up 2.5 months as scheduled Patient given direct line for questions/concerns  Future Appointments  Date Time Provider Department Center  02/16/2024 11:00 AM Bair, Luke, MD [facility]    Manuelita FABIENE Kobs, PharmD Clinical Pharmacist [facility] [phone]

## 2023-12-02 NOTE — Patient Instructions (Addendum)
 Ms. Kiante Petrovich,   It was a pleasure to speak with you today! As we discussed:?   Continue Ozempic  at the lower dose, 0.5 mg weekly On your 1.0 mg Ozempic  pen (Blue Box) - you can find the 0.5 mg dose by counting the little clicks. Starting at 0, twist and count to 36 clicks and mark a line in the dosing window. This is the 0.5 mg mark. Small bags of pen needles can usually be purchased at the pharmacy for low cost. Some pharmacies require a prescription, others do not.  We also discussed possible protein sources that are low in calcium : Plant-based Protein:  Oats/oatmeal suprisingly are a good source of protein (also very easy on the stomach, this may be a good breakfast option as oatmeal is typically safe for nausea). If cook time is a concern, you could try overnight oats in the fridge, or even baking the oatmeal while you get ready for work. Some people like to make muffins. There are countless very easy and quick ways to prepare plain oats.  Snacks: Microwavable Edamame, nuts, beans/legumes, lentils, low-sugar nut butters Protein powder:  There are plant-based powders (as opposed to buying the milk-based powders). You could mix in a smoothie, or mix with water  or other low-calcium  drink. Protein powder is also very good mixed into oatmeal!  Ex Orgain protein (plant-based pea protein). Only 60mg  Calcium  per serving which is very low (4% daily value)  Please reach out prior to your next scheduled appointment should you have any questions or concerns.  You may respond directly to this message, or leave me a voicemail at [phone] and I will get back to you shortly.    Thank you!   Future Appointments  Date Time Provider Department Center  02/16/2024 11:00 AM Bair, Luke, MD [facility]    Manuelita FABIENE Kobs, PharmD Clinical Pharmacist [facility] [phone]

## 2023-12-07 ENCOUNTER — Other Ambulatory Visit: Payer: Self-pay | Admitting: Family Medicine

## 2023-12-07 DIAGNOSIS — E118 Type 2 diabetes mellitus with unspecified complications: Secondary | ICD-10-CM

## 2023-12-14 ENCOUNTER — Telehealth: Payer: Self-pay | Admitting: Pharmacy Technician

## 2023-12-14 ENCOUNTER — Other Ambulatory Visit (HOSPITAL_COMMUNITY): Payer: Self-pay

## 2023-12-14 DIAGNOSIS — M542 Cervicalgia: Secondary | ICD-10-CM | POA: Diagnosis not present

## 2023-12-14 DIAGNOSIS — M25511 Pain in right shoulder: Secondary | ICD-10-CM | POA: Diagnosis not present

## 2023-12-14 NOTE — Telephone Encounter (Signed)
 Pharmacy Patient Advocate Encounter   Received notification from RX Request Messages that prior authorization for Freestyle Libre 3 Plus sensors is required/requested.   Insurance verification completed.   The patient is insured through [facility] .   Per test claim:  Dexcom G7 sensors is preferred by the insurance.  If suggested medication is appropriate, Please send in a new RX and discontinue this one. If not, please advise as to why it's not appropriate so that we may request a Prior Authorization. Please note, some preferred medications may still require a PA.  If the suggested medications have not been trialed and there are no contraindications to their use, the PA will not be submitted, as it will not be approved.  Ran test claim for Dexcom G7 pt copay is $76.21.

## 2023-12-14 NOTE — Telephone Encounter (Signed)
 PA request has been Received. New Encounter has been or will be created for follow up. For additional info see Pharmacy Prior Auth telephone encounter from 12/14/2023.

## 2023-12-20 ENCOUNTER — Other Ambulatory Visit: Payer: Self-pay

## 2023-12-20 DIAGNOSIS — E118 Type 2 diabetes mellitus with unspecified complications: Secondary | ICD-10-CM

## 2023-12-20 DIAGNOSIS — M25511 Pain in right shoulder: Secondary | ICD-10-CM | POA: Diagnosis not present

## 2023-12-20 MED ORDER — DEXCOM G7 RECEIVER DEVI: 0 refills | Status: DC

## 2023-12-20 MED ORDER — DEXCOM G7 SENSOR MISC: 6 refills | Status: DC

## 2023-12-20 NOTE — Telephone Encounter (Signed)
 Order and called pt to let them know that the G7 was sent to the pharmacy.

## 2023-12-29 DIAGNOSIS — M25511 Pain in right shoulder: Secondary | ICD-10-CM | POA: Diagnosis not present

## 2024-01-04 DIAGNOSIS — M25511 Pain in right shoulder: Secondary | ICD-10-CM | POA: Diagnosis not present

## 2024-02-16 ENCOUNTER — Telehealth: Payer: Self-pay

## 2024-02-16 ENCOUNTER — Encounter

## 2024-02-16 DIAGNOSIS — E282 Polycystic ovarian syndrome: Secondary | ICD-10-CM

## 2024-02-16 DIAGNOSIS — E118 Type 2 diabetes mellitus with unspecified complications: Secondary | ICD-10-CM

## 2024-02-16 DIAGNOSIS — E1169 Type 2 diabetes mellitus with other specified complication: Secondary | ICD-10-CM

## 2024-02-16 DIAGNOSIS — E1159 Type 2 diabetes mellitus with other circulatory complications: Secondary | ICD-10-CM

## 2024-02-16 DIAGNOSIS — I499 Cardiac arrhythmia, unspecified: Secondary | ICD-10-CM

## 2024-02-16 DIAGNOSIS — R1010 Upper abdominal pain, unspecified: Secondary | ICD-10-CM

## 2024-02-16 NOTE — Telephone Encounter (Signed)
 Lm to call office to reschedule appointment, provider out. Please call office and ask for [place] or [place]. MyChart message sent.

## 2024-03-01 ENCOUNTER — Ambulatory Visit (INDEPENDENT_AMBULATORY_CARE_PROVIDER_SITE_OTHER)

## 2024-03-01 VITALS — BP 122/82 | HR 69 | Temp 98.4°F | Ht 64.0 in | Wt 169.2 lb

## 2024-03-01 DIAGNOSIS — H8110 Benign paroxysmal vertigo, unspecified ear: Secondary | ICD-10-CM | POA: Diagnosis not present

## 2024-03-01 DIAGNOSIS — E118 Type 2 diabetes mellitus with unspecified complications: Secondary | ICD-10-CM | POA: Diagnosis not present

## 2024-03-01 DIAGNOSIS — D509 Iron deficiency anemia, unspecified: Secondary | ICD-10-CM

## 2024-03-01 DIAGNOSIS — E1159 Type 2 diabetes mellitus with other circulatory complications: Secondary | ICD-10-CM

## 2024-03-01 DIAGNOSIS — E282 Polycystic ovarian syndrome: Secondary | ICD-10-CM

## 2024-03-01 DIAGNOSIS — I152 Hypertension secondary to endocrine disorders: Secondary | ICD-10-CM | POA: Diagnosis not present

## 2024-03-01 DIAGNOSIS — E1169 Type 2 diabetes mellitus with other specified complication: Secondary | ICD-10-CM

## 2024-03-01 DIAGNOSIS — K219 Gastro-esophageal reflux disease without esophagitis: Secondary | ICD-10-CM | POA: Diagnosis not present

## 2024-03-01 DIAGNOSIS — E785 Hyperlipidemia, unspecified: Secondary | ICD-10-CM | POA: Diagnosis not present

## 2024-03-01 DIAGNOSIS — I499 Cardiac arrhythmia, unspecified: Secondary | ICD-10-CM

## 2024-03-01 DIAGNOSIS — Z7985 Long-term (current) use of injectable non-insulin antidiabetic drugs: Secondary | ICD-10-CM

## 2024-03-01 DIAGNOSIS — L409 Psoriasis, unspecified: Secondary | ICD-10-CM | POA: Insufficient documentation

## 2024-03-01 MED ORDER — KETOCONAZOLE 2 % EX SHAM
1.0000 | MEDICATED_SHAMPOO | CUTANEOUS | 2 refills | Status: AC
Start: 2024-03-02 — End: ?

## 2024-03-01 MED ORDER — PANTOPRAZOLE SODIUM 40 MG PO TBEC: 40.0000 mg | DELAYED_RELEASE_TABLET | Freq: Every day | ORAL | 0 refills | Status: DC

## 2024-03-01 MED ORDER — SEMAGLUTIDE(0.25 OR 0.5MG/DOS) 2 MG/3ML ~~LOC~~ SOPN: 0.2500 mg | PEN_INJECTOR | SUBCUTANEOUS | 2 refills | Status: DC

## 2024-03-01 NOTE — Patient Instructions (Signed)
 Start Protonix  40 mg in empty stomach in morning for 2 weeks. You can take this as needed after 2 weeks.

## 2024-03-01 NOTE — Assessment & Plan Note (Signed)
 BP stable, continue Losartan  25 mg daily, check CMP today.

## 2024-03-01 NOTE — Progress Notes (Addendum)
 Established Patient Office Visit TOC from Dr. Hope    Subjective  Patient ID: Amber Foster, female    DOB: [birth year]  Age: 43 y.o. MRN: [id number]  Chief Complaint  Patient presents with   Establish Care   GI Problem   Nausea   Emesis   Medication Management    She  has a past medical history of Allergy, Back pain, Bulging lumbar disc, Congenital anomaly of spinal meninges (HCC), COVID (03/2019), Diabetes mellitus without complication (HCC), Gestational diabetes, Headache(784.0), History of kidney stones, Hypertension, Polycystic ovarian syndrome (10/2014), UTI (lower urinary tract infection), and Yeast infection.  HPI Discussed the use of AI scribe software for clinical note transcription with the patient, who gave verbal consent to proceed.  History of Present Illness Amber Foster is a 43 year old female with PCOS and diabetes who presents for TOC from previous PCP.   - She has been experiencing nausea and vomiting for several months, with worsening symptoms over the past month. Episodes of dizziness and lightheadedness occur, sometimes lasting up to two hours, and are occasionally relieved by vomiting. She describes cramps and pains, particularly on the left side, radiating to the groin, similar to labor pains. Despite these symptoms, her blood sugar and blood pressure readings remain normal.  - She was diagnosed with polycystic ovary syndrome (PCOS) ten years ago following irregular and heavy menstrual cycles post-pregnancy. A mass in her uterus was identified as a large blood clot, and her ovaries and uterus were swollen with cysts. She has been on birth control for about a year to manage symptoms, taking one tablet daily at night. Her recent menstrual cycle was light but followed by a heavy period requiring ultra tampons.  - She has diabetes and monitors her blood sugar daily using a Livongo device. She was previously on Ozempic , and has been off it for about a month. She reported  taking 1 mg Ozempic  when her blood sugar was high after steroid use. Her last A1c was 6.3%, improved from 8%. She also takes losartan  25 mg daily for blood pressure and meloxicam  for a torn rotator cuff, though she plans to discontinue meloxicam  due to lack of efficacy.  - She has a history of kidney stones, with a significant event two years ago requiring emergency surgery for an infected stone. She also has liver issues diagnosed a few years back. She experiences daily diarrhea and has noticed new onset of hiccups after eating or drinking over the past few months.  - She has a scalp condition resembling psoriasis, with flare-ups that never fully resolve. Although not formally diagnosed, she experiences significant discomfort during flare-ups.  ROS As per HPI    Objective:     BP 122/82 (BP Location: Right Arm, Patient Position: Sitting, Cuff Size: Normal)   Pulse 69   Temp 98.4 F (36.9 C) (Oral)   Ht 5' 4 (1.626 m)   Wt 169 lb 3.2 oz (76.7 kg)   SpO2 99%   BMI 29.04 kg/m      03/01/2024    1:16 PM 11/22/2023   11:32 AM 09/13/2023    8:50 AM  Depression screen PHQ 2/9  Decreased Interest 0 0 0  Down, Depressed, Hopeless 0 0 0  PHQ - 2 Score 0 0 0  Altered sleeping 1 0 0  Tired, decreased energy 2 3 0  Change in appetite 0 0 0  Feeling bad or failure about yourself  0 0 0  Trouble concentrating 0 0 0  Moving slowly or fidgety/restless 0 0 0  Suicidal thoughts 0 0 0  PHQ-9 Score 3 3 0  Difficult doing work/chores Not difficult at all Somewhat difficult Not difficult at all      03/01/2024    1:16 PM 11/22/2023   11:32 AM 08/16/2023   11:08 AM 05/24/2023   11:25 AM  GAD 7 : Generalized Anxiety Score  Nervous, Anxious, on Edge 0 0 0 0  Control/stop worrying 0 0 0 0  Worry too much - different things 0 0 0 0  Trouble relaxing 0 0 0 0  Restless 1 0 1 0  Easily annoyed or irritable 0 0 1 0  Afraid - awful might happen 0 0 0 0  Total GAD 7 Score 1 0 2 0  Anxiety Difficulty  Not difficult at all Not difficult at all Not difficult at all Not difficult at all      03/01/2024    1:16 PM 11/22/2023   11:32 AM 09/13/2023    8:50 AM  Depression screen PHQ 2/9  Decreased Interest 0 0 0  Down, Depressed, Hopeless 0 0 0  PHQ - 2 Score 0 0 0  Altered sleeping 1 0 0  Tired, decreased energy 2 3 0  Change in appetite 0 0 0  Feeling bad or failure about yourself  0 0 0  Trouble concentrating 0 0 0  Moving slowly or fidgety/restless 0 0 0  Suicidal thoughts 0 0 0  PHQ-9 Score 3 3 0  Difficult doing work/chores Not difficult at all Somewhat difficult Not difficult at all      03/01/2024    1:16 PM 11/22/2023   11:32 AM 08/16/2023   11:08 AM 05/24/2023   11:25 AM  GAD 7 : Generalized Anxiety Score  Nervous, Anxious, on Edge 0 0 0 0  Control/stop worrying 0 0 0 0  Worry too much - different things 0 0 0 0  Trouble relaxing 0 0 0 0  Restless 1 0 1 0  Easily annoyed or irritable 0 0 1 0  Afraid - awful might happen 0 0 0 0  Total GAD 7 Score 1 0 2 0  Anxiety Difficulty Not difficult at all Not difficult at all Not difficult at all Not difficult at all   SDOH Screenings   Food Insecurity: Patient Declined (11/22/2023)  Housing: Unknown (11/22/2023)  Transportation Needs: No Transportation Needs (11/22/2023)  Depression (PHQ2-9): Low Risk  (03/01/2024)  Financial Resource Strain: Patient Declined (11/22/2023)  Physical Activity: Insufficiently Active (11/22/2023)  Social Connections: Unknown (11/22/2023)  Stress: No Stress Concern Present (11/22/2023)  Tobacco Use: Medium Risk (03/01/2024)     Physical Exam Constitutional:      General: She is not in acute distress.    Appearance: Normal appearance.  HENT:     Head: Normocephalic and atraumatic.     Right Ear: Tympanic membrane normal.     Left Ear: Tympanic membrane normal.     Mouth/Throat:     Mouth: Mucous membranes are moist.  Eyes:     Comments: Nystagmus, horizontal on Dix-Hallpike   Cardiovascular:      Rate and Rhythm: Normal rate.     Heart sounds: No murmur heard. Pulmonary:     Effort: Pulmonary effort is normal. No respiratory distress.     Breath sounds: Normal breath sounds. No wheezing.  Abdominal:     General: Bowel sounds are normal.     Palpations: Abdomen is soft.  Tenderness: There is no guarding.  Musculoskeletal:     Cervical back: Neck supple.     Right lower leg: No edema.     Left lower leg: No edema.     Comments: Scalp psoriatic plaque like lesion   Lymphadenopathy:     Cervical: No cervical adenopathy.  Skin:    General: Skin is warm.  Neurological:     Mental Status: She is alert and oriented to person, place, and time.     Gait: Gait normal.  Psychiatric:        Mood and Affect: Mood normal.        Behavior: Behavior normal.        No results found for any visits on 03/01/24.  The 10-year ASCVD risk score (Arnett DK, et al., 2019) is: 3.1%     Assessment & Plan:   Gastroesophageal reflux disease, unspecified whether esophagitis present Assessment & Plan: Given recent use of Meloxicam , hiccups with food, nausea recommend trial of Protonix  40 mg on empty stomach for 2 weeks then prn daily after that. Keep symptom journal. Reevaluate in 4-6 weeks with symptom journal.   Orders: -     Pantoprazole  Sodium; Take 1 tablet (40 mg total) by mouth daily.  Dispense: 30 tablet; Refill: 0  Type 2 diabetes mellitus with complications [facility]) Assessment & Plan: Reviewed patient's home BG reading. No episodes of hypoglycemia over the last 2 weeks. She has not been on Ozempic  for about a month as she had problem with getting prescription refilled.  Recommend repeat A1c and restart Ozempic  at 0.25 mg weekly Has home BG monitor she gets through work.  Orders: -     Hemoglobin A1c -     Semaglutide (0.25 or 0.5MG /DOS); Inject 0.25 mg into the skin once a week.  Dispense: 3 mL; Refill: 2  Hyperlipidemia associated with type 2 diabetes mellitus  (HCC) Assessment & Plan: Emphasized statin importance for diabetes and cardiovascular risk. LDL goal <70 mg/dL. Continue Crestor  10 mg daily, repeat lipid panel today.   Orders: -     Lipid panel  Hypertension associated with diabetes (HCC) Assessment & Plan: BP stable, continue Losartan  25 mg daily, check CMP today.   Orders: -     Comprehensive metabolic panel with GFR  BPPV (benign paroxysmal positional vertigo), unspecified laterality Assessment & Plan: Reproducible brief episode of horizontal nystagmus on exam today on Dix-Hallpike. Discussed vestibular therapy benefit. Patient declined referral. Trial of home vestibular therapy, exercise printed and given. F/u in 4-6 weeks.      PCOS (polycystic ovarian syndrome) Assessment & Plan: Does not recall when she was diagnosed with this. But has chronic irregular, heavy menses. Does not want IUD/Nexplanon. Currently on Micronor  0.35 mg daily, continue. Discussed benefit of ob/gyn evaluaiton, potential for repeat ultrasound. Patient declines further evaluation into this and referral to ob/gyn due to cost. Will continue to reevaluate. Does not need refill since her last PCP sent prescription for one year. If concerns with refill patient recommended to reach out to our clinic.  Schedule Pap smear and annual exam.    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 Patient declined rheumatology evaluation due to cost. Prescribe ketoconazole  shampoo twice weekly. Monitor symptom improvement.   Orders: -     Ketoconazole ; Apply 1 Application topically 2 (two) times a week.  Dispense: 120 mL; Refill: 2    Return in about 4 weeks (around 03/29/2024) for For annual physical, PAP.   Mae Cianci  Dorothe Elmore, MD

## 2024-03-01 NOTE — Assessment & Plan Note (Signed)
 Psoriatic plaque like lesion on scalp, chronic. Patient declined rheumatology evaluation due to cost. Prescribe ketoconazole  shampoo twice weekly. Monitor symptom improvement.

## 2024-03-01 NOTE — Assessment & Plan Note (Signed)
 Reproducible brief episode of horizontal nystagmus on exam today on Dix-Hallpike. Discussed vestibular therapy benefit. Patient declined referral. Trial of home vestibular therapy, exercise printed and given. F/u in 4-6 weeks.

## 2024-03-01 NOTE — Assessment & Plan Note (Signed)
 Given recent use of Meloxicam , hiccups with food, nausea recommend trial of Protonix  40 mg on empty stomach for 2 weeks then prn daily after that. Keep symptom journal. Reevaluate in 4-6 weeks with symptom journal.

## 2024-03-01 NOTE — Assessment & Plan Note (Signed)
 Does not recall when she was diagnosed with this. But has chronic irregular, heavy menses. Does not want IUD/Nexplanon. Currently on Micronor  0.35 mg daily, continue. Discussed benefit of ob/gyn evaluaiton, potential for repeat ultrasound. Patient declines further evaluation into this and referral to ob/gyn due to cost. Will continue to reevaluate. Does not need refill since her last PCP sent prescription for one year. If concerns with refill patient recommended to reach out to our clinic.  Schedule Pap smear and annual exam.

## 2024-03-01 NOTE — Assessment & Plan Note (Signed)
 Reviewed patient's home BG reading. No episodes of hypoglycemia over the last 2 weeks. She has not been on Ozempic  for about a month as she had problem with getting prescription refilled.  Recommend repeat A1c and restart Ozempic  at 0.25 mg weekly Has home BG monitor she gets through work.

## 2024-03-01 NOTE — Assessment & Plan Note (Signed)
 Emphasized statin importance for diabetes and cardiovascular risk. LDL goal <70 mg/dL. Continue Crestor  10 mg daily, repeat lipid panel today.

## 2024-03-02 ENCOUNTER — Ambulatory Visit: Payer: Self-pay

## 2024-03-02 LAB — COMPREHENSIVE METABOLIC PANEL WITH GFR
ALT: 25 U/L (ref 0–35)
AST: 19 U/L (ref 0–37)
Albumin: 4.4 g/dL (ref 3.5–5.2)
Alkaline Phosphatase: 63 U/L (ref 39–117)
BUN: 12 mg/dL (ref 6–23)
CO2: 25 meq/L (ref 19–32)
Calcium: 9.4 mg/dL (ref 8.4–10.5)
Chloride: 103 meq/L (ref 96–112)
Creatinine, Ser: 0.61 mg/dL (ref 0.40–1.20)
GFR: 110.31 mL/min (ref >60.00)
Glucose, Bld: 125 mg/dL — ABNORMAL HIGH (ref 70–99)
Potassium: 4.7 meq/L (ref 3.5–5.1)
Sodium: 137 meq/L (ref 135–145)
Total Bilirubin: 1.2 mg/dL (ref 0.2–1.2)
Total Protein: 7.3 g/dL (ref 6.0–8.3)

## 2024-03-02 LAB — LIPID PANEL
Cholesterol: 116 mg/dL (ref 0–200)
HDL: 32.5 mg/dL — ABNORMAL LOW (ref >39.00)
LDL Cholesterol: 61 mg/dL (ref 0–99)
NonHDL: 83.78
Total CHOL/HDL Ratio: 4
Triglycerides: 115 mg/dL (ref 0.0–149.0)
VLDL: 23 mg/dL (ref 0.0–40.0)

## 2024-03-02 LAB — HEMOGLOBIN A1C: Hgb A1c MFr Bld: 8.1 % — ABNORMAL HIGH (ref 4.6–6.5)

## 2024-03-02 NOTE — Progress Notes (Signed)
 Updated patient on her lab results. Elevation in A1c likely multifactorial: on steroid previously, ran out of Ozempic . She had picked up prescription for Ozempic , will stay on 0.25 mg weekly for about 8 weeks then will go up to 0.5 mg weekly. Repeat A1c in 3 months. Continue rest of the medication without change.   Luke Shade, MD

## 2024-03-22 DIAGNOSIS — E1165 Type 2 diabetes mellitus with hyperglycemia: Secondary | ICD-10-CM | POA: Diagnosis not present

## 2024-03-22 DIAGNOSIS — R202 Paresthesia of skin: Secondary | ICD-10-CM | POA: Diagnosis not present

## 2024-03-22 DIAGNOSIS — M7521 Bicipital tendinitis, right shoulder: Secondary | ICD-10-CM | POA: Diagnosis not present

## 2024-03-23 ENCOUNTER — Other Ambulatory Visit: Payer: Self-pay

## 2024-03-23 DIAGNOSIS — K219 Gastro-esophageal reflux disease without esophagitis: Secondary | ICD-10-CM

## 2024-03-30 DIAGNOSIS — M25511 Pain in right shoulder: Secondary | ICD-10-CM | POA: Diagnosis not present

## 2024-04-03 DIAGNOSIS — M7541 Impingement syndrome of right shoulder: Secondary | ICD-10-CM | POA: Diagnosis not present

## 2024-04-03 DIAGNOSIS — M7551 Bursitis of right shoulder: Secondary | ICD-10-CM | POA: Diagnosis not present

## 2024-04-03 DIAGNOSIS — M7521 Bicipital tendinitis, right shoulder: Secondary | ICD-10-CM | POA: Diagnosis not present

## 2024-04-03 DIAGNOSIS — E119 Type 2 diabetes mellitus without complications: Secondary | ICD-10-CM | POA: Diagnosis not present

## 2024-04-12 ENCOUNTER — Other Ambulatory Visit (HOSPITAL_COMMUNITY)
Admission: RE | Admit: 2024-04-12 | Discharge: 2024-04-12 | Disposition: A | Discharge status: Home / Self Care | Source: Ambulatory Visit

## 2024-04-12 ENCOUNTER — Ambulatory Visit

## 2024-04-12 VITALS — BP 110/70 | HR 73 | Temp 98.7°F | Ht 64.0 in | Wt 165.6 lb

## 2024-04-12 DIAGNOSIS — E785 Hyperlipidemia, unspecified: Secondary | ICD-10-CM

## 2024-04-12 DIAGNOSIS — E1169 Type 2 diabetes mellitus with other specified complication: Secondary | ICD-10-CM

## 2024-04-12 DIAGNOSIS — Z1151 Encounter for screening for human papillomavirus (HPV): Secondary | ICD-10-CM | POA: Insufficient documentation

## 2024-04-12 DIAGNOSIS — I152 Hypertension secondary to endocrine disorders: Secondary | ICD-10-CM

## 2024-04-12 DIAGNOSIS — Z124 Encounter for screening for malignant neoplasm of cervix: Secondary | ICD-10-CM | POA: Insufficient documentation

## 2024-04-12 DIAGNOSIS — E282 Polycystic ovarian syndrome: Secondary | ICD-10-CM | POA: Diagnosis not present

## 2024-04-12 DIAGNOSIS — E1159 Type 2 diabetes mellitus with other circulatory complications: Secondary | ICD-10-CM | POA: Diagnosis not present

## 2024-04-12 DIAGNOSIS — E118 Type 2 diabetes mellitus with unspecified complications: Secondary | ICD-10-CM

## 2024-04-12 DIAGNOSIS — Z0000 Encounter for general adult medical examination without abnormal findings: Secondary | ICD-10-CM

## 2024-04-12 DIAGNOSIS — Z7985 Long-term (current) use of injectable non-insulin antidiabetic drugs: Secondary | ICD-10-CM

## 2024-04-12 DIAGNOSIS — Z1231 Encounter for screening mammogram for malignant neoplasm of breast: Secondary | ICD-10-CM | POA: Insufficient documentation

## 2024-04-12 MED ORDER — LOSARTAN POTASSIUM 25 MG PO TABS: 25.0000 mg | ORAL_TABLET | Freq: Every evening | ORAL | 3 refills | Status: AC

## 2024-04-12 MED ORDER — NORETHINDRONE 0.35 MG PO TABS: 1.0000 | ORAL_TABLET | Freq: Every day | ORAL | 11 refills | Status: AC

## 2024-04-12 MED ORDER — ROSUVASTATIN CALCIUM 10 MG PO TABS: 10.0000 mg | ORAL_TABLET | Freq: Every evening | ORAL | 3 refills | Status: AC

## 2024-04-12 NOTE — Patient Instructions (Addendum)
 YOUR MAMMOGRAM IS DUE, PLEASE CALL AND GET THIS SCHEDULED! [facility] - call [phone]    Recommend annual eye exam.    Follow up in 3 months for type 2 diabetes.

## 2024-04-12 NOTE — Assessment & Plan Note (Addendum)
-   Continue Micronor  0.35 mg daily. Refill sent.  Orders:   norethindrone  (MICRONOR ) 0.35 MG tablet; Take 1 tablet (0.35 mg total) by mouth daily.

## 2024-04-12 NOTE — Assessment & Plan Note (Addendum)
-   BP in goal, continue Losartan  25 mg daily.  Orders:   losartan  (COZAAR ) 25 MG tablet; Take 1 tablet (25 mg total) by mouth at bedtime.

## 2024-04-12 NOTE — Assessment & Plan Note (Addendum)
-   Continue Crestor  10 mg daily, goal LDL <70 Orders:   rosuvastatin  (CRESTOR ) 10 MG tablet; Take 1 tablet (10 mg total) by mouth every evening.

## 2024-04-12 NOTE — Assessment & Plan Note (Addendum)
 Plan per annual physical  Orders:   Cytology - PAP

## 2024-04-12 NOTE — Assessment & Plan Note (Addendum)
 Plan per annual physical Orders:   MM 3D SCREENING MAMMOGRAM BILATERAL BREAST; Future

## 2024-04-12 NOTE — Assessment & Plan Note (Addendum)
 Annual physical examination conducted. Screening mammogram ordered. Patient counseled to call to schedule an appointment.  - Urine sample collected to screen for diabetic proteinuria. - Recommended eye exam annually and annual physical exam as well.  - Keep healthy lifestyle.
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
## 2024-04-12 NOTE — Assessment & Plan Note (Addendum)
 Increased semaglutide  (Ozempic ) from 0.25 mg once weekly to 0.5 mg. Discussed potential gastrointestinal side effects. Emphasized importance of glycemic control prior to surgery of right shoulder. Date of surgery not set yet. Will obtain urine microalbumin level. Follow up in 3 months.  Orders:   Urine Microalbumin w/creat. ratio   HgB A1c; Future

## 2024-04-13 ENCOUNTER — Ambulatory Visit: Payer: Self-pay

## 2024-04-13 DIAGNOSIS — E1129 Type 2 diabetes mellitus with other diabetic kidney complication: Secondary | ICD-10-CM

## 2024-04-13 LAB — MICROALBUMIN / CREATININE URINE RATIO
Creatinine,U: 73.6 mg/dL
Microalb Creat Ratio: 41 mg/g — ABNORMAL HIGH (ref 0.0–30.0)
Microalb, Ur: 3 mg/dL — ABNORMAL HIGH (ref 0.0–1.9)

## 2024-04-14 LAB — CYTOLOGY - PAP
Comment: NEGATIVE
Diagnosis: NEGATIVE
High risk HPV: NEGATIVE

## 2024-04-26 NOTE — Telephone Encounter (Signed)
 open in error

## 2024-05-11 DIAGNOSIS — M65811 Other synovitis and tenosynovitis, right shoulder: Secondary | ICD-10-CM | POA: Diagnosis not present

## 2024-05-11 DIAGNOSIS — M7521 Bicipital tendinitis, right shoulder: Secondary | ICD-10-CM | POA: Diagnosis not present

## 2024-05-11 DIAGNOSIS — G8918 Other acute postprocedural pain: Secondary | ICD-10-CM | POA: Diagnosis not present

## 2024-05-11 DIAGNOSIS — M7541 Impingement syndrome of right shoulder: Secondary | ICD-10-CM | POA: Diagnosis not present

## 2024-05-11 DIAGNOSIS — M24111 Other articular cartilage disorders, right shoulder: Secondary | ICD-10-CM | POA: Diagnosis not present

## 2024-05-15 DIAGNOSIS — M7541 Impingement syndrome of right shoulder: Secondary | ICD-10-CM | POA: Diagnosis not present

## 2024-05-15 DIAGNOSIS — S46111A Strain of muscle, fascia and tendon of long head of biceps, right arm, initial encounter: Secondary | ICD-10-CM | POA: Diagnosis not present

## 2024-05-15 DIAGNOSIS — M6281 Muscle weakness (generalized): Secondary | ICD-10-CM | POA: Diagnosis not present

## 2024-05-15 DIAGNOSIS — M25611 Stiffness of right shoulder, not elsewhere classified: Secondary | ICD-10-CM | POA: Diagnosis not present

## 2024-05-25 DIAGNOSIS — M25611 Stiffness of right shoulder, not elsewhere classified: Secondary | ICD-10-CM | POA: Diagnosis not present

## 2024-05-25 DIAGNOSIS — M7541 Impingement syndrome of right shoulder: Secondary | ICD-10-CM | POA: Diagnosis not present

## 2024-05-25 DIAGNOSIS — S46111D Strain of muscle, fascia and tendon of long head of biceps, right arm, subsequent encounter: Secondary | ICD-10-CM | POA: Diagnosis not present

## 2024-05-25 DIAGNOSIS — M6281 Muscle weakness (generalized): Secondary | ICD-10-CM | POA: Diagnosis not present

## 2024-05-30 DIAGNOSIS — M6281 Muscle weakness (generalized): Secondary | ICD-10-CM | POA: Diagnosis not present

## 2024-05-30 DIAGNOSIS — M7541 Impingement syndrome of right shoulder: Secondary | ICD-10-CM | POA: Diagnosis not present

## 2024-05-30 DIAGNOSIS — S46111D Strain of muscle, fascia and tendon of long head of biceps, right arm, subsequent encounter: Secondary | ICD-10-CM | POA: Diagnosis not present

## 2024-05-30 DIAGNOSIS — M25611 Stiffness of right shoulder, not elsewhere classified: Secondary | ICD-10-CM | POA: Diagnosis not present

## 2024-06-06 DIAGNOSIS — M7541 Impingement syndrome of right shoulder: Secondary | ICD-10-CM | POA: Diagnosis not present

## 2024-06-06 DIAGNOSIS — M6281 Muscle weakness (generalized): Secondary | ICD-10-CM | POA: Diagnosis not present

## 2024-06-06 DIAGNOSIS — M25611 Stiffness of right shoulder, not elsewhere classified: Secondary | ICD-10-CM | POA: Diagnosis not present

## 2024-06-06 DIAGNOSIS — S46111D Strain of muscle, fascia and tendon of long head of biceps, right arm, subsequent encounter: Secondary | ICD-10-CM | POA: Diagnosis not present

## 2024-06-18 ENCOUNTER — Other Ambulatory Visit: Payer: Self-pay

## 2024-06-18 DIAGNOSIS — E118 Type 2 diabetes mellitus with unspecified complications: Secondary | ICD-10-CM

## 2024-08-10 ENCOUNTER — Ambulatory Visit
